# Patient Record
Sex: Female | Born: 1950 | Race: White | Hispanic: No | Marital: Married | Smoking: Never smoker
Health system: Southern US, Community
[De-identification: ages and names within clinical notes are randomized; demographics above are authoritative.]

## PROBLEM LIST (undated history)

## (undated) DIAGNOSIS — M797 Fibromyalgia: Secondary | ICD-10-CM

## (undated) DIAGNOSIS — I951 Orthostatic hypotension: Secondary | ICD-10-CM

## (undated) DIAGNOSIS — I639 Cerebral infarction, unspecified: Secondary | ICD-10-CM

## (undated) DIAGNOSIS — I10 Essential (primary) hypertension: Secondary | ICD-10-CM

## (undated) DIAGNOSIS — Z9889 Other specified postprocedural states: Secondary | ICD-10-CM

## (undated) DIAGNOSIS — G459 Transient cerebral ischemic attack, unspecified: Secondary | ICD-10-CM

## (undated) DIAGNOSIS — M1990 Unspecified osteoarthritis, unspecified site: Secondary | ICD-10-CM

## (undated) DIAGNOSIS — Z9289 Personal history of other medical treatment: Secondary | ICD-10-CM

## (undated) DIAGNOSIS — K649 Unspecified hemorrhoids: Secondary | ICD-10-CM

## (undated) DIAGNOSIS — E669 Obesity, unspecified: Secondary | ICD-10-CM

## (undated) DIAGNOSIS — K635 Polyp of colon: Secondary | ICD-10-CM

## (undated) DIAGNOSIS — H8110 Benign paroxysmal vertigo, unspecified ear: Secondary | ICD-10-CM

## (undated) DIAGNOSIS — E785 Hyperlipidemia, unspecified: Secondary | ICD-10-CM

## (undated) DIAGNOSIS — R112 Nausea with vomiting, unspecified: Secondary | ICD-10-CM

## (undated) DIAGNOSIS — K259 Gastric ulcer, unspecified as acute or chronic, without hemorrhage or perforation: Secondary | ICD-10-CM

## (undated) DIAGNOSIS — F32A Depression, unspecified: Secondary | ICD-10-CM

## (undated) DIAGNOSIS — E119 Type 2 diabetes mellitus without complications: Secondary | ICD-10-CM

## (undated) DIAGNOSIS — Q992 Fragile X chromosome: Secondary | ICD-10-CM

## (undated) DIAGNOSIS — R569 Unspecified convulsions: Secondary | ICD-10-CM

## (undated) DIAGNOSIS — F329 Major depressive disorder, single episode, unspecified: Secondary | ICD-10-CM

## (undated) DIAGNOSIS — G119 Hereditary ataxia, unspecified: Secondary | ICD-10-CM

## (undated) DIAGNOSIS — J189 Pneumonia, unspecified organism: Secondary | ICD-10-CM

## (undated) DIAGNOSIS — F419 Anxiety disorder, unspecified: Secondary | ICD-10-CM

## (undated) DIAGNOSIS — N6019 Diffuse cystic mastopathy of unspecified breast: Secondary | ICD-10-CM

## (undated) HISTORY — DX: Hereditary ataxia, unspecified: G11.9

## (undated) HISTORY — DX: Polyp of colon: K63.5

## (undated) HISTORY — DX: Hyperlipidemia, unspecified: E78.5

## (undated) HISTORY — DX: Fibromyalgia: M79.7

## (undated) HISTORY — DX: Pneumonia, unspecified organism: J18.9

## (undated) HISTORY — DX: Other specified postprocedural states: Z98.890

## (undated) HISTORY — DX: Anxiety disorder, unspecified: F41.9

## (undated) HISTORY — DX: Depression, unspecified: F32.A

## (undated) HISTORY — DX: Major depressive disorder, single episode, unspecified: F32.9

## (undated) HISTORY — DX: Personal history of other medical treatment: Z92.89

## (undated) HISTORY — DX: Obesity, unspecified: E66.9

## (undated) HISTORY — PX: BREAST SURGERY: SHX581

## (undated) HISTORY — DX: Unspecified hemorrhoids: K64.9

## (undated) HISTORY — DX: Benign paroxysmal vertigo, unspecified ear: H81.10

## (undated) HISTORY — DX: Nausea with vomiting, unspecified: R11.2

## (undated) HISTORY — PX: FRACTURE SURGERY: SHX138

## (undated) HISTORY — PX: ABDOMINAL HYSTERECTOMY: SHX81

## (undated) HISTORY — DX: Gastric ulcer, unspecified as acute or chronic, without hemorrhage or perforation: K25.9

## (undated) HISTORY — DX: Unspecified osteoarthritis, unspecified site: M19.90

## (undated) HISTORY — DX: Transient cerebral ischemic attack, unspecified: G45.9

## (undated) HISTORY — PX: MASTECTOMY: SHX3

## (undated) HISTORY — DX: Essential (primary) hypertension: I10

---
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
## 2006-08-24 ENCOUNTER — Emergency Department (HOSPITAL_COMMUNITY): Admission: EM | Admit: 2006-08-24 | Discharge: 2006-08-24 | Payer: Self-pay | Admitting: Emergency Medicine

## 2010-08-24 ENCOUNTER — Emergency Department (INDEPENDENT_AMBULATORY_CARE_PROVIDER_SITE_OTHER): Payer: BC Managed Care – PPO

## 2010-08-24 ENCOUNTER — Emergency Department (HOSPITAL_BASED_OUTPATIENT_CLINIC_OR_DEPARTMENT_OTHER)
Admission: EM | Admit: 2010-08-24 | Discharge: 2010-08-24 | Disposition: A | Discharge status: Home / Self Care | Payer: BC Managed Care – PPO | Attending: Emergency Medicine | Admitting: Emergency Medicine

## 2010-08-24 DIAGNOSIS — R42 Dizziness and giddiness: Secondary | ICD-10-CM | POA: Insufficient documentation

## 2010-08-24 DIAGNOSIS — R197 Diarrhea, unspecified: Secondary | ICD-10-CM

## 2010-08-24 DIAGNOSIS — R5381 Other malaise: Secondary | ICD-10-CM | POA: Insufficient documentation

## 2010-08-24 DIAGNOSIS — R109 Unspecified abdominal pain: Secondary | ICD-10-CM | POA: Insufficient documentation

## 2010-08-24 DIAGNOSIS — R112 Nausea with vomiting, unspecified: Secondary | ICD-10-CM | POA: Insufficient documentation

## 2010-08-24 DIAGNOSIS — R10816 Epigastric abdominal tenderness: Secondary | ICD-10-CM | POA: Insufficient documentation

## 2010-08-24 DIAGNOSIS — M549 Dorsalgia, unspecified: Secondary | ICD-10-CM | POA: Insufficient documentation

## 2010-08-24 DIAGNOSIS — E860 Dehydration: Secondary | ICD-10-CM | POA: Insufficient documentation

## 2010-08-24 DIAGNOSIS — Z79899 Other long term (current) drug therapy: Secondary | ICD-10-CM | POA: Insufficient documentation

## 2010-08-24 DIAGNOSIS — G40909 Epilepsy, unspecified, not intractable, without status epilepticus: Secondary | ICD-10-CM | POA: Insufficient documentation

## 2010-08-24 DIAGNOSIS — K5289 Other specified noninfective gastroenteritis and colitis: Secondary | ICD-10-CM | POA: Insufficient documentation

## 2010-08-24 LAB — DIFFERENTIAL
Basophils Absolute: 0 10*3/uL (ref 0.0–0.1)
Basophils Relative: 0 % (ref 0–1)
Eosinophils Absolute: 0 10*3/uL (ref 0.0–0.7)
Eosinophils Relative: 0 % (ref 0–5)
Lymphocytes Relative: 6 % — ABNORMAL LOW (ref 12–46)

## 2010-08-24 LAB — POTASSIUM: Potassium: 5 mEq/L (ref 3.5–5.1)

## 2010-08-24 LAB — CBC
MCHC: 33.5 g/dL (ref 30.0–36.0)
Platelets: 210 10*3/uL (ref 150–400)
RDW: 13.5 % (ref 11.5–15.5)
WBC: 9 10*3/uL (ref 4.0–10.5)

## 2010-08-24 LAB — BASIC METABOLIC PANEL
Calcium: 8.8 mg/dL (ref 8.4–10.5)
GFR calc Af Amer: >60 mL/min (ref >60)
GFR calc non Af Amer: >60 mL/min (ref >60)
Potassium: 5.6 mEq/L — ABNORMAL HIGH (ref 3.5–5.1)
Sodium: 142 mEq/L (ref 135–145)

## 2011-02-27 DIAGNOSIS — M549 Dorsalgia, unspecified: Secondary | ICD-10-CM | POA: Insufficient documentation

## 2011-06-19 ENCOUNTER — Encounter: Payer: Self-pay | Admitting: *Deleted

## 2011-06-19 ENCOUNTER — Emergency Department (HOSPITAL_BASED_OUTPATIENT_CLINIC_OR_DEPARTMENT_OTHER)
Admission: EM | Admit: 2011-06-19 | Discharge: 2011-06-19 | Disposition: A | Discharge status: Home / Self Care | Payer: Medicare Other | Attending: Emergency Medicine | Admitting: Emergency Medicine

## 2011-06-19 DIAGNOSIS — R21 Rash and other nonspecific skin eruption: Secondary | ICD-10-CM | POA: Insufficient documentation

## 2011-06-19 DIAGNOSIS — Z79899 Other long term (current) drug therapy: Secondary | ICD-10-CM | POA: Insufficient documentation

## 2011-06-19 DIAGNOSIS — D689 Coagulation defect, unspecified: Secondary | ICD-10-CM | POA: Insufficient documentation

## 2011-06-19 DIAGNOSIS — K130 Diseases of lips: Secondary | ICD-10-CM

## 2011-06-19 HISTORY — DX: Unspecified convulsions: R56.9

## 2011-06-19 MED ORDER — SILVER NITRATE-POT NITRATE 75-25 % EX MISC
CUTANEOUS | Status: AC
  Administered 2011-06-19: 1 via TOPICAL
  Filled 2011-06-19: qty 1

## 2011-06-19 MED ORDER — SILVER NITRATE-POT NITRATE 75-25 % EX MISC
1.0000 | Freq: Once | CUTANEOUS | Status: AC
  Administered 2011-06-19: 1 via TOPICAL

## 2011-06-19 NOTE — ED Provider Notes (Signed)
History     CSN: [id number]  Arrival date & time 06/19/11  2017   First MD Initiated Contact with Patient 06/19/11 2102      Chief Complaint  Patient presents with  . Coagulation Disorder    (Consider location/radiation/quality/duration/timing/severity/associated sxs/prior treatment) Patient is a 60 y.o. female presenting with mouth sores. The history is provided by the patient. No language interpreter was used.  Mouth Lesions  The current episode started today. The onset was sudden. The problem occurs occasionally. The problem has been unchanged. The problem is moderate. Associated symptoms include mouth sores, URI and rash.  Pt reports she has a blood vessel on her lip that bleeds on and off.  Pt has seen dermatologist for the same.    Past Medical History  Diagnosis Date  . Seizures     Past Surgical History  Procedure Date  . Abdominal hysterectomy   . Fracture surgery     No family history on file.  History  Substance Use Topics  . Smoking status: Never Smoker   . Smokeless tobacco: Not on file  . Alcohol Use: No    OB History    Grav Para Term Preterm Abortions TAB SAB Ect Mult Living                  Review of Systems  HENT: Positive for mouth sores.   Skin: Positive for rash.  All other systems reviewed and are negative.    Allergies  Demerol; Dilantin; Morphine and related; and Talwin  Home Medications   Current Outpatient Rx  Name Route Sig Dispense Refill  . ATORVASTATIN CALCIUM 80 MG PO TABS Oral Take 80 mg by mouth daily.      [name] CLONAZEPAM 1 MG PO TABS Oral Take 1 mg by mouth 3 (three) times daily as needed.      [name] ESCITALOPRAM OXALATE 10 MG PO TABS Oral Take 10 mg by mouth daily.      [name] METOPROLOL TARTRATE 25 MG PO TABS Oral Take 25 mg by mouth 2 (two) times daily.      [name] PANTOPRAZOLE SODIUM 20 MG PO TBEC Oral Take 20 mg by mouth daily.      [name] PHENOBARBITAL 100 MG PO TABS Oral Take 100 mg by mouth 2 (two) times daily.        BP 133/83   Pulse 80  Temp(Src) 97.9 F (36.6 C) (Oral)  Resp 18  Ht 5' 4.5" (1.638 m)  Wt 188 lb (85.276 kg)  BMI 31.77 kg/m2  SpO2 100%  Physical Exam  Nursing note and vitals reviewed. Constitutional: She appears well-developed and well-nourished.  HENT:  Head: Normocephalic and atraumatic.  Right Ear: External ear normal.  Left Ear: External ear normal.  Eyes: Conjunctivae and EOM are normal. Pupils are equal, round, and reactive to light.  Neck: Normal range of motion. Neck supple.  Neurological: She is alert.  Skin: There is erythema.  Psychiatric: She has a normal mood and affect.    ED Course  Procedures (including critical care time)  Labs Reviewed - No data to display No results found.   No diagnosis found.    MDM  Tiny dark blood blood vessel lower lip,  (tiny varicose vein)  Oozing,  Silver nitrate to area.  Bleeding resolved        Langston Masker, [place] 06/19/11 2121

## 2011-06-19 NOTE — ED Notes (Signed)
Pt presents to ED today with bleeding lip.  Pt has hx of same.  Pt reports has "open blood vessel" on lower lip and was treated at dermatologist for same. Pt was eating supper when sx started tonight

## 2011-06-20 ENCOUNTER — Encounter (HOSPITAL_BASED_OUTPATIENT_CLINIC_OR_DEPARTMENT_OTHER): Payer: Self-pay | Admitting: Emergency Medicine

## 2011-06-20 ENCOUNTER — Emergency Department (HOSPITAL_BASED_OUTPATIENT_CLINIC_OR_DEPARTMENT_OTHER)
Admission: EM | Admit: 2011-06-20 | Discharge: 2011-06-21 | Disposition: A | Discharge status: Home / Self Care | Payer: Medicare Other | Attending: Emergency Medicine | Admitting: Emergency Medicine

## 2011-06-20 DIAGNOSIS — Z79899 Other long term (current) drug therapy: Secondary | ICD-10-CM | POA: Insufficient documentation

## 2011-06-20 DIAGNOSIS — K130 Diseases of lips: Secondary | ICD-10-CM | POA: Insufficient documentation

## 2011-06-20 MED ORDER — HYDROCODONE-ACETAMINOPHEN 5-325 MG PO TABS
1.0000 | ORAL_TABLET | Freq: Once | ORAL | Status: AC
  Administered 2011-06-20: 1 via ORAL
  Filled 2011-06-20: qty 1

## 2011-06-20 NOTE — ED Notes (Signed)
Pt has lesion on lip with continued bleeding. Pt was seen here last night for same

## 2011-06-20 NOTE — ED Provider Notes (Signed)
Medical screening examination/treatment/procedure(s) were performed by non-physician practitioner and as supervising physician I was immediately available for consultation/collaboration.  Juliet Rude. Rubin Payor, MD 06/20/11 [phone]

## 2011-06-21 MED ORDER — SILVER NITRATE-POT NITRATE 75-25 % EX MISC
1.0000 | Freq: Once | CUTANEOUS | Status: AC
  Administered 2011-06-21: 1 via TOPICAL
  Filled 2011-06-21: qty 1

## 2011-06-21 NOTE — ED Provider Notes (Signed)
History     CSN: [id number]  Arrival date & time 06/20/11  2223   First MD Initiated Contact with Patient 06/20/11 2312      Chief Complaint  Patient presents with  . Lip Laceration    (Consider location/radiation/quality/duration/timing/severity/associated sxs/prior treatment) HPI  60yoF presents with bleeding from her lip. Patient states that she seen here for same yesterday. Silver nitrate was applied at that time. She states that today limited to bleeding. She's had a problem with this in the past. She states that she had a small lesion on her lip that was excised and cauterized by dermatology. She states that there was some abnormality in the blood vessel at that time. She's had no problems since the original procedure until yesterday. She denies any trauma to the lip. She complains of mild frontal headache. She states she has not eaten. She denies numbness, tingling, weakness of her injuries. The headache is to 10 at this time. There is a gradual onset and present for 2-3 days. Denies fever, chills, neck stiffness.  Past Medical History  Diagnosis Date  . Seizures     Past Surgical History  Procedure Date  . Abdominal hysterectomy   . Fracture surgery     No family history on file.  History  Substance Use Topics  . Smoking status: Never Smoker   . Smokeless tobacco: Not on file  . Alcohol Use: No    OB History    Grav Para Term Preterm Abortions TAB SAB Ect Mult Living                  Review of Systems  All other systems reviewed and are negative.   except as noted HPI   Allergies  Talwin; Demerol; Dilantin; Dilaudid; and Morphine and related  Home Medications   Current Outpatient Rx  Name Route Sig Dispense Refill  . ACETAMINOPHEN 500 MG PO TABS Oral Take 500 mg by mouth every 6 (six) hours as needed. For pain     . ATORVASTATIN CALCIUM 80 MG PO TABS Oral Take 80 mg by mouth daily.      [name] CLONAZEPAM 1 MG PO TABS Oral Take 1 mg by mouth 3 (three)  times daily as needed. For anxiety    . ESCITALOPRAM OXALATE 10 MG PO TABS Oral Take 10 mg by mouth daily.      [name] METOPROLOL SUCCINATE ER 25 MG PO TB24 Oral Take 25 mg by mouth at bedtime.      [name] PANTOPRAZOLE SODIUM 20 MG PO TBEC Oral Take 20 mg by mouth daily.      [name] PHENOBARBITAL 100 MG PO TABS Oral Take 100 mg by mouth at bedtime.       BP 111/70  Pulse 98  Temp(Src) 97.8 F (36.6 C) (Oral)  Resp 18  SpO2 98%  Physical Exam  Nursing note and vitals reviewed. Constitutional: She is oriented to person, place, and time. She appears well-developed.  HENT:  Head: Atraumatic.  Mouth/Throat: Oropharynx is clear and moist.  Eyes: Conjunctivae and EOM are normal. Pupils are equal, round, and reactive to light.  Neck: Normal range of motion. Neck supple.  Cardiovascular: Normal rate, regular rhythm, normal heart sounds and intact distal pulses.   Pulmonary/Chest: Effort normal and breath sounds normal. No respiratory distress. She has no wheezes. She has no rales.  Abdominal: Soft. She exhibits no distension. There is no tenderness. There is no rebound and no guarding.  Musculoskeletal: Normal range of motion.  Neurological: She is alert and oriented to person, place, and time.  Skin: Skin is warm and dry. No rash noted.       Lt lower lip with .3cm bleeding lesions. Min amt of oozing noted  Psychiatric: She has a normal mood and affect.    ED Course  Cauterization Date/Time: 06/21/2011 12:50 AM Performed by: Forbes Cellar Authorized by: Forbes Cellar Consent: Verbal consent obtained. Consent given by: patient Patient understanding: patient states understanding of the procedure being performed Patient consent: the patient's understanding of the procedure matches consent given Patient identity confirmed: arm band Time out: Immediately prior to procedure a "time out" was called to verify the correct patient, procedure, equipment, support staff and site/side marked as  required. Local anesthesia used: no Patient sedated: no Patient tolerance: Patient tolerated the procedure well with no immediate complications. Comments: Silver nitrate applied to oozing wound with good control of bleeding   (including critical care time)  Labs Reviewed - No data to display No results found.   1. Lip lesion     MDM  H/o vessel abnormality of lip pw repeated min amt of bleeding. Chemical cautery. She will f/u with her dermatologist for further w/u and evaluation. Headache slightly improved with norco. To take tylenol at home for same. Precautions for return        Forbes Cellar, MD 06/21/11 [phone]

## 2012-08-14 ENCOUNTER — Emergency Department (HOSPITAL_BASED_OUTPATIENT_CLINIC_OR_DEPARTMENT_OTHER)
Admission: EM | Admit: 2012-08-14 | Discharge: 2012-08-15 | Disposition: A | Discharge status: Home / Self Care | Payer: Medicare Other | Attending: Emergency Medicine | Admitting: Emergency Medicine

## 2012-08-14 ENCOUNTER — Emergency Department (HOSPITAL_BASED_OUTPATIENT_CLINIC_OR_DEPARTMENT_OTHER): Payer: Medicare Other

## 2012-08-14 ENCOUNTER — Encounter (HOSPITAL_BASED_OUTPATIENT_CLINIC_OR_DEPARTMENT_OTHER): Payer: Self-pay | Admitting: Emergency Medicine

## 2012-08-14 DIAGNOSIS — R0989 Other specified symptoms and signs involving the circulatory and respiratory systems: Secondary | ICD-10-CM | POA: Insufficient documentation

## 2012-08-14 DIAGNOSIS — Z79899 Other long term (current) drug therapy: Secondary | ICD-10-CM | POA: Insufficient documentation

## 2012-08-14 DIAGNOSIS — R110 Nausea: Secondary | ICD-10-CM | POA: Insufficient documentation

## 2012-08-14 DIAGNOSIS — E119 Type 2 diabetes mellitus without complications: Secondary | ICD-10-CM | POA: Insufficient documentation

## 2012-08-14 DIAGNOSIS — R569 Unspecified convulsions: Secondary | ICD-10-CM | POA: Insufficient documentation

## 2012-08-14 DIAGNOSIS — R0609 Other forms of dyspnea: Secondary | ICD-10-CM | POA: Insufficient documentation

## 2012-08-14 DIAGNOSIS — R509 Fever, unspecified: Secondary | ICD-10-CM | POA: Insufficient documentation

## 2012-08-14 DIAGNOSIS — B9789 Other viral agents as the cause of diseases classified elsewhere: Secondary | ICD-10-CM | POA: Insufficient documentation

## 2012-08-14 DIAGNOSIS — R5381 Other malaise: Secondary | ICD-10-CM | POA: Insufficient documentation

## 2012-08-14 DIAGNOSIS — R059 Cough, unspecified: Secondary | ICD-10-CM | POA: Insufficient documentation

## 2012-08-14 DIAGNOSIS — R61 Generalized hyperhidrosis: Secondary | ICD-10-CM | POA: Insufficient documentation

## 2012-08-14 DIAGNOSIS — IMO0001 Reserved for inherently not codable concepts without codable children: Secondary | ICD-10-CM | POA: Insufficient documentation

## 2012-08-14 HISTORY — DX: Type 2 diabetes mellitus without complications: E11.9

## 2012-08-14 LAB — CBC WITH DIFFERENTIAL/PLATELET
Basophils Absolute: 0 10*3/uL (ref 0.0–0.1)
Eosinophils Relative: 1 % (ref 0–5)
HCT: 46.3 % — ABNORMAL HIGH (ref 36.0–46.0)
Hemoglobin: 15.2 g/dL — ABNORMAL HIGH (ref 12.0–15.0)
Lymphocytes Relative: 12 % (ref 12–46)
MCV: 85.7 fL (ref 78.0–100.0)
Monocytes Absolute: 0.7 10*3/uL (ref 0.1–1.0)
Monocytes Relative: 11 % (ref 3–12)
Neutro Abs: 4.9 10*3/uL (ref 1.7–7.7)
RDW: 14.2 % (ref 11.5–15.5)
WBC: 6.5 10*3/uL (ref 4.0–10.5)

## 2012-08-14 LAB — BASIC METABOLIC PANEL
BUN: 15 mg/dL (ref 6–23)
CO2: 25 mEq/L (ref 19–32)
Calcium: 9 mg/dL (ref 8.4–10.5)
Chloride: 104 mEq/L (ref 96–112)
Creatinine, Ser: 0.9 mg/dL (ref 0.50–1.10)
Glucose, Bld: 112 mg/dL — ABNORMAL HIGH (ref 70–99)

## 2012-08-14 LAB — URINALYSIS, ROUTINE W REFLEX MICROSCOPIC
Bilirubin Urine: NEGATIVE
Nitrite: NEGATIVE
Protein, ur: 30 mg/dL — AB
Specific Gravity, Urine: 1.028 (ref 1.005–1.030)
Urobilinogen, UA: 0.2 mg/dL (ref 0.0–1.0)

## 2012-08-14 LAB — URINE MICROSCOPIC-ADD ON

## 2012-08-14 MED ORDER — ALBUTEROL SULFATE (5 MG/ML) 0.5% IN NEBU
INHALATION_SOLUTION | RESPIRATORY_TRACT | Status: AC
  Filled 2012-08-14: qty 1

## 2012-08-14 MED ORDER — LEVALBUTEROL HCL 1.25 MG/0.5ML IN NEBU
INHALATION_SOLUTION | RESPIRATORY_TRACT | Status: AC
  Administered 2012-08-14: 1.25 mg
  Filled 2012-08-14: qty 0.5

## 2012-08-14 MED ORDER — ONDANSETRON HCL 8 MG PO TABS
ORAL_TABLET | ORAL | Status: AC
  Administered 2012-08-14: 8 mg
  Filled 2012-08-14: qty 1

## 2012-08-14 MED ORDER — IPRATROPIUM BROMIDE 0.02 % IN SOLN
RESPIRATORY_TRACT | Status: AC
  Filled 2012-08-14: qty 2.5

## 2012-08-14 MED ORDER — IPRATROPIUM BROMIDE 0.02 % IN SOLN
RESPIRATORY_TRACT | Status: AC
  Administered 2012-08-14: 0.5 mg
  Filled 2012-08-14: qty 2.5

## 2012-08-14 MED ORDER — SODIUM CHLORIDE 0.9 % IV BOLUS (SEPSIS): 1000.0000 mL | Freq: Once | INTRAVENOUS | Status: DC

## 2012-08-14 MED ORDER — ASPIRIN 81 MG PO CHEW
162.0000 mg | CHEWABLE_TABLET | Freq: Once | ORAL | Status: AC
  Administered 2012-08-14: 162 mg via ORAL
  Filled 2012-08-14: qty 1

## 2012-08-14 NOTE — ED Notes (Signed)
I placed patient on wall monitor per nurse request. O2 stats showed 93-95% room air, so I placed a nasal canula on 2 ltrs. O2 and notified respiratory-Sarah.

## 2012-08-14 NOTE — ED Notes (Addendum)
Pt c/o generalized body aches and fever with cough and severe SOB onset 4 days ago and has progressively worsen denies chest pain currently taking Z-pack for same

## 2012-08-14 NOTE — ED Notes (Signed)
MD at bedside. 

## 2012-08-14 NOTE — ED Provider Notes (Addendum)
History    This chart was scribed for Derwood Kaplan, MD by Leone Payor, ED Scribe. This patient was seen in room MH12/[facility]12 and the patient's care was started 8:40 PM.   CSN: [id number]  Arrival date & time 08/14/12  2017   First MD Initiated Contact with Patient 08/14/12 2036      Chief Complaint  Patient presents with  . Shortness of Breath  . Cough  . Fatigue     The history is provided by the patient. No language interpreter was used.    Brandi Ramsey is a 62 y.o. female who presents to the Emergency Department complaining of ongoing, constant, unchanged SOB with associated generalized body aches, fever and cough productive of yellow/green sputum starting 5 days ago. Started taking Z-pack 4 days ago with no relief. Pt states 4 days ago she reports bending down and falling over nearly passing out. She also has associated nausea, chills, hot flashes. She denies vomiting, chest pain. Pt denies any heart problems or h/o lung disease or COPD. Denies h/o cancer, blood clots in legs or lungs. No recent surgeries or travels in the last 6 weeks. Pt reports using 3 pillows to sleep on.     Pt has h/o seizures, DM,  Pt is a current everyday smoker and occasional alcohol user.  Past Medical History  Diagnosis Date  . Seizures   . Diabetes mellitus without complication     Past Surgical History  Procedure Laterality Date  . Abdominal hysterectomy    . Fracture surgery      History reviewed. No pertinent family history.  History  Substance Use Topics  . Smoking status: Never Smoker   . Smokeless tobacco: Not on file  . Alcohol Use: No    No OB history provided.   Review of Systems  Constitutional: Positive for fever, chills, diaphoresis and fatigue.  HENT: Negative.   Eyes: Negative.   Respiratory: Positive for cough.   Cardiovascular: Negative.  Negative for chest pain.  Gastrointestinal: Positive for nausea. Negative for vomiting.  Musculoskeletal: Positive for myalgias.   Neurological: Negative.   Psychiatric/Behavioral: Negative.   All other systems reviewed and are negative.    Allergies  Pentazocine lactate; Demerol; Dilaudid; Morphine and related; and Phenytoin sodium extended  Home Medications   Current Outpatient Rx  Name  Route  Sig  Dispense  Refill  . acetaminophen (TYLENOL) 500 MG tablet   Oral   Take 500 mg by mouth every 6 (six) hours as needed. For pain          . atorvastatin (LIPITOR) 80 MG tablet   Oral   Take 80 mg by mouth daily.           . clonazePAM (KLONOPIN) 1 MG tablet   Oral   Take 1 mg by mouth 3 (three) times daily as needed. For anxiety         . escitalopram (LEXAPRO) 10 MG tablet   Oral   Take 10 mg by mouth daily.           . metoprolol succinate (TOPROL-XL) 25 MG 24 hr tablet   Oral   Take 25 mg by mouth at bedtime.           . pantoprazole (PROTONIX) 20 MG tablet   Oral   Take 20 mg by mouth daily.           [name] PHENObarbital (LUMINAL) 100 MG tablet   Oral   Take 100 mg by mouth  at bedtime.            BP 128/28  Pulse 129  Temp(Src) 99 F (37.2 C) (Oral)  SpO2 96%  Physical Exam  Nursing note and vitals reviewed. Constitutional: She is oriented to person, place, and time. She appears well-developed and well-nourished. No distress.  HENT:  Head: Normocephalic and atraumatic.  Eyes: EOM are normal.  Neck: Neck supple. No JVD present. No tracheal deviation present.  Cardiovascular: Regular rhythm and normal heart sounds.  Tachycardia present.   Pulmonary/Chest: Effort normal and breath sounds normal. No respiratory distress. She has no wheezes. She has no rales.  Abdominal: Soft. She exhibits no distension. There is no tenderness.  Musculoskeletal: Normal range of motion. She exhibits no edema.  Lower extremity has no pitting edema or unilateral swelling.  Neurological: She is alert and oriented to person, place, and time.  Skin: Skin is warm and dry.  Psychiatric: She has a  normal mood and affect. Her behavior is normal.    ED Course  Procedures (including critical care time)  DIAGNOSTIC STUDIES: Oxygen Saturation is 96% on room air, adequate by my interpretation.    COORDINATION OF CARE: 8:39 PM Discussed treatment plan which includes CXR, UA, CBC panel, basic metabolic panel, troponin I with pt at bedside and pt agreed to plan.    Labs Reviewed  URINALYSIS, ROUTINE W REFLEX MICROSCOPIC - Abnormal; Notable for the following:    Hgb urine dipstick TRACE (*)    Ketones, ur 15 (*)    Protein, ur 30 (*)    All other components within normal limits  URINE MICROSCOPIC-ADD ON - Abnormal; Notable for the following:    Squamous Epithelial / LPF FEW (*)    Bacteria, UA FEW (*)    All other components within normal limits   No results found.   No diagnosis found.    MDM  I personally performed the services described in this documentation, which was scribed in my presence. The recorded information has been reviewed and is accurate.   Date: 08/14/2012  Rate: 108  Rhythm: sinus tachycardia  QRS Axis: normal  Intervals: normal  ST/T Wave abnormalities: nonspecific ST/T changes  Conduction Disutrbances:none  Narrative Interpretation:   Old EKG Reviewed: none available S1, q3, t3 seen.  Differential diagnosis includes: ACS syndrome CHF exacerbation Valvular disorder Myocarditis Pericarditis Pericardial effusion Pneumonia Pleural effusion Pulmonary edema PE Anemia PE   Pt comes in with cc of dib. Pt noted to be tachycardic. She is on AZT - presumably for bronchitis, and some wheezing was appreciated at arrival by the RNs and RTs.  Plan is to get basic labs and CXR. If there is no PNA -  And patient continues to be tachycardic - we will get dimer. Her Wells score is 1.5 for tachycardia.   Pt had a near syncope/syncopal episode of Monday - since then fine. EKG is not showing any conduction delays or pathology, no repeat incident - so we  will not be further concerned about the near syncope in this visit.    Derwood Kaplan, MD 08/14/12 1478  Derwood Kaplan, MD 08/14/12 2356

## 2012-08-15 MED ORDER — DOXYCYCLINE HYCLATE 100 MG PO TABS
100.0000 mg | ORAL_TABLET | Freq: Once | ORAL | Status: AC
  Administered 2012-08-15: 100 mg via ORAL
  Filled 2012-08-15: qty 1

## 2012-08-15 MED ORDER — ACETAMINOPHEN 325 MG PO TABS
ORAL_TABLET | ORAL | Status: AC
  Administered 2012-08-15: 650 mg
  Filled 2012-08-15: qty 2

## 2012-08-15 MED ORDER — IOHEXOL 350 MG/ML SOLN
100.0000 mL | Freq: Once | INTRAVENOUS | Status: AC | PRN
  Administered 2012-08-15: 100 mL via INTRAVENOUS

## 2012-08-15 MED ORDER — AZITHROMYCIN 250 MG PO TABS: ORAL_TABLET | ORAL | Status: DC

## 2012-08-15 MED ORDER — ALBUTEROL SULFATE HFA 108 (90 BASE) MCG/ACT IN AERS: 1.0000 | INHALATION_SPRAY | Freq: Four times a day (QID) | RESPIRATORY_TRACT | Status: DC | PRN

## 2012-08-15 MED ORDER — DOXYCYCLINE HYCLATE 100 MG PO CAPS: 100.0000 mg | ORAL_CAPSULE | Freq: Two times a day (BID) | ORAL | Status: DC

## 2012-08-15 NOTE — ED Provider Notes (Signed)
Nursing notes and vitals signs, including pulse oximetry, reviewed.  Summary of this visit's results, reviewed by myself:  Labs:  Results for orders placed during the hospital encounter of 08/14/12 (from the past 24 hour(s))  URINALYSIS, ROUTINE W REFLEX MICROSCOPIC     Status: Abnormal   Collection Time    08/14/12  9:05 PM      Result Value Range   Color, Urine YELLOW  YELLOW   APPearance CLEAR  CLEAR   Specific Gravity, Urine 1.028  1.005 - 1.030   pH 5.5  5.0 - 8.0   Glucose, UA NEGATIVE  NEGATIVE mg/dL   Hgb urine dipstick TRACE (*) NEGATIVE   Bilirubin Urine NEGATIVE  NEGATIVE   Ketones, ur 15 (*) NEGATIVE mg/dL   Protein, ur 30 (*) NEGATIVE mg/dL   Urobilinogen, UA 0.2  0.0 - 1.0 mg/dL   Nitrite NEGATIVE  NEGATIVE   Leukocytes, UA NEGATIVE  NEGATIVE  URINE MICROSCOPIC-ADD ON     Status: Abnormal   Collection Time    08/14/12  9:05 PM      Result Value Range   Squamous Epithelial / LPF FEW (*) RARE   WBC, UA 0-2  <3 WBC/hpf   RBC / HPF 0-2  <3 RBC/hpf   Bacteria, UA FEW (*) RARE   Urine-Other MUCOUS PRESENT    D-DIMER, QUANTITATIVE     Status: Abnormal   Collection Time    08/14/12  9:27 PM      Result Value Range   D-Dimer, Quant 0.62 (*) 0.00 - 0.48 ug/mL-FEU  CBC WITH DIFFERENTIAL     Status: Abnormal   Collection Time    08/14/12 10:05 PM      Result Value Range   WBC 6.5  4.0 - 10.5 K/uL   RBC 5.40 (*) 3.87 - 5.11 MIL/uL   Hemoglobin 15.2 (*) 12.0 - 15.0 g/dL   HCT 96.0 (*) 45.4 - 09.8 %   MCV 85.7  78.0 - 100.0 fL   MCH 28.1  26.0 - 34.0 pg   MCHC 32.8  30.0 - 36.0 g/dL   RDW 11.9  14.7 - 82.9 %   Platelets 191  150 - 400 K/uL   Neutrophils Relative 76  43 - 77 %   Neutro Abs 4.9  1.7 - 7.7 K/uL   Lymphocytes Relative 12  12 - 46 %   Lymphs Abs 0.8  0.7 - 4.0 K/uL   Monocytes Relative 11  3 - 12 %   Monocytes Absolute 0.7  0.1 - 1.0 K/uL   Eosinophils Relative 1  0 - 5 %   Eosinophils Absolute 0.1  0.0 - 0.7 K/uL   Basophils Relative 0  0 - 1 %   Basophils Absolute 0.0  0.0 - 0.1 K/uL  BASIC METABOLIC PANEL     Status: Abnormal   Collection Time    08/14/12 10:05 PM      Result Value Range   Sodium 141  135 - 145 mEq/L   Potassium 3.7  3.5 - 5.1 mEq/L   Chloride 104  96 - 112 mEq/L   CO2 25  19 - 32 mEq/L   Glucose, Bld 112 (*) 70 - 99 mg/dL   BUN 15  6 - 23 mg/dL   Creatinine, Ser 5.62  0.50 - 1.10 mg/dL   Calcium 9.0  8.4 - 13.0 mg/dL   GFR calc non Af Amer 68 (*) >90 mL/min   GFR calc Af Amer 79 (*) >90 mL/min  TROPONIN I     Status: None   Collection Time    08/14/12 10:05 PM      Result Value Range   Troponin I <0.30  <0.30 ng/mL    Imaging Studies: Dg Chest 2 View  08/14/2012  *RADIOLOGY REPORT*  Clinical Data: Cough.  Shortness of breath.  Fever.  Body aches.  CHEST - 2 VIEW  Comparison: None.  Findings: Shallow inspiration with mild elevation of the right hemidiaphragm.  The The heart size and pulmonary vascularity are normal. The lungs appear clear and expanded without focal air space disease or consolidation. No blunting of the costophrenic angles. No pneumothorax.  Mediastinal contours appear intact.  IMPRESSION: No evidence of active pulmonary disease.   Original Report Authenticated By: Burman Nieves, M.D.    Ct Angio Chest Pe W/cm &/or Wo Cm  08/15/2012  *RADIOLOGY REPORT*  Clinical Data: Shortness of breath.  Cough.  Evaluate for pulmonary embolism.  CT ANGIOGRAPHY CHEST  Technique:  Multidetector CT imaging of the chest using the standard protocol during bolus administration of intravenous contrast. Multiplanar reconstructed images including MIPs were obtained and reviewed to evaluate the vascular anatomy.  Contrast: OMNIPAQUE IOHEXOL 350 MG/ML SOLN  Comparison: No priors.  Findings:  Mediastinum: There are no filling defects within the pulmonary arterial tree to suggest underlying pulmonary embolism. Heart size is normal. There is no significant pericardial fluid, thickening or pericardial calcification.   Numerous borderline enlarged mediastinal and hilar lymph nodes are conspicuous but their number rather than their size.  The largest single lymph node measures 9 mm in short axis in the subcarinal station.  Small hiatal hernia.  Lungs/Pleura: Linear scarring or subsegmental atelectasis in the inferior segment of the lingula.  There is a 6 mm nodule along the left major fissure (image 38 of series six).  Multiple additional small nodules are noted in a peribronchovascular distribution in the periphery of the left lower lobe with some surrounding ground- glass attenuation.  There is some bronchial wall thickening in this region, and to a lesser extent throughout the remainder of the lungs bilaterally.  No pleural effusions.  Upper Abdomen: Subcentimeter low attenuation lesion and segment 5 of the liver is too small to definitively characterize, but statistically likely a tiny cyst.  Otherwise, unremarkable.  Musculoskeletal: There are no aggressive appearing lytic or blastic lesions noted in the visualized portions of the skeleton.  IMPRESSION: 1.  No evidence of pulmonary embolism. 2.  Ground-glass attenuation air space disease with extensive peribronchovascular nodularity in the left lower lobe. Mild diffuse bronchial wall thickening, most severe in the left lower lobe. Overall, findings are favored to reflect a bronchitis with left lower lobe bronchopneumonia.  Numerous borderline enlarged mediastinal and bilateral hilar lymph nodes are presumably reactive. 3. 6 mm nodule along the left major fissure (image 38 of series six) appears separate from the process mentioned above. If the patient is at high risk for bronchogenic carcinoma, follow-up chest CT at 6-12 months is recommended.  If the patient is at low risk for bronchogenic carcinoma, follow-up chest CT at 12 months is recommended.  This recommendation follows the consensus statement: Guidelines for Management of Small Pulmonary Nodules Detected on CT Scans: A  Statement from the Fleischner Society as published in Radiology 2005; 237:395-400. 4.  Small hiatal hernia.   Original Report Authenticated By: Trudie Reed, M.D.     Will treat for early pneumonia.  Hanley Seamen, MD 08/15/12 [phone]

## 2012-10-05 ENCOUNTER — Other Ambulatory Visit (HOSPITAL_BASED_OUTPATIENT_CLINIC_OR_DEPARTMENT_OTHER): Payer: Self-pay | Admitting: *Deleted

## 2012-10-05 DIAGNOSIS — R911 Solitary pulmonary nodule: Secondary | ICD-10-CM

## 2012-10-26 ENCOUNTER — Ambulatory Visit (HOSPITAL_BASED_OUTPATIENT_CLINIC_OR_DEPARTMENT_OTHER): Admission: RE | Admit: 2012-10-26 | Payer: Medicare Other | Source: Ambulatory Visit

## 2012-11-27 DIAGNOSIS — F329 Major depressive disorder, single episode, unspecified: Secondary | ICD-10-CM | POA: Insufficient documentation

## 2012-11-27 DIAGNOSIS — H8110 Benign paroxysmal vertigo, unspecified ear: Secondary | ICD-10-CM | POA: Insufficient documentation

## 2012-11-27 DIAGNOSIS — R519 Headache, unspecified: Secondary | ICD-10-CM | POA: Insufficient documentation

## 2012-11-27 DIAGNOSIS — L84 Corns and callosities: Secondary | ICD-10-CM | POA: Insufficient documentation

## 2012-11-27 DIAGNOSIS — M25559 Pain in unspecified hip: Secondary | ICD-10-CM | POA: Insufficient documentation

## 2012-11-27 DIAGNOSIS — H9110 Presbycusis, unspecified ear: Secondary | ICD-10-CM | POA: Insufficient documentation

## 2012-11-27 DIAGNOSIS — I459 Conduction disorder, unspecified: Secondary | ICD-10-CM | POA: Insufficient documentation

## 2012-11-27 DIAGNOSIS — G35 Multiple sclerosis: Secondary | ICD-10-CM | POA: Insufficient documentation

## 2012-11-27 DIAGNOSIS — G119 Hereditary ataxia, unspecified: Secondary | ICD-10-CM | POA: Insufficient documentation

## 2012-11-27 DIAGNOSIS — K449 Diaphragmatic hernia without obstruction or gangrene: Secondary | ICD-10-CM | POA: Insufficient documentation

## 2012-11-27 DIAGNOSIS — L439 Lichen planus, unspecified: Secondary | ICD-10-CM | POA: Insufficient documentation

## 2012-11-27 DIAGNOSIS — R32 Unspecified urinary incontinence: Secondary | ICD-10-CM | POA: Insufficient documentation

## 2012-11-27 DIAGNOSIS — F411 Generalized anxiety disorder: Secondary | ICD-10-CM | POA: Insufficient documentation

## 2012-11-27 DIAGNOSIS — K649 Unspecified hemorrhoids: Secondary | ICD-10-CM | POA: Insufficient documentation

## 2012-11-27 DIAGNOSIS — Q992 Fragile X chromosome: Secondary | ICD-10-CM | POA: Insufficient documentation

## 2012-11-27 DIAGNOSIS — R229 Localized swelling, mass and lump, unspecified: Secondary | ICD-10-CM | POA: Insufficient documentation

## 2012-11-27 DIAGNOSIS — J309 Allergic rhinitis, unspecified: Secondary | ICD-10-CM | POA: Insufficient documentation

## 2012-11-27 DIAGNOSIS — F32A Depression, unspecified: Secondary | ICD-10-CM | POA: Insufficient documentation

## 2012-11-27 DIAGNOSIS — M899 Disorder of bone, unspecified: Secondary | ICD-10-CM | POA: Insufficient documentation

## 2012-12-29 DIAGNOSIS — R5383 Other fatigue: Secondary | ICD-10-CM | POA: Insufficient documentation

## 2012-12-29 DIAGNOSIS — M791 Myalgia, unspecified site: Secondary | ICD-10-CM | POA: Insufficient documentation

## 2012-12-29 DIAGNOSIS — R5381 Other malaise: Secondary | ICD-10-CM | POA: Insufficient documentation

## 2012-12-29 DIAGNOSIS — R252 Cramp and spasm: Secondary | ICD-10-CM | POA: Insufficient documentation

## 2013-04-06 DIAGNOSIS — E559 Vitamin D deficiency, unspecified: Secondary | ICD-10-CM | POA: Insufficient documentation

## 2013-08-26 ENCOUNTER — Observation Stay (HOSPITAL_BASED_OUTPATIENT_CLINIC_OR_DEPARTMENT_OTHER)
Admission: EM | Admit: 2013-08-26 | Discharge: 2013-08-28 | Disposition: A | Discharge status: Home / Self Care | Payer: Medicare Other | Attending: Internal Medicine | Admitting: Internal Medicine

## 2013-08-26 ENCOUNTER — Encounter (HOSPITAL_BASED_OUTPATIENT_CLINIC_OR_DEPARTMENT_OTHER): Payer: Self-pay | Admitting: Emergency Medicine

## 2013-08-26 ENCOUNTER — Emergency Department (HOSPITAL_BASED_OUTPATIENT_CLINIC_OR_DEPARTMENT_OTHER): Payer: Medicare Other

## 2013-08-26 DIAGNOSIS — Q992 Fragile X chromosome: Secondary | ICD-10-CM | POA: Insufficient documentation

## 2013-08-26 DIAGNOSIS — E119 Type 2 diabetes mellitus without complications: Secondary | ICD-10-CM | POA: Insufficient documentation

## 2013-08-26 DIAGNOSIS — G459 Transient cerebral ischemic attack, unspecified: Principal | ICD-10-CM | POA: Insufficient documentation

## 2013-08-26 DIAGNOSIS — R51 Headache: Secondary | ICD-10-CM | POA: Diagnosis present

## 2013-08-26 DIAGNOSIS — R531 Weakness: Secondary | ICD-10-CM | POA: Diagnosis present

## 2013-08-26 DIAGNOSIS — R569 Unspecified convulsions: Secondary | ICD-10-CM | POA: Insufficient documentation

## 2013-08-26 DIAGNOSIS — R29810 Facial weakness: Secondary | ICD-10-CM

## 2013-08-26 DIAGNOSIS — R519 Headache, unspecified: Secondary | ICD-10-CM | POA: Diagnosis present

## 2013-08-26 HISTORY — DX: Fragile x chromosome: Q99.2

## 2013-08-26 LAB — URINALYSIS, ROUTINE W REFLEX MICROSCOPIC
Bilirubin Urine: NEGATIVE
Glucose, UA: NEGATIVE mg/dL
Hgb urine dipstick: NEGATIVE
Ketones, ur: 15 mg/dL — AB
Leukocytes, UA: NEGATIVE
Nitrite: NEGATIVE
Protein, ur: NEGATIVE mg/dL
Specific Gravity, Urine: 1.026 (ref 1.005–1.030)
Urobilinogen, UA: 0.2 mg/dL (ref 0.0–1.0)
pH: 6 (ref 5.0–8.0)

## 2013-08-26 LAB — CBC WITH DIFFERENTIAL/PLATELET
BASOS PCT: 0 % (ref 0–1)
Basophils Absolute: 0 10*3/uL (ref 0.0–0.1)
EOS ABS: 0.2 10*3/uL (ref 0.0–0.7)
EOS PCT: 1 % (ref 0–5)
HCT: 42.5 % (ref 36.0–46.0)
Hemoglobin: 13.9 g/dL (ref 12.0–15.0)
LYMPHS ABS: 3 10*3/uL (ref 0.7–4.0)
Lymphocytes Relative: 22 % (ref 12–46)
MCH: 28.2 pg (ref 26.0–34.0)
MCHC: 32.7 g/dL (ref 30.0–36.0)
MCV: 86.2 fL (ref 78.0–100.0)
Monocytes Absolute: 0.8 10*3/uL (ref 0.1–1.0)
Monocytes Relative: 6 % (ref 3–12)
NEUTROS PCT: 71 % (ref 43–77)
Neutro Abs: 9.8 10*3/uL — ABNORMAL HIGH (ref 1.7–7.7)
PLATELETS: 267 10*3/uL (ref 150–400)
RBC: 4.93 MIL/uL (ref 3.87–5.11)
RDW: 13.5 % (ref 11.5–15.5)
WBC: 13.8 10*3/uL — ABNORMAL HIGH (ref 4.0–10.5)

## 2013-08-26 LAB — BASIC METABOLIC PANEL WITH GFR
BUN: 15 mg/dL (ref 6–23)
CO2: 25 meq/L (ref 19–32)
Calcium: 9.2 mg/dL (ref 8.4–10.5)
Chloride: 101 meq/L (ref 96–112)
Creatinine, Ser: 0.8 mg/dL (ref 0.50–1.10)
GFR calc Af Amer: >90 mL/min
GFR calc non Af Amer: 78 mL/min — ABNORMAL LOW
Glucose, Bld: 112 mg/dL — ABNORMAL HIGH (ref 70–99)
Potassium: 3.8 meq/L (ref 3.7–5.3)
Sodium: 142 meq/L (ref 137–147)

## 2013-08-26 LAB — PHENOBARBITAL LEVEL: Phenobarbital: 15.7 ug/mL (ref 15.0–40.0)

## 2013-08-26 LAB — CBG MONITORING, ED: Glucose-Capillary: 102 mg/dL — ABNORMAL HIGH (ref 70–99)

## 2013-08-26 NOTE — ED Notes (Signed)
Pt from home.  Reports that she started getting (R) sided lip tingling at 10am today.  Reports that her lip started to droop.  Denies confusion, denies pain.  Reports (L) eye vision change at the beginning of the week.  Pt very tearful, anxious.  Pt is noted to have a (R) lip droop.  Does reports (R) tongue numbness.  Weakness is not any worse than her norm per pt and her husband.  Pt does reports lethargy this afternoon.  .  No slurred speech noted.  Pt is able to raise eyebrows, puff cheeks without difficulty.

## 2013-08-26 NOTE — ED Provider Notes (Signed)
CSN: [id number]     Arrival date & time 08/26/13  4315 History  This chart was scribed for Candyce Churn III, * by Luisa Dago, ED Scribe. This patient was seen in room MH07/MH07 and the patient's care was started at 7:32 PM.    Chief Complaint  Patient presents with  . Altered Mental Status    Patient is a 63 y.o. female presenting with altered mental status. The history is provided by the patient. No language interpreter was used.  Altered Mental Status Associated symptoms: no abdominal pain, no fever, no nausea and no vomiting    HPI Comments: Brandi Ramsey is a 63 y.o. female who presents to the Emergency Department complaining of Altered Mental Status that started 9 hours ago. Pt is complaining of right sided lip drooping that she noticed around 10AM today. She states that it feels like she's been injected with numbing medicine on the right side of her mouth. She is also complaining of associated dizziness and dry mouth, especially on the right side of her tongue. She also states that she's been experiencing visual disturbances that started a few weeks ago. She says that things seem to appear like they are far away even when she's close to them. Pt reports a family history of CVA. She states that her father died from a CVA. Pt states that she's been feeling sleepy through out the day. She denies any recent seizures.   Pt has a history of FXTAS, Seizures, and DM.  Past Medical History  Diagnosis Date  . Seizures   . Diabetes mellitus without complication    Past Surgical History  Procedure Laterality Date  . Abdominal hysterectomy    . Fracture surgery     No family history on file. History  Substance Use Topics  . Smoking status: Never Smoker   . Smokeless tobacco: Not on file  . Alcohol Use: No   OB History   Grav Para Term Preterm Abortions TAB SAB Ect Mult Living                 Review of Systems  Constitutional: Negative for fever.  HENT: Negative for congestion.    Respiratory: Negative for cough and shortness of breath.   Cardiovascular: Negative for chest pain.  Gastrointestinal: Negative for nausea, vomiting, abdominal pain and diarrhea.  All other systems reviewed and are negative.      Allergies  Pentazocine lactate; Demerol; Dilaudid; Morphine and related; and Phenytoin sodium extended  Home Medications   Current Outpatient Rx  Name  Route  Sig  Dispense  Refill  . acetaminophen (TYLENOL) 500 MG tablet   Oral   Take 500 mg by mouth every 6 (six) hours as needed. For pain          . albuterol (PROVENTIL HFA;VENTOLIN HFA) 108 (90 BASE) MCG/ACT inhaler   Inhalation   Inhale 1-2 puffs into the lungs every 6 (six) hours as needed for wheezing.   1 Inhaler   0   . atorvastatin (LIPITOR) 80 MG tablet   Oral   Take 80 mg by mouth daily.           . clonazePAM (KLONOPIN) 1 MG tablet   Oral   Take 1 mg by mouth 3 (three) times daily as needed. For anxiety         . doxycycline (VIBRAMYCIN) 100 MG capsule   Oral   Take 1 capsule (100 mg total) by mouth 2 (two) times daily. One po  bid x 7 days   14 capsule   0   . escitalopram (LEXAPRO) 10 MG tablet   Oral   Take 10 mg by mouth daily.           . metoprolol succinate (TOPROL-XL) 25 MG 24 hr tablet   Oral   Take 25 mg by mouth at bedtime.           . pantoprazole (PROTONIX) 20 MG tablet   Oral   Take 20 mg by mouth daily.           [name] PHENObarbital (LUMINAL) 100 MG tablet   Oral   Take 100 mg by mouth at bedtime.           BP 157/103  Pulse 105  Temp(Src) 98.2 F (36.8 C) (Oral)  Resp 20  Ht 5' 4.5" (1.638 m)  Wt 210 lb (95.255 kg)  BMI 35.50 kg/m2  SpO2 100% Physical Exam  Nursing note and vitals reviewed. Constitutional: She is oriented to person, place, and time. She appears well-developed and well-nourished. No distress.  HENT:  Head: Normocephalic and atraumatic.  Mouth/Throat: Oropharynx is clear and moist.  Eyes: Conjunctivae are normal.  Pupils are equal, round, and reactive to light. No scleral icterus.  Neck: Neck supple.  Cardiovascular: Normal rate, regular rhythm, normal heart sounds and intact distal pulses.   No murmur heard. Pulmonary/Chest: Effort normal and breath sounds normal. No stridor. No respiratory distress. She has no rales.  Abdominal: Soft. Bowel sounds are normal. She exhibits no distension. There is no tenderness.  Musculoskeletal: Normal range of motion.  Neurological: She is alert and oriented to person, place, and time. A cranial nerve deficit is present. No sensory deficit. Coordination (mild tremulousness of the right hand during finger to nose) normal.  Mild drooping of right lip. Normal facial strength. Otherwise, cranial nerves intact.  Mild decrease in strength in right upper and right lower extremity. Appears to be effort dependent. (Initial effort of strength is normal, then strength weakens slightly)  Skin: Skin is warm and dry. No rash noted.  Psychiatric: She has a normal mood and affect. Her behavior is normal.    ED Course  Procedures (including critical care time)  DIAGNOSTIC STUDIES: Oxygen Saturation is 100% on RA, normal by my interpretation.    COORDINATION OF CARE: 7:50 PM- Pt advised of plan for treatment and pt agrees.  Labs Review Labs Reviewed  CBC WITH DIFFERENTIAL - Abnormal; Notable for the following:    WBC 13.8 (*)    Neutro Abs 9.8 (*)    All other components within normal limits  BASIC METABOLIC PANEL - Abnormal; Notable for the following:    Glucose, Bld 112 (*)    GFR calc non Af Amer 78 (*)    All other components within normal limits  URINALYSIS, ROUTINE W REFLEX MICROSCOPIC - Abnormal; Notable for the following:    Ketones, ur 15 (*)    All other components within normal limits  CBG MONITORING, ED - Abnormal; Notable for the following:    Glucose-Capillary 102 (*)    All other components within normal limits  PHENOBARBITAL LEVEL   Imaging Review Ct  Head Wo Contrast  08/26/2013   CLINICAL DATA:  Right-sided facial droop with vision changes  EXAM: CT HEAD WITHOUT CONTRAST  TECHNIQUE: Contiguous axial images were obtained from the base of the skull through the vertex without intravenous contrast.  COMPARISON:  None.  FINDINGS: The bony calvarium is intact. No gross  soft tissue abnormality is noted. Mild atrophic changes are seen. No findings to suggest acute hemorrhage, acute infarction or space-occupying mass lesion are noted.  IMPRESSION: No acute abnormality noted.   Electronically Signed   By: Inez Catalina M.D.   On: 08/26/2013 20:12  All radiology studies independently viewed by me.      EKG Interpretation   Date/Time:  Friday August 26 2013 20:16:59 EST Ventricular Rate:  100 PR Interval:  158 QRS Duration: 68 QT Interval:  346 QTC Calculation: 446 R Axis:   62 Text Interpretation:  Sinus rhythm with occasional Premature ventricular  complexes Nonspecific T wave abnormality Abnormal ECG compared to prior,  nonspecific t wave changes are consistent, PVC is new.   Confirmed by  [facility]  MD, TREY ([id number]) on 08/26/2013 8:43:00 PM      MDM   Final diagnoses:  Facial droop    63 year old female presented with right facial drooping since approximately 10 AM this morning. Exam shows minimal right facial droop with sparing of her forehead. She is outside the TPA window, therefore no code stroke was called and the TPA was given.  CT head negative.  Labwork unremarkable.  Phenobarb level pending.  On recheck, right sided facial droop had improved.  Possibly TIA vs seizure.  Plan to admit to [facility] (Dr. Arnoldo Morale).    I personally performed the services described in this documentation, which was scribed in my presence. The recorded information has been reviewed and is accurate.     Houston Siren III, MD 08/26/13 [phone]

## 2013-08-27 ENCOUNTER — Observation Stay (HOSPITAL_COMMUNITY): Payer: Medicare Other

## 2013-08-27 ENCOUNTER — Encounter (HOSPITAL_COMMUNITY): Payer: Self-pay | Admitting: Internal Medicine

## 2013-08-27 DIAGNOSIS — R29810 Facial weakness: Secondary | ICD-10-CM

## 2013-08-27 DIAGNOSIS — I517 Cardiomegaly: Secondary | ICD-10-CM

## 2013-08-27 DIAGNOSIS — R51 Headache: Secondary | ICD-10-CM | POA: Diagnosis present

## 2013-08-27 DIAGNOSIS — R519 Headache, unspecified: Secondary | ICD-10-CM | POA: Diagnosis present

## 2013-08-27 DIAGNOSIS — R531 Weakness: Secondary | ICD-10-CM | POA: Diagnosis present

## 2013-08-27 DIAGNOSIS — G459 Transient cerebral ischemic attack, unspecified: Secondary | ICD-10-CM

## 2013-08-27 LAB — LIPID PANEL
CHOL/HDL RATIO: 3.9 ratio
Cholesterol: 152 mg/dL (ref 0–200)
HDL: 39 mg/dL — ABNORMAL LOW (ref >39)
LDL CALC: 89 mg/dL (ref 0–99)
Triglycerides: 118 mg/dL (ref <150)
VLDL: 24 mg/dL (ref 0–40)

## 2013-08-27 LAB — HEMOGLOBIN A1C
Hgb A1c MFr Bld: 6.5 % — ABNORMAL HIGH (ref <5.7)
Mean Plasma Glucose: 140 mg/dL — ABNORMAL HIGH (ref <117)

## 2013-08-27 MED ORDER — ALBUTEROL SULFATE (2.5 MG/3ML) 0.083% IN NEBU: 2.5000 mg | INHALATION_SOLUTION | Freq: Four times a day (QID) | RESPIRATORY_TRACT | Status: DC | PRN

## 2013-08-27 MED ORDER — ATORVASTATIN CALCIUM 80 MG PO TABS
80.0000 mg | ORAL_TABLET | Freq: Every day | ORAL | Status: DC
  Administered 2013-08-27 – 2013-08-28 (×2): 80 mg via ORAL
  Filled 2013-08-27 (×2): qty 1

## 2013-08-27 MED ORDER — CLONAZEPAM 1 MG PO TABS
1.0000 mg | ORAL_TABLET | Freq: Three times a day (TID) | ORAL | Status: DC | PRN
  Administered 2013-08-27 (×2): 1 mg via ORAL
  Filled 2013-08-27 (×2): qty 1

## 2013-08-27 MED ORDER — IBUPROFEN 600 MG PO TABS
600.0000 mg | ORAL_TABLET | Freq: Four times a day (QID) | ORAL | Status: DC | PRN
  Administered 2013-08-27: 600 mg via ORAL
  Filled 2013-08-27 (×2): qty 1

## 2013-08-27 MED ORDER — METOPROLOL SUCCINATE ER 25 MG PO TB24
25.0000 mg | ORAL_TABLET | Freq: Every day | ORAL | Status: DC
  Administered 2013-08-27 (×2): 25 mg via ORAL
  Filled 2013-08-27 (×4): qty 1

## 2013-08-27 MED ORDER — ONDANSETRON HCL 4 MG/2ML IJ SOLN
4.0000 mg | Freq: Once | INTRAMUSCULAR | Status: AC
  Administered 2013-08-27: 4 mg via INTRAVENOUS
  Filled 2013-08-27: qty 2

## 2013-08-27 MED ORDER — ESCITALOPRAM OXALATE 10 MG PO TABS
10.0000 mg | ORAL_TABLET | Freq: Every day | ORAL | Status: DC
  Administered 2013-08-27 – 2013-08-28 (×2): 10 mg via ORAL
  Filled 2013-08-27 (×2): qty 1

## 2013-08-27 MED ORDER — PANTOPRAZOLE SODIUM 20 MG PO TBEC
20.0000 mg | DELAYED_RELEASE_TABLET | Freq: Every day | ORAL | Status: DC
  Administered 2013-08-27 – 2013-08-28 (×2): 20 mg via ORAL
  Filled 2013-08-27 (×2): qty 1

## 2013-08-27 MED ORDER — PHENOBARBITAL 32.4 MG PO TABS
100.0000 mg | ORAL_TABLET | Freq: Every day | ORAL | Status: DC
  Administered 2013-08-27 (×2): 97.2 mg via ORAL
  Filled 2013-08-27 (×2): qty 3

## 2013-08-27 MED ORDER — DULOXETINE HCL 30 MG PO CPEP
30.0000 mg | ORAL_CAPSULE | Freq: Every day | ORAL | Status: DC
  Administered 2013-08-27 – 2013-08-28 (×2): 30 mg via ORAL
  Filled 2013-08-27 (×2): qty 1

## 2013-08-27 MED ORDER — KETOROLAC TROMETHAMINE 30 MG/ML IJ SOLN: 30.0000 mg | Freq: Once | INTRAMUSCULAR | Status: DC

## 2013-08-27 MED ORDER — PROCHLORPERAZINE EDISYLATE 5 MG/ML IJ SOLN
10.0000 mg | Freq: Once | INTRAMUSCULAR | Status: AC
  Administered 2013-08-27: 10 mg via INTRAVENOUS
  Filled 2013-08-27: qty 2

## 2013-08-27 MED ORDER — ONDANSETRON HCL 4 MG/2ML IJ SOLN: 4.0000 mg | Freq: Four times a day (QID) | INTRAMUSCULAR | Status: DC | PRN

## 2013-08-27 MED ORDER — ACETAMINOPHEN 500 MG PO TABS: 500.0000 mg | ORAL_TABLET | Freq: Four times a day (QID) | ORAL | Status: DC | PRN

## 2013-08-27 MED ORDER — ENOXAPARIN SODIUM 40 MG/0.4ML ~~LOC~~ SOLN
40.0000 mg | Freq: Every day | SUBCUTANEOUS | Status: DC
  Filled 2013-08-27 (×2): qty 0.4

## 2013-08-27 NOTE — Progress Notes (Signed)
Patient admitted after midnight by Dr. Alcario Drought- await MRI, echo, carotids Patient very anxious- seems to have a large psych component to symptoms  Eulogio Bear DO

## 2013-08-27 NOTE — Progress Notes (Signed)
Pt stated that she took own Klonopin from home. States only took 1mg  as ordered. Pt's husband told to take meds home. Documented in [facility].

## 2013-08-27 NOTE — Progress Notes (Signed)
Echocardiogram 2D Echocardiogram has been performed.  Doyle Askew 08/27/2013, 12:48 PM

## 2013-08-27 NOTE — H&P (Signed)
[facility] History and Physical  Bernestine Holsapple KXF:[id number] DOB: 04-08-1951 DOA: 08/26/2013  Referring physician: EDP PCP: Reeves Dam, MD   Chief Complaint: R facial droop   HPI: Brandi Ramsey is a 63 y.o. female who presents to the ED at [facility] with 9 hour history R sided facial droop that started at [place] sided facial numbness, she does not really complain of subjective RUE or RLE weakness that's different from baseline but it is hard to tell because she has broken her R hand and R leg in the past previously.  Symptoms associated with severe headache.  Patient feeling sleepy throughout the day.  CT head negative for acute abnormality or bleed.  Patient admitted to [place] for CVA workup.  Review of Systems: Systems reviewed.  As above, otherwise negative  Past Medical History  Diagnosis Date  . Seizures   . Diabetes mellitus without complication   . Fragile X syndrome     ataxia syndrome.     Past Surgical History  Procedure Laterality Date  . Abdominal hysterectomy    . Fracture surgery      tib/fib (R) leg   Social History:  reports that she has never smoked. She does not have any smokeless tobacco history on file. She reports that she does not drink alcohol or use illicit drugs.  Allergies  Allergen Reactions  . Pentazocine Lactate Anaphylaxis  . Demerol Hives and Itching  . Dilaudid [Hydromorphone Hcl]     Hallucinations and felt crazy   . Morphine And Related Hives and Itching  . Phenytoin Sodium Extended Hives    Family History  Problem Relation Age of Onset  . Stroke    . Fragile X syndrome Son     Patient is carrier     Prior to Admission medications   Medication Sig Start Date End Date Taking? Authorizing Provider  DULoxetine (CYMBALTA) 30 MG capsule Take 30 mg by mouth daily.   Yes Historical Provider, MD  acetaminophen (TYLENOL) 500 MG tablet Take 500 mg by mouth every 6 (six) hours as needed. For pain     Historical Provider, MD  albuterol  (PROVENTIL HFA;VENTOLIN HFA) 108 (90 BASE) MCG/ACT inhaler Inhale 1-2 puffs into the lungs every 6 (six) hours as needed for wheezing. 08/15/12   Varney Biles, MD  atorvastatin (LIPITOR) 80 MG tablet Take 80 mg by mouth daily.      Historical Provider, MD  clonazePAM (KLONOPIN) 1 MG tablet Take 1 mg by mouth 3 (three) times daily as needed. For anxiety    Historical Provider, MD  escitalopram (LEXAPRO) 10 MG tablet Take 10 mg by mouth daily.      Historical Provider, MD  metoprolol succinate (TOPROL-XL) 25 MG 24 hr tablet Take 25 mg by mouth at bedtime.      Historical Provider, MD  pantoprazole (PROTONIX) 20 MG tablet Take 20 mg by mouth daily.      Historical Provider, MD  PHENObarbital (LUMINAL) 100 MG tablet Take 100 mg by mouth at bedtime.     Historical Provider, MD   Physical Exam: Filed Vitals:   08/26/13 2317  BP: 138/95  Pulse: 102  Temp: 98.1 F (36.7 C)  Resp: 18    BP 138/95  Pulse 102  Temp(Src) 98.1 F (36.7 C) (Oral)  Resp 18  Ht 5' 4.5" (1.638 m)  Wt 98.748 kg (217 lb 11.2 oz)  BMI 36.80 kg/m2  SpO2 97%  General Appearance:    Alert, oriented, no distress,  appears stated age  Head:    Normocephalic, atraumatic  Eyes:    PERRL, EOMI, sclera non-icteric        Nose:   Nares without drainage or epistaxis. Mucosa, turbinates normal  Throat:   Moist mucous membranes. Oropharynx without erythema or exudate.  Neck:   Supple. No carotid bruits.  No thyromegaly.  No lymphadenopathy.   Back:     No CVA tenderness, no spinal tenderness  Lungs:     Clear to auscultation bilaterally, without wheezes, rhonchi or rales  Chest wall:    No tenderness to palpitation  Heart:    Regular rate and rhythm without murmurs, gallops, rubs  Abdomen:     Soft, non-tender, nondistended, normal bowel sounds, no organomegaly  Genitalia:    deferred  Rectal:    deferred  Extremities:   No clubbing, cyanosis or edema.  Pulses:   2+ and symmetric all extremities  Skin:   Skin color,  texture, turgor normal, no rashes or lesions  Lymph nodes:   Cervical, supraclavicular, and axillary nodes normal  Neurologic:   Tongue with ? Deviation to the R side, very mild R facial droop, does have RUE and RLE weakness but unsure how much of this is new vs old given patients story.    Labs on Admission:  Basic Metabolic Panel:  Recent Labs Lab 08/26/13 1953  NA 142  K 3.8  CL 101  CO2 25  GLUCOSE 112*  BUN 15  CREATININE 0.80  CALCIUM 9.2   Liver Function Tests: No results found for this basename: AST, ALT, ALKPHOS, BILITOT, PROT, ALBUMIN,  in the last 168 hours No results found for this basename: LIPASE, AMYLASE,  in the last 168 hours No results found for this basename: AMMONIA,  in the last 168 hours CBC:  Recent Labs Lab 08/26/13 1953  WBC 13.8*  NEUTROABS 9.8*  HGB 13.9  HCT 42.5  MCV 86.2  PLT 267   Cardiac Enzymes: No results found for this basename: CKTOTAL, CKMB, CKMBINDEX, TROPONINI,  in the last 168 hours  BNP (last 3 results) No results found for this basename: PROBNP,  in the last 8760 hours CBG:  Recent Labs Lab 08/26/13 1941  GLUCAP 102*    Radiological Exams on Admission: Ct Head Wo Contrast  08/26/2013   CLINICAL DATA:  Right-sided facial droop with vision changes  EXAM: CT HEAD WITHOUT CONTRAST  TECHNIQUE: Contiguous axial images were obtained from the base of the skull through the vertex without intravenous contrast.  COMPARISON:  None.  FINDINGS: The bony calvarium is intact. No gross soft tissue abnormality is noted. Mild atrophic changes are seen. No findings to suggest acute hemorrhage, acute infarction or space-occupying mass lesion are noted.  IMPRESSION: No acute abnormality noted.   Electronically Signed   By: Inez Catalina M.D.   On: 08/26/2013 20:12    EKG: Independently reviewed.  Assessment/Plan Principal Problem:   TIA (transient ischemic attack) Active Problems:   Headache(784.0)   1. TIA vs Stroke vs other -  neurology consulted and seeing patient, stroke work up ordered, suspect MRI will be most useful in differentiating.  Patient also has history of FXTAS for which she is followed at [facility]. 2. Headache - patient requests motrin for this, will order.  Dr. Leonel Ramsay consulted and going to see patient  Code Status: Full  Family Communication: No family in room Disposition Plan: Admit to inpatient   Time spent: 50 min  GARDNER, JARED M. [facility] Pager  [phone]  If 7AM-7PM, please contact the day team taking care of the patient [email or web address] Password [email or web address] 08/27/2013, 12:21 AM

## 2013-08-27 NOTE — Consult Note (Signed)
Neurology Consultation Reason for Consult: right facial numbness Referring Physician: Myrtice Lauth  CC: Right facial numbness  History is obtained from: Patient, husband  HPI: Brandi Ramsey is a 63 y.o. female with a history of fragile X. ataxia syndrome as well as seizure disorder that has been controlled well for the past 40 years. She also has been told by a neurologist in the past that she might have "silent migraines" but is not sure what symptoms made him think that.   She has had headaches on and off for the past week. Today, it has been particularly severe located in the left frontal region, throbbing in character, associated with photophobia and nausea.  She states that the numbness and "pulling" has been intermittent throughout the day in her right face. She still has some difference in sensation.   LKW: 10 AM tpa given?: no, outside of window    ROS: A 14 point ROS was performed and is negative except as noted in the HPI.  Past Medical History  Diagnosis Date  . Seizures   . Diabetes mellitus without complication   . Fragile X syndrome     ataxia syndrome.      Family History: Fragile X. in her son  Social History: Tob: Denies  Exam: Current vital signs: BP 129/83  Pulse 87  Temp(Src) 98 F (36.7 C) (Oral)  Resp 18  Ht 5' 4.5" (1.638 m)  Wt 98.748 kg (217 lb 11.2 oz)  BMI 36.80 kg/m2  SpO2 95% Vital signs in last 24 hours: Temp:  [98 F (36.7 C)-98.2 F (36.8 C)] 98 F (36.7 C) (03/07 0105) Pulse Rate:  [87-105] 87 (03/07 0105) Resp:  [12-20] 18 (03/07 0105) BP: (118-157)/(80-116) 129/83 mmHg (03/07 0105) SpO2:  [95 %-100 %] 95 % (03/07 0105) Weight:  [95.255 kg (210 lb)-98.748 kg (217 lb 11.2 oz)] 98.748 kg (217 lb 11.2 oz) (03/06 2317)  General: In bed, NAD CV: Regular rate and rhythm Mental Status: Patient is awake, alert, oriented to person, place, month, year, and situation. Immediate and remote memory are intact. Patient is able to give a  clear and coherent history. No signs of neglect She does appear to have mild latency of response at times. Cranial Nerves: II: Visual Fields are full. Pupils are equal, round, and reactive to light.  Discs are difficult to visualize. III,IV, VI: EOMI without ptosis or diploplia.  V: Facial sensation is diminished to temperature on the right VII: Facial movement is symmetric.  VIII: hearing is intact to voice X: Uvula elevates symmetrically XI: Shoulder shrug is symmetric. XII: tongue is midline without atrophy or fasciculations.  Motor: Tone is normal. Bulk is normal. 5/5 strength was present in the left, she has possible very mild weakness on the right. Sensory: Sensation is symmetric to light touch and temperature in the arms, she has an area of proximal numbness on her right leg which is not new. Otherwise intact on right leg Deep Tendon Reflexes: 2+ and symmetric in the biceps and patellae.  Cerebellar: FNF intact on left, on the right she does have some horizontal tremor on finger-nose-finger. She has also has a mild tremor with postural component. Gait: Not tested due to patient safety concerns   I have reviewed labs in epic and the results pertinent to this consultation are: Phenobarbital level 15.7, within therapeutic range  I have reviewed the images obtained: CT head-negative  Impression: 63 year old female with throbbing photophobic headache and intermittent right facial numbness. Possibilities include small stroke  or complicated migraine. The possibility of small focal seizures is raised, but I think is relatively unlikely given her seizure control for the past 40 years. She does have further episodes in the future, then this may need to be considered.  Recommendations: 1) MRI brain 2) continue phenobarbital at home dose 3) Will give Compazine 10 mg plus Toradol 30 mg for migraine treatment 4) Will follow with you.   Roland Rack, MD [facility] [phone]  If 7pm- 7am, please page neurology on call at [phone].

## 2013-08-28 DIAGNOSIS — R51 Headache: Secondary | ICD-10-CM

## 2013-08-28 NOTE — Progress Notes (Signed)
VASCULAR LAB PRELIMINARY  PRELIMINARY  PRELIMINARY  PRELIMINARY  Carotid Dopplers completed.    Preliminary report:  1-39% ICA stenosis.  Vertebral artery flow is antegrade.  Edmund Rick, RVT 08/28/2013, 10:12 AM

## 2013-08-28 NOTE — Evaluation (Signed)
Physical Therapy Evaluation Patient Details Name: Brandi Ramsey MRN: [id number] DOB: 06-14-1951 Today's Date: 08/28/2013 Time: 1015-1027 PT Time Calculation (min): 12 min  PT Assessment / Plan / Recommendation History of Present Illness  Brandi Ramsey is a 63 y.o. female who presents to the ED at [facility] with 9 hour history R sided facial droop that started at 10AM.  R sided facial numbness, she does not really complain of subjective RUE or RLE weakness that's different from baseline but it is hard to tell because she has broken her R hand and R leg in the past previously.  Symptoms associated with severe headache.Brandi Ramsey   Patient with a history of fragile X. ataxia syndrome as well as seizure disorder that has been controlled well for the past 40 years. She also has been told by a neurologist in the past that she might have "silent migraines".    Clinical Impression  Patient is independent in all mobility and gait.  Good balance with score of 21/21 on modified DGI balance assessment.  No acute PT needs identified - PT will sign off.  Encouraged ambulation in hallway with family/nursing.    PT Assessment  Patent does not need any further PT services    Follow Up Recommendations  No PT follow up;Supervision - Intermittent    Does the patient have the potential to tolerate intense rehabilitation      Barriers to Discharge        Equipment Recommendations  None recommended by PT    Recommendations for Other Services     Frequency      Precautions / Restrictions Precautions Precautions: None Restrictions Weight Bearing Restrictions: No   Pertinent Vitals/Pain       Mobility  Bed Mobility Overal bed mobility: Independent Transfers Overall transfer level: Independent Equipment used: None General transfer comment: Good balance in standing Ambulation/Gait Ambulation/Gait assistance: Independent Ambulation Distance (Feet): 200 Feet Assistive device: None Gait Pattern/deviations:  WFL(Within Functional Limits) Gait velocity: WFL Gait velocity interpretation: at or above normal speed for age/gender General Gait Details: Good gait pattern, balance, and speed Modified Rankin (Stroke Patients Only) Pre-Morbid Rankin Score: No symptoms Modified Rankin: No symptoms        PT Goals(Current goals can be found in the care plan section)    Visit Information  Last PT Received On: 08/28/13 Assistance Needed: +1 History of Present Illness: Brandi Ramsey is a 63 y.o. female who presents to the ED at [facility] with 9 hour history R sided facial droop that started at 10AM.  R sided facial numbness, she does not really complain of subjective RUE or RLE weakness that's different from baseline but it is hard to tell because she has broken her R hand and R leg in the past previously.  Symptoms associated with severe headache.Brandi Ramsey   Patient with a history of fragile X. ataxia syndrome as well as seizure disorder that has been controlled well for the past 40 years. She also has been told by a neurologist in the past that she might have "silent migraines".         Prior [place] expects to be discharged to:: Private residence Living Arrangements: Spouse/significant other Available Help at Discharge: Family;Available PRN/intermittently (Sister, brother, and daughter live nearby) Type of Home: House Home Access: Stairs to enter Technical brewer of Steps: 1 Entrance Stairs-Rails: None Home Layout: Able to live on main level with bedroom/bathroom Home Equipment: Walker - 2 wheels;Cane - single point;Wheelchair - manual Prior Function Level  of Independence: Independent Communication Communication: No difficulties    Cognition  Cognition Arousal/Alertness: Awake/alert Behavior During Therapy: WFL for tasks assessed/performed Overall Cognitive Status: Within Functional Limits for tasks assessed    Extremity/Trunk Assessment Upper Extremity  Assessment Upper Extremity Assessment: Overall WFL for tasks assessed Lower Extremity Assessment Lower Extremity Assessment: Overall WFL for tasks assessed (Strength symmetrical) Cervical / Trunk Assessment Cervical / Trunk Assessment: Normal   Balance Balance Overall balance assessment: Independent Standardized Balance Assessment Standardized Balance Assessment : Dynamic Gait Index Dynamic Gait Index Level Surface: Normal Change in Gait Speed: Normal Gait with Horizontal Head Turns: Normal Gait with Vertical Head Turns: Normal Gait and Pivot Turn: Normal Step Over Obstacle: Normal Step Around Obstacles: Normal Steps:  (NT)  End of Session PT - End of Session Activity Tolerance: Patient tolerated treatment well Patient left: in bed;with call bell/phone within reach;with family/visitor present (sitting EOB) Nurse Communication: Mobility status (Encouraged ambulation in hallway with family/nursin)  GP Functional Assessment Tool Used: Clinical judgement Functional Limitation: Mobility: Walking and moving around Mobility: Walking and Moving Around Current Status (K0938): 0 percent impaired, limited or restricted Mobility: Walking and Moving Around Goal Status (H8299): 0 percent impaired, limited or restricted   Brandi Ramsey 08/28/2013, 10:49 AM Brandi Ramsey. Brandi Ramsey, [place] Pager [phone]

## 2013-08-28 NOTE — Progress Notes (Signed)
D/C orders received. PT and husband educated on d/c instructions and stroke prevention. Verbalized understanding. Pt taken downstairs by staff.

## 2013-08-28 NOTE — Discharge Summary (Signed)
Physician Discharge Summary  Brandi Ramsey [id number] DOB: 10-27-1950 DOA: 08/26/2013  PCP: Reeves Dam, MD  Admit date: 08/26/2013 Discharge date: 08/28/2013  Time spent: 35 minutes  Recommendations for Outpatient Follow-up:  1.   Discharge Diagnoses:  Principal Problem:   TIA (transient ischemic attack) Active Problems:   Headache(784.0)   Weakness   Discharge Condition: improved  Diet recommendation: diabetic  Filed Weights   08/26/13 1934 08/26/13 2317  Weight: 95.255 kg (210 lb) 98.748 kg (217 lb 11.2 oz)    Hospital course:   63 year old female with throbbing photophobic headache and intermittent right facial numbness. Possibilities include small stroke or complicated migraine. The possibility of small focal seizures is raised, but I think is relatively unlikely given her seizure control for the past 40 years. She does have further episodes in the future, then this may need to be considered.  Recommendations:  MRI brain negative continue phenobarbital at home dose  Will give Compazine 10 mg plus Toradol 30 mg for migraine treatment - relived migraine- suspect triggered by stress       Procedures: Carotids: 1-39% ICA stenosis. Vertebral artery flow is antegrade Echo: Study Conclusions  - Left ventricle: The cavity size was normal. Wall thickness was increased in a pattern of moderate LVH. There was focal basal hypertrophy. Systolic function was normal. The estimated ejection fraction was in the range of 60% to 65%. Wall motion was normal; there were no regional wall motion abnormalities. There was an increased relative contribution of atrial contraction to ventricular filling. - Aortic valve: Valve area: 2.3cm^2 (Vmax).    Consultations:  neuro  Discharge Exam: Filed Vitals:   08/28/13 0828  BP: 117/80  Pulse: 90  Temp: 98.1 F (36.7 C)  Resp: 18    General: up walking units- anxious to go home Cardiovascular: rrr Respiratory: clear  anterior  Discharge Instructions      Discharge Orders   Future Orders Complete By Expires   Diet - low sodium heart healthy  As directed    Increase activity slowly  As directed        Medication List         albuterol 108 (90 BASE) MCG/ACT inhaler  Commonly known as:  PROVENTIL HFA;VENTOLIN HFA  Inhale 1-2 puffs into the lungs every 6 (six) hours as needed for wheezing or shortness of breath.     atorvastatin 80 MG tablet  Commonly known as:  LIPITOR  Take 80 mg by mouth at bedtime.     Calcium-Vitamin D 600-200 MG-UNIT per tablet  Take 1 tablet by mouth 2 (two) times daily.     clonazePAM 1 MG tablet  Commonly known as:  KLONOPIN  Take 1 mg by mouth 3 (three) times daily as needed. For anxiety     DULoxetine 30 MG capsule  Commonly known as:  CYMBALTA  Take 30 mg by mouth daily.     escitalopram 20 MG tablet  Commonly known as:  LEXAPRO  Take 20 mg by mouth at bedtime.     Fluticasone-Salmeterol 250-50 MCG/DOSE Aepb  Commonly known as:  ADVAIR  Inhale 1 puff into the lungs 2 (two) times daily.     loratadine 10 MG tablet  Commonly known as:  CLARITIN  Take 10 mg by mouth every morning.     meclizine 25 MG tablet  Commonly known as:  ANTIVERT  Take 25 mg by mouth every 6 (six) hours as needed for dizziness.     metoprolol succinate 25 MG  24 hr tablet  Commonly known as:  TOPROL-XL  Take 25 mg by mouth at bedtime.     omeprazole 40 MG capsule  Commonly known as:  PRILOSEC  Take 40 mg by mouth every morning.     PHENobarbital 97.2 MG tablet  Commonly known as:  LUMINAL  Take 97.2 mg by mouth at bedtime.     ranitidine 150 MG tablet  Commonly known as:  ZANTAC  Take 150-300 mg by mouth 2 (two) times daily as needed (for indigestion).     VISINE 0.05 % ophthalmic solution  Generic drug:  tetrahydrozoline  Place 2 drops into both eyes 5 (five) times daily as needed (for dry eyes).     Vitamin D (Ergocalciferol) 50000 UNITS Caps capsule  Commonly  known as:  DRISDOL  Take 50,000 Units by mouth every Monday.       Allergies  Allergen Reactions  . Pentazocine Lactate Anaphylaxis  . Demerol Hives and Itching  . Dilaudid [Hydromorphone Hcl]     Hallucinations and felt crazy   . Morphine And Related Hives and Itching  . Phenytoin Sodium Extended Hives   Follow-up Information   Follow up with HICKS, KRISTIN D, MD In 1 week.   Specialty:  Internal Medicine       The results of significant diagnostics from this hospitalization (including imaging, microbiology, ancillary and laboratory) are listed below for reference.    Significant Diagnostic Studies: Dg Chest 2 View  08/27/2013   CLINICAL DATA:  Possible stroke.  Headache.  EXAM: CHEST  2 VIEW  COMPARISON:  08/14/2012  FINDINGS: Cardiac silhouette is normal in size. The aorta is tortuous. No mediastinal or hilar masses. Clear lungs. No pleural effusion or pneumothorax. The bony thorax is intact.  IMPRESSION: No active cardiopulmonary disease.   Electronically Signed   By: Lajean Manes M.D.   On: 08/27/2013 17:16   Ct Head Wo Contrast  08/26/2013   CLINICAL DATA:  Right-sided facial droop with vision changes  EXAM: CT HEAD WITHOUT CONTRAST  TECHNIQUE: Contiguous axial images were obtained from the base of the skull through the vertex without intravenous contrast.  COMPARISON:  None.  FINDINGS: The bony calvarium is intact. No gross soft tissue abnormality is noted. Mild atrophic changes are seen. No findings to suggest acute hemorrhage, acute infarction or space-occupying mass lesion are noted.  IMPRESSION: No acute abnormality noted.   Electronically Signed   By: Inez Catalina M.D.   On: 08/26/2013 20:12   Mr Brain Wo Contrast  08/27/2013   CLINICAL DATA:  Right facial droop.  Evaluate for stroke.  EXAM: MRI HEAD WITHOUT CONTRAST  MRA HEAD WITHOUT CONTRAST  TECHNIQUE: Multiplanar, multiecho pulse sequences of the brain and surrounding structures were obtained without intravenous contrast.  Angiographic images of the head were obtained using MRA technique without contrast.  COMPARISON:  Head CT 08/26/2013 and MRI 10/02/2010  FINDINGS: MRI HEAD FINDINGS  There is no acute infarct. There is no evidence of intracranial hemorrhage, mass, midline shift, or extra-axial fluid collection. Patchy T2 hyperintensities in the subcortical and deep cerebral white matter do not appear significantly changed and are compatible with mild to moderate chronic small vessel ischemic disease. Mild cerebral atrophy is unchanged.  Orbits are unremarkable. Paranasal sinuses and mastoid air cells are clear. Major intracranial vascular flow voids are preserved.  MRA HEAD FINDINGS  The visualized distal vertebral arteries are patent with the left being dominant. PICA origins are patent bilaterally. Right AICA origin is patent.  The SCA origins are patent bilaterally. Basilar artery is patent without stenosis. PCAs are unremarkable. There is a patent right posterior communicating artery.  Internal carotid arteries are patent from skullbase to carotid termini. There is question of mild narrowing of the anterior left cavernous ICA. ACAs and MCAs are unremarkable. No intracranial aneurysm. A small right posterior communicating artery infundibulum is incidentally noted.  IMPRESSION: 1. No evidence of acute intracranial abnormality. 2. Unchanged, mild to moderate chronic small vessel ischemic disease. 3. No evidence of major intracranial arterial occlusion or high-grade stenosis. Question mild narrowing of left cavernous ICA.   Electronically Signed   By: Logan Bores   On: 08/27/2013 18:56   Mr Jodene Nam Head/brain Wo Cm  08/27/2013   CLINICAL DATA:  Right facial droop.  Evaluate for stroke.  EXAM: MRI HEAD WITHOUT CONTRAST  MRA HEAD WITHOUT CONTRAST  TECHNIQUE: Multiplanar, multiecho pulse sequences of the brain and surrounding structures were obtained without intravenous contrast. Angiographic images of the head were obtained using MRA  technique without contrast.  COMPARISON:  Head CT 08/26/2013 and MRI 10/02/2010  FINDINGS: MRI HEAD FINDINGS  There is no acute infarct. There is no evidence of intracranial hemorrhage, mass, midline shift, or extra-axial fluid collection. Patchy T2 hyperintensities in the subcortical and deep cerebral white matter do not appear significantly changed and are compatible with mild to moderate chronic small vessel ischemic disease. Mild cerebral atrophy is unchanged.  Orbits are unremarkable. Paranasal sinuses and mastoid air cells are clear. Major intracranial vascular flow voids are preserved.  MRA HEAD FINDINGS  The visualized distal vertebral arteries are patent with the left being dominant. PICA origins are patent bilaterally. Right AICA origin is patent. The SCA origins are patent bilaterally. Basilar artery is patent without stenosis. PCAs are unremarkable. There is a patent right posterior communicating artery.  Internal carotid arteries are patent from skullbase to carotid termini. There is question of mild narrowing of the anterior left cavernous ICA. ACAs and MCAs are unremarkable. No intracranial aneurysm. A small right posterior communicating artery infundibulum is incidentally noted.  IMPRESSION: 1. No evidence of acute intracranial abnormality. 2. Unchanged, mild to moderate chronic small vessel ischemic disease. 3. No evidence of major intracranial arterial occlusion or high-grade stenosis. Question mild narrowing of left cavernous ICA.   Electronically Signed   By: Logan Bores   On: 08/27/2013 18:56    Microbiology: No results found for this or any previous visit (from the past 240 hour(s)).   Labs: Basic Metabolic Panel:  Recent Labs Lab 08/26/13 1953  NA 142  K 3.8  CL 101  CO2 25  GLUCOSE 112*  BUN 15  CREATININE 0.80  CALCIUM 9.2   Liver Function Tests: No results found for this basename: AST, ALT, ALKPHOS, BILITOT, PROT, ALBUMIN,  in the last 168 hours No results found for  this basename: LIPASE, AMYLASE,  in the last 168 hours No results found for this basename: AMMONIA,  in the last 168 hours CBC:  Recent Labs Lab 08/26/13 1953  WBC 13.8*  NEUTROABS 9.8*  HGB 13.9  HCT 42.5  MCV 86.2  PLT 267   Cardiac Enzymes: No results found for this basename: CKTOTAL, CKMB, CKMBINDEX, TROPONINI,  in the last 168 hours BNP: BNP (last 3 results) No results found for this basename: PROBNP,  in the last 8760 hours CBG:  Recent Labs Lab 08/26/13 1941  GLUCAP 102*       Signed:  Zeda Gangwer  [facility] 08/28/2013, 1:27  PM

## 2013-09-02 DIAGNOSIS — I6782 Cerebral ischemia: Secondary | ICD-10-CM | POA: Insufficient documentation

## 2013-09-02 DIAGNOSIS — G939 Disorder of brain, unspecified: Secondary | ICD-10-CM | POA: Insufficient documentation

## 2013-09-13 DIAGNOSIS — R809 Proteinuria, unspecified: Secondary | ICD-10-CM | POA: Insufficient documentation

## 2013-09-13 DIAGNOSIS — E785 Hyperlipidemia, unspecified: Secondary | ICD-10-CM | POA: Insufficient documentation

## 2013-09-13 DIAGNOSIS — R7303 Prediabetes: Secondary | ICD-10-CM | POA: Insufficient documentation

## 2013-09-13 DIAGNOSIS — K219 Gastro-esophageal reflux disease without esophagitis: Secondary | ICD-10-CM | POA: Insufficient documentation

## 2013-09-13 DIAGNOSIS — I10 Essential (primary) hypertension: Secondary | ICD-10-CM | POA: Insufficient documentation

## 2013-10-15 DIAGNOSIS — H526 Other disorders of refraction: Secondary | ICD-10-CM | POA: Insufficient documentation

## 2013-10-15 DIAGNOSIS — H269 Unspecified cataract: Secondary | ICD-10-CM | POA: Insufficient documentation

## 2014-02-01 DIAGNOSIS — R809 Proteinuria, unspecified: Secondary | ICD-10-CM | POA: Insufficient documentation

## 2014-02-01 DIAGNOSIS — R61 Generalized hyperhidrosis: Secondary | ICD-10-CM | POA: Insufficient documentation

## 2014-02-01 DIAGNOSIS — W19XXXA Unspecified fall, initial encounter: Secondary | ICD-10-CM | POA: Insufficient documentation

## 2014-02-01 DIAGNOSIS — H9209 Otalgia, unspecified ear: Secondary | ICD-10-CM | POA: Insufficient documentation

## 2014-02-01 DIAGNOSIS — L659 Nonscarring hair loss, unspecified: Secondary | ICD-10-CM | POA: Insufficient documentation

## 2014-02-01 DIAGNOSIS — J45909 Unspecified asthma, uncomplicated: Secondary | ICD-10-CM | POA: Insufficient documentation

## 2014-02-01 DIAGNOSIS — IMO0001 Reserved for inherently not codable concepts without codable children: Secondary | ICD-10-CM | POA: Insufficient documentation

## 2014-02-01 DIAGNOSIS — R5383 Other fatigue: Secondary | ICD-10-CM | POA: Insufficient documentation

## 2014-04-12 DIAGNOSIS — K120 Recurrent oral aphthae: Secondary | ICD-10-CM | POA: Insufficient documentation

## 2014-04-14 DIAGNOSIS — S52123A Displaced fracture of head of unspecified radius, initial encounter for closed fracture: Secondary | ICD-10-CM | POA: Insufficient documentation

## 2014-04-14 DIAGNOSIS — S53106A Unspecified dislocation of unspecified ulnohumeral joint, initial encounter: Secondary | ICD-10-CM | POA: Insufficient documentation

## 2014-06-26 ENCOUNTER — Telehealth: Payer: Self-pay | Admitting: Family

## 2014-06-26 NOTE — Telephone Encounter (Signed)
I received this message today. This message was left in my voicemail on 06/21/14 when I was out of the office - Dr Gaynell Face suggested a doctor for her and that office said that she needed a referral. She fell and dislocated her elbow 4 months ago. She is still in a cast and supposed to come out of cast next month. She needs a referral to the doctor that you recommended. 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 She spoke to someone twice that thought that she herself has Fragile X. So she is asking for your help. She will pay for your service to do the referral. Lenox can be reached at [phone]. TG

## 2014-06-30 NOTE — Telephone Encounter (Signed)
I have called Dr. Tawanna Cooler [phone] and will recommend referral to Dr. Metta Clines [address] [place]. [place], [place] [address] [phone].

## 2014-08-10 ENCOUNTER — Encounter: Payer: Self-pay | Admitting: *Deleted

## 2014-08-17 ENCOUNTER — Ambulatory Visit: Payer: Medicare Other | Admitting: Neurology

## 2014-10-03 ENCOUNTER — Ambulatory Visit: Payer: Medicare Other | Admitting: Neurology

## 2014-10-10 ENCOUNTER — Encounter: Payer: Self-pay | Admitting: Neurology

## 2014-10-10 ENCOUNTER — Ambulatory Visit (INDEPENDENT_AMBULATORY_CARE_PROVIDER_SITE_OTHER): Payer: PPO | Admitting: Neurology

## 2014-10-10 VITALS — BP 140/100 | HR 90 | Resp 20 | Ht 64.5 in | Wt 232.3 lb

## 2014-10-10 DIAGNOSIS — R1310 Dysphagia, unspecified: Secondary | ICD-10-CM | POA: Diagnosis not present

## 2014-10-10 DIAGNOSIS — R251 Tremor, unspecified: Secondary | ICD-10-CM

## 2014-10-10 DIAGNOSIS — G40909 Epilepsy, unspecified, not intractable, without status epilepticus: Secondary | ICD-10-CM

## 2014-10-10 DIAGNOSIS — Z5181 Encounter for therapeutic drug level monitoring: Secondary | ICD-10-CM

## 2014-10-10 DIAGNOSIS — G119 Hereditary ataxia, unspecified: Secondary | ICD-10-CM

## 2014-10-10 DIAGNOSIS — Q992 Fragile X chromosome: Secondary | ICD-10-CM | POA: Insufficient documentation

## 2014-10-10 DIAGNOSIS — G40309 Generalized idiopathic epilepsy and epileptic syndromes, not intractable, without status epilepticus: Secondary | ICD-10-CM | POA: Insufficient documentation

## 2014-10-10 DIAGNOSIS — G43009 Migraine without aura, not intractable, without status migrainosus: Secondary | ICD-10-CM

## 2014-10-10 DIAGNOSIS — G250 Essential tremor: Secondary | ICD-10-CM

## 2014-10-10 DIAGNOSIS — Z79899 Other long term (current) drug therapy: Secondary | ICD-10-CM

## 2014-10-10 NOTE — Patient Instructions (Addendum)
1.  We will check a DEXA scan  2.  Recommend taking calcium 500mg -vitamin D 600 unit pill twice daily 3.  Swallow evaluation 4.  Stop Lexapro.  Start citalopram 20mg  daily. 5.  Follow up after DEXA scan to discuss possibility of changing phenobarbital 6.  Will look into if anybody performs Botox for voice tremor

## 2014-10-10 NOTE — Progress Notes (Signed)
NEUROLOGY CONSULTATION NOTE  KATHRYNNE Ramsey MRN: [id number] DOB: October 04, 1950  Referring provider: Dr. Ishmael Holter Primary care provider: Dr. Ishmael Holter  Reason for consult:  FTAX  HISTORY OF PRESENT ILLNESS: Brandi Ramsey is a 64 year old woman with hypercholesterolemia, type II diabetes mellitus, remote seizure disorder, migraine, depression, and history of TIA who presents for fragile X tremor-associated syndrome.  She is accompanied by Brandi husband who provides some history.  Records, labs and MRI and MRA of head from last year reviewed.  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  About 20 years ago, she began experiencing dizzy spells.  She apparently had imaging of the head which was suspicious for possible multiple sclerosis.  She had lumbar puncture, which was negative.  About 10 years ago, she developed mild tremor and unsteadiness.  She was worked up by Dr. 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  Work up included genetic testing for FMR1 gene premutation.   She has had a progressive course over the years.  She will have dizzy spells, described as lightheadedness, which will cause falls.  She has fractured Brandi wrists and leg.  She also has tremor of the hands, causing difficulty with writing.  She also developed dysphonia and was found to have tremor involving Brandi "voice box".  She reports prior episodes of both bowel and bladder incontinence, which are rare.  She also reports poor depth perception.  For example, she may sometimes drive too close to the curb or she may accidentally bump into somebody when walking through the grocery store aisle.  She reports increased fatigue and weight gain.  She has depression and mood swings.  She also reports short-term memory problems.  She also has problems with swallowing and has choked on some occasions, requiring the Heimlich maneuver.  She has a remote history of epilepsy.   She had convulsions as a baby and two generalized tonic-clonic seizures in Brandi early twenties.  She has been on phenobarbital ever since.  No recurrent seizures.  She also has history of migraine, described as severe pressure and throbbing, over Brandi left eye.  In March 2015, she had such a headache associated with right sided facial numbness and lower facial weakness.  She was admitted to [facility] for TIA.  CT of the head was unremarkable.  MRI of the brain showed periventricular and subcortical hyperintensities.  MRA of the head showed no major intracranial arterial stenosis, however there is question of mild narrowing of the left cavernous ICA.  Carotid duplex showed 1-39% bilateral ICA stenosis.  2D echo showed LVEF of 60-65% with moderate LVH.  Hgb A1c was 6.5 and LDL was 85.  PAST MEDICAL HISTORY: Past Medical History  Diagnosis Date  . Seizures   . Diabetes mellitus without complication   . Fragile X syndrome     ataxia syndrome.    [name] Hypertension   . Hemorrhoids   . Transient cerebral ischemia   . Vertigo, benign paroxysmal   . MS (multiple sclerosis)   . Cerebellar ataxia     PAST SURGICAL HISTORY: Past Surgical History  Procedure Laterality Date  . Abdominal hysterectomy    . Fracture surgery      tib/fib (R) leg    MEDICATIONS: Current Outpatient Prescriptions on File Prior to Visit  Medication Sig Dispense Refill  . atorvastatin (LIPITOR) 80 MG tablet Take 80 mg by mouth at bedtime.     [name]  atorvastatin (LIPITOR) 80 MG tablet TAKE 1 TABLET AT BEDTIME.    [name] BREO ELLIPTA 100-25 MCG/INH AEPB   11  . Calcium-Vitamin D 600-200 MG-UNIT per tablet Take 1 tablet by mouth 2 (two) times daily.    . cetirizine (ZYRTEC) 10 MG tablet Take 10 mg by mouth daily.  5  . clonazePAM (KLONOPIN) 1 MG tablet Take 1 mg by mouth 3 (three) times daily as needed. For anxiety    . [employer] CALCIUM 600+D 600-800 MG-UNIT TABS Take 1 tablet by mouth 2 (two) times daily with a meal.  3  . DULoxetine  (CYMBALTA) 30 MG capsule Take 30 mg by mouth daily.    [name] loratadine (CLARITIN) 10 MG tablet Take 10 mg by mouth every morning.    . metoprolol succinate (TOPROL-XL) 25 MG 24 hr tablet Take 25 mg by mouth at bedtime.    . pantoprazole (PROTONIX) 40 MG tablet   3  . PHENobarbital (LUMINAL) 97.2 MG tablet Take 97.2 mg by mouth at bedtime.    . ranitidine (ZANTAC) 150 MG tablet Take 150-300 mg by mouth 2 (two) times daily as needed (for indigestion).    [name] tetrahydrozoline (VISINE) 0.05 % ophthalmic solution Place 2 drops into both eyes 5 (five) times daily as needed (for dry eyes).    [name] albuterol (PROVENTIL HFA;VENTOLIN HFA) 108 (90 BASE) MCG/ACT inhaler Inhale 1-2 puffs into the lungs every 6 (six) hours as needed for wheezing or shortness of breath.    . DULoxetine (CYMBALTA) 60 MG capsule   1  . Fluticasone-Salmeterol (ADVAIR) 250-50 MCG/DOSE AEPB Inhale 1 puff into the lungs 2 (two) times daily.    . meclizine (ANTIVERT) 25 MG tablet Take 25 mg by mouth every 6 (six) hours as needed for dizziness.    [name] omeprazole (PRILOSEC) 40 MG capsule Take 40 mg by mouth every morning.    . Vitamin D, Ergocalciferol, (DRISDOL) 50000 UNITS CAPS capsule Take 50,000 Units by mouth every Monday.     No current facility-administered medications on file prior to visit.    ALLERGIES: Allergies  Allergen Reactions  . Buprenorphine Hcl Hives and Itching  . Meperidine Hives and Itching    Other reaction(s): HIVES,ITCHING  . Morphine And Related Hives and Itching  . Pentazocine Anaphylaxis  . Pentazocine Lactate Anaphylaxis  . Phenytoin Hives    Other reaction(s): RASH  . Demerol Hives and Itching  . Dilaudid [Hydromorphone Hcl]     Hallucinations and felt crazy   . Doxycycline Hyclate     Other reaction(s): GI UPSET,NAUSEA,VOMITING  . Hydromorphone     Altered mental status  . Ibuprofen   . Phenytoin Sodium Extended Hives  . Codeine Rash    When mixed with Demerol    FAMILY HISTORY: Family History    Problem Relation Age of Onset  . Stroke    . Fragile X syndrome Ramsey     Patient is carrier  . Raynaud syndrome Mother   . Alzheimer's disease Father   . Diabetes Father   . Diabetes Sister   . Cancer Brother     brother  . Cancer Maternal Grandmother     cervical & breast   . Heart failure Maternal Grandfather   . Diabetes Paternal Grandmother     SOCIAL HISTORY: History   Social History  . Marital Status: Married    Spouse Name: N/A  . Number of Children: N/A  . Years of Education: N/A   Occupational History  . Not on  file.   Social History Main Topics  . Smoking status: Never Smoker   . Smokeless tobacco: Never Used  . Alcohol Use: No  . Drug Use: No  . Sexual Activity:    Partners: Male   Other Topics Concern  . Not on file   Social History Narrative    REVIEW OF SYSTEMS: Constitutional: fatigue Eyes: No visual changes, double vision, eye pain Ear, nose and throat: No hearing loss, ear pain, nasal congestion, sore throat Cardiovascular: No chest pain, palpitations Respiratory:  No shortness of breath at rest or with exertion, wheezes GastrointestinaI: No nausea, vomiting, diarrhea, abdominal pain, fecal incontinence Genitourinary:  No dysuria, urinary retention or frequency Musculoskeletal:  No neck pain, back pain Integumentary: No rash, pruritus, skin lesions Neurological: as above Psychiatric: Depression Endocrine: weight gain, mood swings, fatigue Hematologic/Lymphatic:  No anemia, purpura, petechiae. Allergic/Immunologic: no itchy/runny eyes, nasal congestion, recent allergic reactions, rashes  PHYSICAL EXAM: Filed Vitals:   10/10/14 1049  BP: 140/100  Pulse: 90  Resp: 20   General: No acute distress Head:  Normocephalic/atraumatic Eyes:  fundi unremarkable, without vessel changes, exudates, hemorrhages or papilledema. Neck: supple, no paraspinal tenderness, full range of motion Back: No paraspinal tenderness Heart: regular rate and  rhythm Lungs: Clear to auscultation bilaterally. Vascular: No carotid bruits. Neurological Exam: Mental status: alert and oriented to person, place, and time, recent and remote memory intact, fund of knowledge intact, attention and concentration intact, speech fluent and not dysarthric, language intact. MMSE - Mini Mental State Exam 10/10/2014  Orientation to time 5  Orientation to Place 5  Registration 3  Attention/ Calculation 5  Recall 3  Language- name 2 objects 2  Language- repeat 1  Language- follow 3 step command 3  Language- read & follow direction 1  Write a sentence 1  Copy design 1  Total score 30   Cranial nerves: CN I: not tested CN II: pupils equal, round and reactive to light, visual fields intact, fundi unremarkable, without vessel changes, exudates, hemorrhages or papilledema. CN III, IV, VI:  full range of motion, no nystagmus, no ptosis CN V: facial sensation intact CN VII: upper and lower face symmetric CN VIII: hearing intact CN IX, X: gag intact, uvula midline CN XI: sternocleidomastoid and trapezius muscles intact CN XII: tongue midline Bulk & Tone: normal, no fasciculations. Motor:  5/5 throughout Sensation:  Pinprick and vibration intact. Deep Tendon Reflexes:  Absent.  Toes downgoing. Finger to nose testing:  Fine postural and kinetic tremor.  No dysmetria Heel to shin:  No dysmetria Gait:  Wide-based.  Appears to veer a little to the left.  Able to turn.  Cannot walk in tandem. Romberg positive.  IMPRESSION: Fragile X-associated tremor ataxia syndrome History of generalized seizure disorder Dysphagia Dysphonia Chronic phenobarbital use  PLAN: 1.  Will refer for speech and swallow evaluation 2.  Given Brandi fall risk, I would rather she be tapered off of phenobarbital and instead start another antiepileptic agent.  She is hesitant to do this for fear of recurrent seizures.  We decided to get a DEXA scan first.  I also advised to take calcium 500mg   and vitamin D 600 units twice daily.  She will continue phenobarbital 97.5mg  at bedtime. 3.  Will look into possibility for somebody to perform botox to treat the voice tremor 4.   Lexapro has been ineffective for Brandi depression.  We will stop this and instead try citalopram 20mg  daily 5.  Consider referral to endocrinology regarding fatigue  and to assess for any other hormonal changes associated with FXTAS. 6.  Follow up after tests  60 minutes spent with patient, over 50% spent discussing diagnoses and management.  Thank you for allowing me to take part in the care of this patient.  Metta Clines, DO  CC:  Fortunato Curling, MD

## 2014-10-11 ENCOUNTER — Telehealth: Payer: Self-pay | Admitting: *Deleted

## 2014-10-11 ENCOUNTER — Other Ambulatory Visit (HOSPITAL_COMMUNITY): Payer: Self-pay | Admitting: Neurology

## 2014-10-11 DIAGNOSIS — R1314 Dysphagia, pharyngoesophageal phase: Secondary | ICD-10-CM

## 2014-10-11 NOTE — Telephone Encounter (Signed)
Patient is aware of MBS at [facility] on 10/17/14 at  11am . We also discussed issues with the DEXA scan she will [facility] and find out the cost and let us know if she wishes to follow through with it

## 2014-10-17 ENCOUNTER — Ambulatory Visit (HOSPITAL_COMMUNITY): Admission: RE | Admit: 2014-10-17 | Payer: PPO | Source: Ambulatory Visit

## 2014-10-17 ENCOUNTER — Other Ambulatory Visit (HOSPITAL_COMMUNITY): Payer: PPO

## 2014-10-23 ENCOUNTER — Telehealth: Payer: Self-pay | Admitting: *Deleted

## 2014-10-23 ENCOUNTER — Other Ambulatory Visit: Payer: Self-pay | Admitting: *Deleted

## 2014-10-23 DIAGNOSIS — E049 Nontoxic goiter, unspecified: Secondary | ICD-10-CM

## 2014-10-23 NOTE — Telephone Encounter (Signed)
Appt was made with Dr Dwyane Dee. For 11/15/14/ 11:00 am

## 2014-10-23 NOTE — Telephone Encounter (Signed)
Patient follow up on her referral to endo  Call back number [phone]

## 2014-10-24 ENCOUNTER — Telehealth: Payer: Self-pay | Admitting: *Deleted

## 2014-10-24 ENCOUNTER — Telehealth: Payer: Self-pay | Admitting: Endocrinology

## 2014-10-24 ENCOUNTER — Ambulatory Visit (INDEPENDENT_AMBULATORY_CARE_PROVIDER_SITE_OTHER): Payer: PPO | Admitting: Endocrinology

## 2014-10-24 ENCOUNTER — Other Ambulatory Visit: Payer: Self-pay | Admitting: *Deleted

## 2014-10-24 ENCOUNTER — Emergency Department (HOSPITAL_COMMUNITY): Payer: PPO

## 2014-10-24 ENCOUNTER — Encounter (HOSPITAL_COMMUNITY): Payer: Self-pay | Admitting: *Deleted

## 2014-10-24 ENCOUNTER — Observation Stay (HOSPITAL_COMMUNITY)
Admission: EM | Admit: 2014-10-24 | Discharge: 2014-10-25 | Disposition: A | Discharge status: Home / Self Care | Payer: PPO | Attending: Internal Medicine | Admitting: Internal Medicine

## 2014-10-24 ENCOUNTER — Encounter: Payer: Self-pay | Admitting: Endocrinology

## 2014-10-24 VITALS — BP 150/110 | HR 107 | Temp 97.9°F | Resp 16 | Ht 64.5 in | Wt 226.0 lb

## 2014-10-24 DIAGNOSIS — G35 Multiple sclerosis: Secondary | ICD-10-CM | POA: Insufficient documentation

## 2014-10-24 DIAGNOSIS — Q992 Fragile X chromosome: Secondary | ICD-10-CM | POA: Insufficient documentation

## 2014-10-24 DIAGNOSIS — Z79899 Other long term (current) drug therapy: Secondary | ICD-10-CM | POA: Diagnosis not present

## 2014-10-24 DIAGNOSIS — R197 Diarrhea, unspecified: Secondary | ICD-10-CM

## 2014-10-24 DIAGNOSIS — G250 Essential tremor: Secondary | ICD-10-CM

## 2014-10-24 DIAGNOSIS — Z8673 Personal history of transient ischemic attack (TIA), and cerebral infarction without residual deficits: Secondary | ICD-10-CM | POA: Diagnosis not present

## 2014-10-24 DIAGNOSIS — M549 Dorsalgia, unspecified: Secondary | ICD-10-CM | POA: Diagnosis not present

## 2014-10-24 DIAGNOSIS — R000 Tachycardia, unspecified: Secondary | ICD-10-CM | POA: Diagnosis not present

## 2014-10-24 DIAGNOSIS — G119 Hereditary ataxia, unspecified: Secondary | ICD-10-CM

## 2014-10-24 DIAGNOSIS — R5382 Chronic fatigue, unspecified: Secondary | ICD-10-CM | POA: Diagnosis not present

## 2014-10-24 DIAGNOSIS — E042 Nontoxic multinodular goiter: Secondary | ICD-10-CM

## 2014-10-24 DIAGNOSIS — E119 Type 2 diabetes mellitus without complications: Secondary | ICD-10-CM | POA: Diagnosis not present

## 2014-10-24 DIAGNOSIS — F32A Depression, unspecified: Secondary | ICD-10-CM

## 2014-10-24 DIAGNOSIS — G40909 Epilepsy, unspecified, not intractable, without status epilepticus: Secondary | ICD-10-CM | POA: Diagnosis not present

## 2014-10-24 DIAGNOSIS — R079 Chest pain, unspecified: Secondary | ICD-10-CM | POA: Diagnosis present

## 2014-10-24 DIAGNOSIS — F329 Major depressive disorder, single episode, unspecified: Secondary | ICD-10-CM

## 2014-10-24 DIAGNOSIS — I10 Essential (primary) hypertension: Secondary | ICD-10-CM | POA: Insufficient documentation

## 2014-10-24 HISTORY — DX: Cerebral infarction, unspecified: I63.9

## 2014-10-24 LAB — CBC
HCT: 46.1 % — ABNORMAL HIGH (ref 36.0–46.0)
HCT: 42.5 % (ref 36.0–46.0)
Hemoglobin: 15.1 g/dL — ABNORMAL HIGH (ref 12.0–15.0)
Hemoglobin: 14 g/dL (ref 12.0–15.0)
MCH: 27.6 pg (ref 26.0–34.0)
MCH: 27.3 pg (ref 26.0–34.0)
MCHC: 32.8 g/dL (ref 30.0–36.0)
MCHC: 32.9 g/dL (ref 30.0–36.0)
MCV: 84.1 fL (ref 78.0–100.0)
MCV: 83 fL (ref 78.0–100.0)
PLATELETS: 262 10*3/uL (ref 150–400)
Platelets: 316 10*3/uL (ref 150–400)
RBC: 5.48 MIL/uL — ABNORMAL HIGH (ref 3.87–5.11)
RBC: 5.12 MIL/uL — ABNORMAL HIGH (ref 3.87–5.11)
RDW: 14.7 % (ref 11.5–15.5)
RDW: 14.8 % (ref 11.5–15.5)
WBC: 13.2 10*3/uL — AB (ref 4.0–10.5)
WBC: 14.4 10*3/uL — ABNORMAL HIGH (ref 4.0–10.5)

## 2014-10-24 LAB — I-STAT TROPONIN, ED
TROPONIN I, POC: 0 ng/mL (ref 0.00–0.08)
Troponin i, poc: 0 ng/mL (ref 0.00–0.08)

## 2014-10-24 LAB — HEPATIC FUNCTION PANEL
ALT: 29 U/L (ref 14–54)
AST: 28 U/L (ref 15–41)
Albumin: 3.8 g/dL (ref 3.5–5.0)
Alkaline Phosphatase: 116 U/L (ref 38–126)
BILIRUBIN TOTAL: 0.6 mg/dL (ref 0.3–1.2)
Total Protein: 8.1 g/dL (ref 6.5–8.1)

## 2014-10-24 LAB — CREATININE, SERUM
CREATININE: 0.94 mg/dL (ref 0.44–1.00)
GFR calc Af Amer: >60 mL/min (ref >60)
GFR calc non Af Amer: >60 mL/min (ref >60)

## 2014-10-24 LAB — T4, FREE
FREE T4: 0.81 ng/dL (ref 0.60–1.60)
FREE T4: 0.85 ng/dL (ref 0.61–1.12)

## 2014-10-24 LAB — TSH
TSH: 1.8 u[IU]/mL (ref 0.35–4.50)
TSH: 1.219 u[IU]/mL (ref 0.350–4.500)

## 2014-10-24 LAB — D-DIMER, QUANTITATIVE (NOT AT ARMC): D DIMER QUANT: 0.4 ug{FEU}/mL (ref 0.00–0.48)

## 2014-10-24 LAB — BASIC METABOLIC PANEL
ANION GAP: 13 (ref 5–15)
BUN: 15 mg/dL (ref 6–20)
CHLORIDE: 101 mmol/L (ref 101–111)
CO2: 24 mmol/L (ref 22–32)
CREATININE: 1.02 mg/dL — AB (ref 0.44–1.00)
Calcium: 9.4 mg/dL (ref 8.9–10.3)
GFR calc non Af Amer: 58 mL/min — ABNORMAL LOW (ref >60)
Glucose, Bld: 122 mg/dL — ABNORMAL HIGH (ref 70–99)
Potassium: 4.7 mmol/L (ref 3.5–5.1)
Sodium: 138 mmol/L (ref 135–145)

## 2014-10-24 LAB — LIPASE, BLOOD: Lipase: 33 U/L (ref 22–51)

## 2014-10-24 LAB — TROPONIN I: Troponin I: <0.03 ng/mL (ref <0.031)

## 2014-10-24 MED ORDER — DULOXETINE HCL 30 MG PO CPEP
30.0000 mg | ORAL_CAPSULE | Freq: Every day | ORAL | Status: DC
  Administered 2014-10-25: 30 mg via ORAL
  Filled 2014-10-24: qty 1

## 2014-10-24 MED ORDER — METOPROLOL SUCCINATE ER 25 MG PO TB24
25.0000 mg | ORAL_TABLET | Freq: Every day | ORAL | Status: DC
  Administered 2014-10-24: 25 mg via ORAL
  Filled 2014-10-24: qty 1

## 2014-10-24 MED ORDER — ATORVASTATIN CALCIUM 80 MG PO TABS
80.0000 mg | ORAL_TABLET | Freq: Every day | ORAL | Status: DC
  Administered 2014-10-24: 80 mg via ORAL
  Filled 2014-10-24: qty 1

## 2014-10-24 MED ORDER — CALCIUM-VITAMIN D 600-200 MG-UNIT PO TABS: 1.0000 | ORAL_TABLET | Freq: Two times a day (BID) | ORAL | Status: DC

## 2014-10-24 MED ORDER — IOHEXOL 350 MG/ML SOLN
100.0000 mL | Freq: Once | INTRAVENOUS | Status: AC | PRN
  Administered 2014-10-24: 100 mL via INTRAVENOUS

## 2014-10-24 MED ORDER — FAMOTIDINE 20 MG PO TABS
20.0000 mg | ORAL_TABLET | Freq: Two times a day (BID) | ORAL | Status: DC
  Administered 2014-10-24 – 2014-10-25 (×2): 20 mg via ORAL
  Filled 2014-10-24 (×2): qty 1

## 2014-10-24 MED ORDER — MECLIZINE HCL 25 MG PO TABS: 25.0000 mg | ORAL_TABLET | Freq: Four times a day (QID) | ORAL | Status: DC | PRN

## 2014-10-24 MED ORDER — ONDANSETRON HCL 4 MG/2ML IJ SOLN: 4.0000 mg | Freq: Four times a day (QID) | INTRAMUSCULAR | Status: DC | PRN

## 2014-10-24 MED ORDER — SODIUM CHLORIDE 0.9 % IV SOLN
INTRAVENOUS | Status: DC
  Administered 2014-10-24: 23:00:00 via INTRAVENOUS

## 2014-10-24 MED ORDER — CLONAZEPAM 1 MG PO TABS
1.0000 mg | ORAL_TABLET | Freq: Three times a day (TID) | ORAL | Status: DC | PRN
  Administered 2014-10-24: 1 mg via ORAL
  Filled 2014-10-24: qty 1

## 2014-10-24 MED ORDER — CALCIUM CARBONATE-VITAMIN D 500-200 MG-UNIT PO TABS
1.0000 | ORAL_TABLET | Freq: Two times a day (BID) | ORAL | Status: DC
  Administered 2014-10-24 – 2014-10-25 (×2): 1 via ORAL
  Filled 2014-10-24 (×2): qty 1

## 2014-10-24 MED ORDER — FLUTICASONE FUROATE-VILANTEROL 100-25 MCG/INH IN AEPB: 1.0000 | INHALATION_SPRAY | Freq: Every day | RESPIRATORY_TRACT | Status: DC

## 2014-10-24 MED ORDER — TRAMADOL HCL 50 MG PO TABS
50.0000 mg | ORAL_TABLET | Freq: Once | ORAL | Status: AC
  Administered 2014-10-24: 50 mg via ORAL
  Filled 2014-10-24: qty 1

## 2014-10-24 MED ORDER — PHENOBARBITAL 97.2 MG PO TABS
97.2000 mg | ORAL_TABLET | Freq: Every day | ORAL | Status: DC
  Administered 2014-10-24: 97.2 mg via ORAL
  Filled 2014-10-24: qty 1

## 2014-10-24 MED ORDER — BUDESONIDE-FORMOTEROL FUMARATE 160-4.5 MCG/ACT IN AERO
2.0000 | INHALATION_SPRAY | Freq: Two times a day (BID) | RESPIRATORY_TRACT | Status: DC
  Filled 2014-10-24: qty 6

## 2014-10-24 MED ORDER — ONDANSETRON HCL 4 MG PO TABS: 4.0000 mg | ORAL_TABLET | Freq: Four times a day (QID) | ORAL | Status: DC | PRN

## 2014-10-24 MED ORDER — LORATADINE 10 MG PO TABS: 10.0000 mg | ORAL_TABLET | Freq: Every morning | ORAL | Status: DC

## 2014-10-24 MED ORDER — ENOXAPARIN SODIUM 40 MG/0.4ML ~~LOC~~ SOLN: 40.0000 mg | Freq: Every day | SUBCUTANEOUS | Status: DC

## 2014-10-24 MED ORDER — ASPIRIN EC 81 MG PO TBEC
81.0000 mg | DELAYED_RELEASE_TABLET | Freq: Every day | ORAL | Status: DC
  Administered 2014-10-25: 81 mg via ORAL
  Filled 2014-10-24: qty 1

## 2014-10-24 MED ORDER — LORATADINE 10 MG PO TABS
10.0000 mg | ORAL_TABLET | Freq: Every day | ORAL | Status: DC
  Administered 2014-10-25: 10 mg via ORAL
  Filled 2014-10-24: qty 1

## 2014-10-24 MED ORDER — ACETAMINOPHEN 325 MG PO TABS: 650.0000 mg | ORAL_TABLET | Freq: Four times a day (QID) | ORAL | Status: DC | PRN

## 2014-10-24 MED ORDER — PANTOPRAZOLE SODIUM 40 MG PO TBEC
40.0000 mg | DELAYED_RELEASE_TABLET | Freq: Two times a day (BID) | ORAL | Status: DC
  Administered 2014-10-24 – 2014-10-25 (×2): 40 mg via ORAL
  Filled 2014-10-24 (×2): qty 1

## 2014-10-24 MED ORDER — METOPROLOL SUCCINATE ER 25 MG PO TB24
25.0000 mg | ORAL_TABLET | Freq: Every day | ORAL | Status: DC
Start: 2014-10-24 — End: 2014-10-24

## 2014-10-24 MED ORDER — ACETAMINOPHEN 650 MG RE SUPP: 650.0000 mg | Freq: Four times a day (QID) | RECTAL | Status: DC | PRN

## 2014-10-24 MED ORDER — CITALOPRAM HYDROBROMIDE 20 MG PO TABS: 20.0000 mg | ORAL_TABLET | Freq: Every day | ORAL | Status: DC

## 2014-10-24 MED ORDER — MORPHINE SULFATE 4 MG/ML IJ SOLN: 4.0000 mg | Freq: Once | INTRAMUSCULAR | Status: DC

## 2014-10-24 NOTE — ED Notes (Signed)
Appointment time @ 21:10

## 2014-10-24 NOTE — Progress Notes (Signed)
Quick Note:  Please let patient know that the lab result is normal and Needs to follow-up with PCP And ENT surgeon for management of her current problems which are not related to thyroid ______

## 2014-10-24 NOTE — Telephone Encounter (Addendum)
Janette from [place] [facility], need lab orders updated to todays date. For patient

## 2014-10-24 NOTE — ED Notes (Signed)
Patient returned from CT without test. Patient made aware of the plan of care.

## 2014-10-24 NOTE — ED Provider Notes (Signed)
CSN: [id number]     Arrival date & time 10/24/14  1342 History   First MD Initiated Contact with Patient 10/24/14 1418     Chief Complaint  Patient presents with  . Chest Pain     (Consider location/radiation/quality/duration/timing/severity/associated sxs/prior Treatment) HPI  Brandi Ramsey is a 63 y.o. female with PMH of MS, HTN, dyslipidemia, seizures, DM presenting with pressure-like chest pain that started around 1 PM that radiated into her back that started at rest. Pain has been constant. Patient states chest pain has resolved but her back pain has not improved. She reported initial shortness of breath and diaphoresis. No nausea or vomiting. Patient denied exertional symptoms. She denies any alleviating factors. Patient denies cardiac history. She states she had unremarkable stress test 2 years ago. Pt denies history of DVT, PE, recent surgery or trauma, malignancy, hemoptysis, exogenous estrogen use, unilateral leg swelling or tenderness, immobilization.   Past Medical History  Diagnosis Date  . Seizures   . Diabetes mellitus without complication   . Fragile X syndrome     ataxia syndrome.    [name] Hypertension   . Hemorrhoids   . Transient cerebral ischemia   . Vertigo, benign paroxysmal   . MS (multiple sclerosis)   . Cerebellar ataxia    Past Surgical History  Procedure Laterality Date  . Abdominal hysterectomy    . Fracture surgery      tib/fib (R) leg   Family History  Problem Relation Age of Onset  . Stroke    . Fragile X syndrome Son     Patient is carrier  . Raynaud syndrome Mother   . Alzheimer's disease Father   . Diabetes Father   . Diabetes Sister   . Cancer Brother     brother  . Cancer Maternal Grandmother     cervical & breast   . Heart failure Maternal Grandfather   . Diabetes Paternal Grandmother    History  Substance Use Topics  . Smoking status: Never Smoker   . Smokeless tobacco: Never Used  . Alcohol Use: No   OB History    No data  available     Review of Systems 10 Systems reviewed and are negative for acute change except as noted in the HPI.    Allergies  Buprenorphine hcl; Meperidine; Morphine and related; Pentazocine; Pentazocine lactate; Phenytoin; Demerol; Dilaudid; Doxycycline hyclate; Hydromorphone; Ibuprofen; Phenytoin sodium extended; and Codeine  Home Medications   Prior to Admission medications   Medication Sig Start Date End Date Taking? Authorizing Provider  atorvastatin (LIPITOR) 80 MG tablet Take 80 mg by mouth at bedtime.    Yes Historical Provider, MD  BREO ELLIPTA 100-25 MCG/INH AEPB Inhale 1 puff into the lungs daily.  05/12/14  Yes Historical Provider, MD  Calcium-Vitamin D 600-200 MG-UNIT per tablet Take 1 tablet by mouth 2 (two) times daily.   Yes Historical Provider, MD  cetirizine (ZYRTEC) 10 MG tablet Take 10 mg by mouth daily. 05/26/14  Yes Historical Provider, MD  clonazePAM (KLONOPIN) 1 MG tablet Take 1 mg by mouth 3 (three) times daily as needed. For anxiety   Yes Historical Provider, MD  [employer] CALCIUM 600+D 600-800 MG-UNIT TABS Take 1 tablet by mouth 2 (two) times daily with a meal. 07/13/14  Yes Historical Provider, MD  DULoxetine (CYMBALTA) 30 MG capsule Take 30 mg by mouth daily.   Yes Historical Provider, MD  loratadine (CLARITIN) 10 MG tablet Take 10 mg by mouth every morning.   Yes Historical  Provider, MD  meclizine (ANTIVERT) 25 MG tablet Take 25 mg by mouth every 6 (six) hours as needed for dizziness.   Yes Historical Provider, MD  metoprolol succinate (TOPROL-XL) 25 MG 24 hr tablet Take 25 mg by mouth at bedtime.   Yes Historical Provider, MD  pantoprazole (PROTONIX) 40 MG tablet Take 40 mg by mouth 2 (two) times daily.  07/14/14  Yes Historical Provider, MD  PHENobarbital (LUMINAL) 97.2 MG tablet Take 97.2 mg by mouth at bedtime.   Yes Historical Provider, MD  ranitidine (ZANTAC) 150 MG tablet Take 150 mg by mouth 2 (two) times daily as needed (for indigestion).    Yes Historical  Provider, MD  tetrahydrozoline (VISINE) 0.05 % ophthalmic solution Place 2 drops into both eyes 5 (five) times daily as needed (for dry eyes).   Yes Historical Provider, MD  Vitamin D, Ergocalciferol, (DRISDOL) 50000 UNITS CAPS capsule Take 50,000 Units by mouth every Monday.   Yes Historical Provider, MD  citalopram (CELEXA) 20 MG tablet Take 1 tablet (20 mg total) by mouth daily. Patient not taking: Reported on 10/24/2014 10/24/14   Pieter Partridge, DO   BP 123/79 mmHg  Pulse 102  Temp(Src) 98.6 F (37 C) (Oral)  Resp 16  SpO2 100% Physical Exam  Constitutional: She appears well-developed and well-nourished. No distress.  HENT:  Head: Normocephalic and atraumatic.  Eyes: Conjunctivae and EOM are normal. Right eye exhibits no discharge. Left eye exhibits no discharge.  Neck: No JVD present.  Cardiovascular: Normal rate and regular rhythm.   Tachycardia. No leg swelling or tenderness. Negative Homan's sign.  Pulmonary/Chest: Effort normal and breath sounds normal. No respiratory distress. She has no wheezes.  Abdominal: Soft. Bowel sounds are normal. She exhibits no distension. There is no tenderness.  Neurological: She is alert. She exhibits normal muscle tone. Coordination normal.  Skin: Skin is warm and dry. She is not diaphoretic.  Nursing note and vitals reviewed.   ED Course  Procedures (including critical care time) Labs Review Labs Reviewed  CBC - Abnormal; Notable for the following:    WBC 13.2 (*)    RBC 5.48 (*)    Hemoglobin 15.1 (*)    HCT 46.1 (*)    All other components within normal limits  BASIC METABOLIC PANEL - Abnormal; Notable for the following:    Glucose, Bld 122 (*)    Creatinine, Ser 1.02 (*)    GFR calc non Af Amer 58 (*)    All other components within normal limits  HEPATIC FUNCTION PANEL - Abnormal; Notable for the following:    Bilirubin, Direct <0.1 (*)    All other components within normal limits  LIPASE, BLOOD  I-STAT TROPOININ, ED    Imaging  Review Dg Chest 2 View  10/24/2014   CLINICAL DATA:  Chest pain and shortness of Breath following a fall at work this morning  EXAM: CHEST  2 VIEW  COMPARISON:  08/27/2013, 12/01/2013  FINDINGS: Cardiac shadow is within normal limits. The lungs are well aerated bilaterally. No focal infiltrate or sizable effusion is seen. No acute bony abnormality is noted.  IMPRESSION: No acute abnormality seen.   Electronically Signed   By: Inez Catalina M.D.   On: 10/24/2014 14:33     EKG Interpretation   Date/Time:  Tuesday Oct 24 2014 13:54:19 EDT Ventricular Rate:  117 PR Interval:  142 QRS Duration: 66 QT Interval:  322 QTC Calculation: 449 R Axis:   75 Text Interpretation:  Sinus tachycardia Otherwise  normal ECG Confirmed by  Johnney Killian, MD, Jeannie Done [phone]) on 10/24/2014 2:47:59 PM      MDM   Final diagnoses:  Back pain   Patient presenting with one-day history of chest pain radiated to the back with history of hypertension, dyslipidemia and hyperlipidemia. History concerning for aortic dissection. Patient's initial blood pressures elevated but has stabilized without medication. Pulse ranging one teens to 90s. Will hold aspirin and labetalol. CTA chest abdomen ordered. Initial troponin negative EKG without ischemic changes. Chest x-ray without acute abnormalities.  Patient taken to CT and patient's IV infiltrated. IV team has been called for new Angiocath. Plan to proceed with CT angiogram. If negative plan consult cardiology for further recommendations and disposition.  Patient signed out to Dahlia Bailiff PA-C at shift change.  Discussed all results and patient verbalizes understanding and agrees with plan.  This is a shared patient. This patient was discussed with the physician who saw and evaluated the patient and agrees with the plan.  Filed Vitals:   10/24/14 1500 10/24/14 1530 10/24/14 1545 10/24/14 1707  BP: 116/91 118/77 121/81 123/79  Pulse: 105 100 97 102  Temp:      TempSrc:      Resp:  21 20 22 16   SpO2: 99% 96% 96% 100%     Al Corpus, PA-C 10/24/14 [place], PA-C 10/24/14 [place], MD 10/26/14 [phone]

## 2014-10-24 NOTE — ED Provider Notes (Signed)
Physical Exam  BP 134/85 mmHg  Pulse 101  Temp(Src) 99.5 F (37.5 C) (Oral)  Resp 20  Ht 5\' 4"  (1.626 m)  Wt 225 lb 15.5 oz (102.5 kg)  BMI 38.77 kg/m2  SpO2 96%  Physical Exam  Constitutional: She is oriented to person, place, and time. She appears well-developed and well-nourished. No distress.  HENT:  Head: Normocephalic and atraumatic.  Mouth/Throat: Oropharynx is clear and moist. No oropharyngeal exudate.  Eyes: Right eye exhibits no discharge. Left eye exhibits no discharge. No scleral icterus.  Neck: Normal range of motion.  Cardiovascular: Normal rate, regular rhythm and normal heart sounds.   No murmur heard. Pulmonary/Chest: Effort normal and breath sounds normal. No respiratory distress.  Abdominal: Soft. There is no tenderness.  Musculoskeletal: Normal range of motion. She exhibits no edema or tenderness.  Neurological: She is alert and oriented to person, place, and time. No cranial nerve deficit. Coordination normal.  Skin: Skin is warm and dry. No rash noted. She is not diaphoretic.  Psychiatric: She has a normal mood and affect.  Nursing note and vitals reviewed.   ED Course  Procedures  Results for orders placed or performed during the hospital encounter of 10/24/14  CBC  Result Value Ref Range   WBC 13.2 (H) 4.0 - 10.5 K/uL   RBC 5.48 (H) 3.87 - 5.11 MIL/uL   Hemoglobin 15.1 (H) 12.0 - 15.0 g/dL   HCT 46.1 (H) 36.0 - 46.0 %   MCV 84.1 78.0 - 100.0 fL   MCH 27.6 26.0 - 34.0 pg   MCHC 32.8 30.0 - 36.0 g/dL   RDW 14.7 11.5 - 15.5 %   Platelets 316 150 - 400 K/uL  Basic metabolic panel  Result Value Ref Range   Sodium 138 135 - 145 mmol/L   Potassium 4.7 3.5 - 5.1 mmol/L   Chloride 101 101 - 111 mmol/L   CO2 24 22 - 32 mmol/L   Glucose, Bld 122 (H) 70 - 99 mg/dL   BUN 15 6 - 20 mg/dL   Creatinine, Ser 1.02 (H) 0.44 - 1.00 mg/dL   Calcium 9.4 8.9 - 10.3 mg/dL   GFR calc non Af Amer 58 (L) >60 mL/min   GFR calc Af Amer >60 >60 mL/min   Anion gap  13 5 - 15  Lipase, blood  Result Value Ref Range   Lipase 33 22 - 51 U/L  Hepatic function panel  Result Value Ref Range   Total Protein 8.1 6.5 - 8.1 g/dL   Albumin 3.8 3.5 - 5.0 g/dL   AST 28 15 - 41 U/L   ALT 29 14 - 54 U/L   Alkaline Phosphatase 116 38 - 126 U/L   Total Bilirubin 0.6 0.3 - 1.2 mg/dL   Bilirubin, Direct <0.1 (L) 0.1 - 0.5 mg/dL   Indirect Bilirubin NOT CALCULATED 0.3 - 0.9 mg/dL  D-dimer, quantitative  Result Value Ref Range   D-Dimer, Quant 0.40 0.00 - 0.48 ug/mL-FEU  Troponin I (q 6hr x 3)  Result Value Ref Range   Troponin I <0.03 <0.031 ng/mL  CBC  Result Value Ref Range   WBC 14.4 (H) 4.0 - 10.5 K/uL   RBC 5.12 (H) 3.87 - 5.11 MIL/uL   Hemoglobin 14.0 12.0 - 15.0 g/dL   HCT 42.5 36.0 - 46.0 %   MCV 83.0 78.0 - 100.0 fL   MCH 27.3 26.0 - 34.0 pg   MCHC 32.9 30.0 - 36.0 g/dL   RDW 14.8 11.5 -  15.5 %   Platelets 262 150 - 400 K/uL  Creatinine, serum  Result Value Ref Range   Creatinine, Ser 0.94 0.44 - 1.00 mg/dL   GFR calc non Af Amer >60 >60 mL/min   GFR calc Af Amer >60 >60 mL/min  TSH  Result Value Ref Range   TSH 1.219 0.350 - 4.500 uIU/mL  T4, free  Result Value Ref Range   Free T4 0.85 0.61 - 1.12 ng/dL  I-stat troponin, ED  (not at [facility])  Result Value Ref Range   Troponin i, poc 0.00 0.00 - 0.08 ng/mL   Comment 3          I-stat troponin, ED  Result Value Ref Range   Troponin i, poc 0.00 0.00 - 0.08 ng/mL   Comment 3           Ct Abdomen Pelvis Wo Contrast  10/24/2014   CLINICAL DATA:  Back pain, history multiple sclerosis, diabetes, pharyngeal axis syndrome, hypertension  EXAM: CT CHEST, ABDOMEN AND PELVIS WITHOUT CONTRAST  TECHNIQUE: Multidetector CT imaging of the chest, abdomen and pelvis was performed following the standard protocol without IV contrast. Sagittal and coronal MPR images reconstructed from axial data set. Examination was ordered with IV contrast material. The contrast dose extravasated at the RIGHT antecubital  fossa. Patient was examined by Dr. Leonia Reeves. Routine post extravasation orders given. No oral contrast was administered.  COMPARISON:  CT chest 12/01/2013, 09/21/2012  FINDINGS: CT CHEST FINDINGS  Aorta normal caliber.  No significant atherosclerotic calcification or thoracic adenopathy.  4 mm LEFT lung nodule image 23 at major fissure unchanged since 2014.  Remaining lungs clear.  No infiltrate, pleural effusion or pneumothorax.  Osseous structures unremarkable.  CT ABDOMEN AND PELVIS FINDINGS  Liver, gallbladder, spleen, pancreas, and adrenal glands normal.  Small amount of excreted contrast material within renal collecting systems.  Probable cyst upper pole RIGHT kidney 14 x 12 x 11 mm.  Normal appendix.  Intra-abdominal lipomatosis.  Stomach and bowel loops unremarkable for technique.  Normal appearing bladder and ureters.  Uterus surgically absent with normal sized ovaries.  No mass, adenopathy, free fluid or free air.  Tiny umbilical hernia containing fat.  Degenerative disc disease changes L1-L2.  IMPRESSION: No acute intra thoracic, intra-abdominal or intrapelvic abnormalities.  Probable cyst upper pole RIGHT kidney.  Nonspecific 4 mm LEFT lung nodule unchanged since 2014; no further workup required.   Electronically Signed   By: Lavonia Dana M.D.   On: 10/24/2014 19:30   Dg Chest 2 View  10/24/2014   CLINICAL DATA:  Chest pain and shortness of Breath following a fall at work this morning  EXAM: CHEST  2 VIEW  COMPARISON:  08/27/2013, 12/01/2013  FINDINGS: Cardiac shadow is within normal limits. The lungs are well aerated bilaterally. No focal infiltrate or sizable effusion is seen. No acute bony abnormality is noted.  IMPRESSION: No acute abnormality seen.   Electronically Signed   By: Inez Catalina M.D.   On: 10/24/2014 14:33   Ct Chest Wo Contrast  10/24/2014   CLINICAL DATA:  Back pain, history multiple sclerosis, diabetes, pharyngeal axis syndrome, hypertension  EXAM: CT CHEST, ABDOMEN AND PELVIS  WITHOUT CONTRAST  TECHNIQUE: Multidetector CT imaging of the chest, abdomen and pelvis was performed following the standard protocol without IV contrast. Sagittal and coronal MPR images reconstructed from axial data set. Examination was ordered with IV contrast material. The contrast dose extravasated at the RIGHT antecubital fossa. Patient was examined by Dr.  Edmunds. Routine post extravasation orders given. No oral contrast was administered.  COMPARISON:  CT chest 12/01/2013, 09/21/2012  FINDINGS: CT CHEST FINDINGS  Aorta normal caliber.  No significant atherosclerotic calcification or thoracic adenopathy.  4 mm LEFT lung nodule image 23 at major fissure unchanged since 2014.  Remaining lungs clear.  No infiltrate, pleural effusion or pneumothorax.  Osseous structures unremarkable.  CT ABDOMEN AND PELVIS FINDINGS  Liver, gallbladder, spleen, pancreas, and adrenal glands normal.  Small amount of excreted contrast material within renal collecting systems.  Probable cyst upper pole RIGHT kidney 14 x 12 x 11 mm.  Normal appendix.  Intra-abdominal lipomatosis.  Stomach and bowel loops unremarkable for technique.  Normal appearing bladder and ureters.  Uterus surgically absent with normal sized ovaries.  No mass, adenopathy, free fluid or free air.  Tiny umbilical hernia containing fat.  Degenerative disc disease changes L1-L2.  IMPRESSION: No acute intra thoracic, intra-abdominal or intrapelvic abnormalities.  Probable cyst upper pole RIGHT kidney.  Nonspecific 4 mm LEFT lung nodule unchanged since 2014; no further workup required.   Electronically Signed   By: Lavonia Dana M.D.   On: 10/24/2014 19:30    MDM Patient signed out to me by Al Corpus, PA-C with plan to follow-up on imaging of CT angiogram, and consult cardiology after delta troponin. Patient is here and has been worked up for evaluation of a chest pain which began acutely at 1 PM this afternoon radiated to her chest and neck. There is some concern  for aortic dissection versus cardiac etiology of patient's pain. Multiple IV attempts have failed throughout ER stay, during the angiogram, patient's IV has extravasated 2. Decision was made to continue CT of chest and abdomen without contrast. This study was negative for any acute pathology. Patient has been pain-free during my entire contact. D-dimer was noted to be normal here which is less likely to be aortic dissection, and has a high negative predictive value of PE. I spoke with Dr. Aundra Dubin with cardiology in consultation patient's case who agrees that it would be reasonable at this point to keep patient for ACS rule out. Patient will be admitted to medicine for ACS rule out based on heart score of 5. The patient appears reasonably stabilized for admission considering the current resources, flow, and capabilities available in the ED at this time, and I doubt any other [facility] requiring further screening and/or treatment in the ED prior to admission.   Signed,  Dahlia Bailiff, PA-C 2:17 AM        Dahlia Bailiff, PA-C 10/25/14 1601  Alfonzo Beers, MD 10/25/14 [phone]

## 2014-10-24 NOTE — Telephone Encounter (Signed)
Patient came by the office today in tears after office visit with Dr Dwyane Dee . She states he was so rude and belittled her and her husband she felt he treated her as if she was stupid . She has a Goiter that has been getting bigger and causing some issues with swallowing so an appt was made for a visit per Dr [name]. She ask his when he ordered labs if he would order the antibodies for the thyroid and he told her she states "you have been reading too much on the computer " he did not order these . She also had a fall while in the building on the way to the lab and is having some rib and side pain I advised her to go to the ED if she was in pain to make sure no bones were broken ibuprofen sent rx for citalopram 20 mg  to her pharmacy . She is asking if we would order the antibodies for the thyroid .

## 2014-10-24 NOTE — ED Notes (Signed)
MD Pfeiffer at the bedside 

## 2014-10-24 NOTE — ED Notes (Signed)
Pt states that she began having chest and upper back pain and pressure this morning after falling at work. Pt states that pain radiates to her neck. Pt reports pain as spasms.

## 2014-10-24 NOTE — H&P (Addendum)
[facility] History and Physical  Brandi Ramsey BHA:[id number] DOB: 11/28/1950 DOA: 10/24/2014  Referring physician: Mr. Tamala Julian. PA. PCP: Reeves Dam, MD  Specialists: Dr. Dwyane Dee. Endocrinologist.  Chief Complaint: Chest pain.  HPI: Brandi Ramsey is a 64 y.o. female with history of fragile X syndrome with ataxia, chronic tachycardia, seizures, hyperlipidemia presents to the ER because of chest pain. Patient has been having pain in the upper back with a chest in the morning. Patient chest pain lasted until patient came to the ER. Resolved without any intervention. Patient also was diaphoretic. Since the pain was in the mid back radiating to the neck CT chest and abdomen was done but was done without contrast as patient's IV infiltrated. CT chest and abdomen was unremarkable. In addition patient states she has had multiple episodes of diarrhea today. Patient has been on antibiotics 2 weeks ago for UTI. Patient has been admitted for further observation for chest pain and diarrhea.   Review of Systems: As presented in the history of presenting illness, rest negative.  Past Medical History  Diagnosis Date  . Seizures   . Diabetes mellitus without complication   . Fragile X syndrome     ataxia syndrome.    [name] Hypertension   . Hemorrhoids   . Transient cerebral ischemia   . Vertigo, benign paroxysmal   . MS (multiple sclerosis)   . Cerebellar ataxia    Past Surgical History  Procedure Laterality Date  . Abdominal hysterectomy    . Fracture surgery      tib/fib (R) leg   Social History:  reports that she has never smoked. She has never used smokeless tobacco. She reports that she does not drink alcohol or use illicit drugs. Where does patient live home. Can patient participate in ADLs? Yes.  Allergies  Allergen Reactions  . Buprenorphine Hcl Hives and Itching  . Meperidine Hives and Itching    Other reaction(s): HIVES,ITCHING  . Morphine And Related Hives and Itching  .  Pentazocine Anaphylaxis  . Pentazocine Lactate Anaphylaxis  . Phenytoin Hives    Other reaction(s): RASH  . Demerol Hives and Itching  . Dilaudid [Hydromorphone Hcl]     Hallucinations and felt crazy   . Doxycycline Hyclate     Other reaction(s): GI UPSET,NAUSEA,VOMITING  . Hydromorphone     Altered mental status  . Ibuprofen     Contraindicated for risk of GI bleeding.  [name] Phenytoin Sodium Extended Hives  . Codeine Rash    When mixed with Demerol Patient reports no reaction with Codeine/APAP (Tylenol #3)    Family History:  Family History  Problem Relation Age of Onset  . Stroke    . Fragile X syndrome Son     Patient is carrier  . Raynaud syndrome Mother   . Alzheimer's disease Father   . Diabetes Father   . Diabetes Sister   . Cancer Brother     brother  . Cancer Maternal Grandmother     cervical & breast   . Heart failure Maternal Grandfather   . Diabetes Paternal Grandmother       Prior to Admission medications   Medication Sig Start Date End Date Taking? Authorizing Provider  atorvastatin (LIPITOR) 80 MG tablet Take 80 mg by mouth at bedtime.    Yes Historical Provider, MD  BREO ELLIPTA 100-25 MCG/INH AEPB Inhale 1 puff into the lungs daily.  05/12/14  Yes Historical Provider, MD  Calcium-Vitamin D 600-200 MG-UNIT per tablet Take 1 tablet  by mouth 2 (two) times daily.   Yes Historical Provider, MD  cetirizine (ZYRTEC) 10 MG tablet Take 10 mg by mouth daily. 05/26/14  Yes Historical Provider, MD  clonazePAM (KLONOPIN) 1 MG tablet Take 1 mg by mouth 3 (three) times daily as needed. For anxiety   Yes Historical Provider, MD  [employer] CALCIUM 600+D 600-800 MG-UNIT TABS Take 1 tablet by mouth 2 (two) times daily with a meal. 07/13/14  Yes Historical Provider, MD  DULoxetine (CYMBALTA) 30 MG capsule Take 30 mg by mouth daily.   Yes Historical Provider, MD  loratadine (CLARITIN) 10 MG tablet Take 10 mg by mouth every morning.   Yes Historical Provider, MD  meclizine (ANTIVERT)  25 MG tablet Take 25 mg by mouth every 6 (six) hours as needed for dizziness.   Yes Historical Provider, MD  metoprolol succinate (TOPROL-XL) 25 MG 24 hr tablet Take 25 mg by mouth at bedtime.   Yes Historical Provider, MD  pantoprazole (PROTONIX) 40 MG tablet Take 40 mg by mouth 2 (two) times daily.  07/14/14  Yes Historical Provider, MD  PHENobarbital (LUMINAL) 97.2 MG tablet Take 97.2 mg by mouth at bedtime.   Yes Historical Provider, MD  ranitidine (ZANTAC) 150 MG tablet Take 150 mg by mouth 2 (two) times daily as needed (for indigestion).    Yes Historical Provider, MD  tetrahydrozoline (VISINE) 0.05 % ophthalmic solution Place 2 drops into both eyes 5 (five) times daily as needed (for dry eyes).   Yes Historical Provider, MD  Vitamin D, Ergocalciferol, (DRISDOL) 50000 UNITS CAPS capsule Take 50,000 Units by mouth every Monday.   Yes Historical Provider, MD  citalopram (CELEXA) 20 MG tablet Take 1 tablet (20 mg total) by mouth daily. Patient not taking: Reported on 10/24/2014 10/24/14   Pieter Partridge, DO    Physical Exam: Filed Vitals:   10/24/14 1921 10/24/14 1945 10/24/14 2000 10/24/14 2102  BP: 110/82 135/82 133/90 129/87  Pulse: 105 110 104 106  Temp:    98.3 F (36.8 C)  TempSrc:    Oral  Resp: 16 23 18 23   SpO2: 98% 99% 98% 95%     General:  Moderately built and nourished.  Eyes: Anicteric no pallor.  ENT: No discharge from the ears eyes nose and mouth.  Neck: Goiter.  Cardiovascular: S1 and S2 heard.  Respiratory: No rhonchi or crepitations.  Abdomen: Soft nontender bowel sounds present.  Skin: No rash.  Musculoskeletal: No edema.  Psychiatric: Appears normal.  Neurologic: Alert awake oriented to time place and person. Moves all extremities.  Labs on Admission:  Basic Metabolic Panel:  Recent Labs Lab 10/24/14 1359  NA 138  K 4.7  CL 101  CO2 24  GLUCOSE 122*  BUN 15  CREATININE 1.02*  CALCIUM 9.4   Liver Function Tests:  Recent Labs Lab  10/24/14 1457  AST 28  ALT 29  ALKPHOS 116  BILITOT 0.6  PROT 8.1  ALBUMIN 3.8    Recent Labs Lab 10/24/14 1457  LIPASE 33   No results for input(s): AMMONIA in the last 168 hours. CBC:  Recent Labs Lab 10/24/14 1359  WBC 13.2*  HGB 15.1*  HCT 46.1*  MCV 84.1  PLT 316   Cardiac Enzymes: No results for input(s): CKTOTAL, CKMB, CKMBINDEX, TROPONINI in the last 168 hours.  BNP (last 3 results) No results for input(s): BNP in the last 8760 hours.  ProBNP (last 3 results) No results for input(s): PROBNP in the last 8760 hours.  CBG: No results for input(s): GLUCAP in the last 168 hours.  Radiological Exams on Admission: Ct Abdomen Pelvis Wo Contrast  10/24/2014   CLINICAL DATA:  Back pain, history multiple sclerosis, diabetes, pharyngeal axis syndrome, hypertension  EXAM: CT CHEST, ABDOMEN AND PELVIS WITHOUT CONTRAST  TECHNIQUE: Multidetector CT imaging of the chest, abdomen and pelvis was performed following the standard protocol without IV contrast. Sagittal and coronal MPR images reconstructed from axial data set. Examination was ordered with IV contrast material. The contrast dose extravasated at the RIGHT antecubital fossa. Patient was examined by Dr. Leonia Reeves. Routine post extravasation orders given. No oral contrast was administered.  COMPARISON:  CT chest 12/01/2013, 09/21/2012  FINDINGS: CT CHEST FINDINGS  Aorta normal caliber.  No significant atherosclerotic calcification or thoracic adenopathy.  4 mm LEFT lung nodule image 23 at major fissure unchanged since 2014.  Remaining lungs clear.  No infiltrate, pleural effusion or pneumothorax.  Osseous structures unremarkable.  CT ABDOMEN AND PELVIS FINDINGS  Liver, gallbladder, spleen, pancreas, and adrenal glands normal.  Small amount of excreted contrast material within renal collecting systems.  Probable cyst upper pole RIGHT kidney 14 x 12 x 11 mm.  Normal appendix.  Intra-abdominal lipomatosis.  Stomach and bowel loops  unremarkable for technique.  Normal appearing bladder and ureters.  Uterus surgically absent with normal sized ovaries.  No mass, adenopathy, free fluid or free air.  Tiny umbilical hernia containing fat.  Degenerative disc disease changes L1-L2.  IMPRESSION: No acute intra thoracic, intra-abdominal or intrapelvic abnormalities.  Probable cyst upper pole RIGHT kidney.  Nonspecific 4 mm LEFT lung nodule unchanged since 2014; no further workup required.   Electronically Signed   By: Lavonia Dana M.D.   On: 10/24/2014 19:30   Dg Chest 2 View  10/24/2014   CLINICAL DATA:  Chest pain and shortness of Breath following a fall at work this morning  EXAM: CHEST  2 VIEW  COMPARISON:  08/27/2013, 12/01/2013  FINDINGS: Cardiac shadow is within normal limits. The lungs are well aerated bilaterally. No focal infiltrate or sizable effusion is seen. No acute bony abnormality is noted.  IMPRESSION: No acute abnormality seen.   Electronically Signed   By: Inez Catalina M.D.   On: 10/24/2014 14:33   Ct Chest Wo Contrast  10/24/2014   CLINICAL DATA:  Back pain, history multiple sclerosis, diabetes, pharyngeal axis syndrome, hypertension  EXAM: CT CHEST, ABDOMEN AND PELVIS WITHOUT CONTRAST  TECHNIQUE: Multidetector CT imaging of the chest, abdomen and pelvis was performed following the standard protocol without IV contrast. Sagittal and coronal MPR images reconstructed from axial data set. Examination was ordered with IV contrast material. The contrast dose extravasated at the RIGHT antecubital fossa. Patient was examined by Dr. Leonia Reeves. Routine post extravasation orders given. No oral contrast was administered.  COMPARISON:  CT chest 12/01/2013, 09/21/2012  FINDINGS: CT CHEST FINDINGS  Aorta normal caliber.  No significant atherosclerotic calcification or thoracic adenopathy.  4 mm LEFT lung nodule image 23 at major fissure unchanged since 2014.  Remaining lungs clear.  No infiltrate, pleural effusion or pneumothorax.  Osseous  structures unremarkable.  CT ABDOMEN AND PELVIS FINDINGS  Liver, gallbladder, spleen, pancreas, and adrenal glands normal.  Small amount of excreted contrast material within renal collecting systems.  Probable cyst upper pole RIGHT kidney 14 x 12 x 11 mm.  Normal appendix.  Intra-abdominal lipomatosis.  Stomach and bowel loops unremarkable for technique.  Normal appearing bladder and ureters.  Uterus surgically absent with normal sized ovaries.  No mass, adenopathy, free fluid or free air.  Tiny umbilical hernia containing fat.  Degenerative disc disease changes L1-L2.  IMPRESSION: No acute intra thoracic, intra-abdominal or intrapelvic abnormalities.  Probable cyst upper pole RIGHT kidney.  Nonspecific 4 mm LEFT lung nodule unchanged since 2014; no further workup required.   Electronically Signed   By: Lavonia Dana M.D.   On: 10/24/2014 19:30    EKG: Independently reviewed. Sinus tachycardia.  Assessment/Plan Principal Problem:   Chest pain Active Problems:   Convulsions, epileptic   Fragile X associated tremor ataxia syndrome   Diarrhea   Tachycardia   1. Chest pain - pain is mostly in the upper back of the chest radiating to the neck and present a chest pain-free. Patient also was diaphoretic at that time. We will cycle cardiac markers. Check 2-D echo. Continue metoprolol and statins. Aspirin. 2. Diarrhea - since patient was recently on antibiotics we will check stool for C. Difficile. 3. History of seizures on phenobarbital. 4. Chronic tachycardia - continue metoprolol. Check thyroid function test. 5. Leukocytosis - patient is mildly febrile. CT chest and abdomen is unremarkable. Check urinalysis. Follow stool for C. difficile. 6. Goiter - patient is following up with Dr. Dwyane Dee. Check thyroid function tests. 7. History of fragile X syndrome. 8. History of hyperlipidemia on statins.   DVT Prophylaxis Lovenox.  Code Status: Full code.  Family Communication: Patient's husband at the  bedside.  Disposition Plan: Admit for observation.    KAKRAKANDY,ARSHAD N. [facility] Pager [phone].  If 7PM-7AM, please contact night-coverage [email or web address] Password [email or web address] 10/24/2014, 9:52 PM

## 2014-10-24 NOTE — ED Notes (Signed)
Patient returned from [place]. IV access lost.

## 2014-10-24 NOTE — ED Notes (Signed)
PA Tori at the bedside.

## 2014-10-24 NOTE — Progress Notes (Signed)
Patient ID: Brandi Ramsey, female   DOB: 02/03/1951, 64 y.o.   MRN: [id number]           Reason for Appointment: "Goiter", new consultation    History of Present Illness:   She was apparently being evaluated for a palpable left thyroid enlargement by her PCP with an ultrasound in 01/2014. However her ultrasound at that time showed a normal left thyroid lobe measuring 3.2 cm She also had a 4.4 cm measurement of the right thyroid lobe not described as enlarged by the radiologist She was found to have small bilateral cysts and nodules, the nodules being 5 mm or less and she had a 9 mm complex cyst in the left lobe  The patient's main complaint has been tendency to choking, hoarseness and some difficulty swallowing which has been going on for several months She was seen by ENT physicians last year and had not been given any specific diagnosis related to her voice been hoarse and choking problems  The patient thinks that she feels her Enlarged thyroid and it is getting bigger and affecting her voice and causing choking. She also has complaints of increasing fatigue since at least 12/15 She does not have any cold intolerance and tends to feel hot and occasionally may have hot flashes She tends to have cold feet. She complains of dry skin especially on her legs and has had significant depression  TSH level was 1.1 in 3/15 and 1.3 in 8/15 Free T4 was 0.72 in 8/15 from PCP  No results found for: FREET4, TSH      Medication List       This list is accurate as of: 10/24/14 11:33 AM.  Always use your most recent med list.               albuterol 108 (90 BASE) MCG/ACT inhaler  Commonly known as:  PROVENTIL HFA;VENTOLIN HFA  Inhale 1-2 puffs into the lungs every 6 (six) hours as needed for wheezing or shortness of breath.     atorvastatin 80 MG tablet  Commonly known as:  LIPITOR  Take 80 mg by mouth at bedtime.     BREO ELLIPTA 100-25 MCG/INH Aepb  Generic drug:  Fluticasone  Furoate-Vilanterol     Calcium-Vitamin D 600-200 MG-UNIT per tablet  Take 1 tablet by mouth 2 (two) times daily.     cetirizine 10 MG tablet  Commonly known as:  ZYRTEC  Take 10 mg by mouth daily.     clonazePAM 1 MG tablet  Commonly known as:  KLONOPIN  Take 1 mg by mouth 3 (three) times daily as needed. For anxiety     [employer] CALCIUM 600+D 600-800 MG-UNIT Tabs  Generic drug:  Calcium Carb-Cholecalciferol  Take 1 tablet by mouth 2 (two) times daily with a meal.     DULoxetine 60 MG capsule  Commonly known as:  CYMBALTA     DULoxetine 30 MG capsule  Commonly known as:  CYMBALTA  Take 30 mg by mouth daily.     Fluticasone-Salmeterol 250-50 MCG/DOSE Aepb  Commonly known as:  ADVAIR  Inhale 1 puff into the lungs 2 (two) times daily.     loratadine 10 MG tablet  Commonly known as:  CLARITIN  Take 10 mg by mouth every morning.     meclizine 25 MG tablet  Commonly known as:  ANTIVERT  Take 25 mg by mouth every 6 (six) hours as needed for dizziness.     metoprolol succinate 25 MG 24  hr tablet  Commonly known as:  TOPROL-XL  Take 25 mg by mouth at bedtime.     omeprazole 40 MG capsule  Commonly known as:  PRILOSEC  Take 40 mg by mouth every morning.     pantoprazole 40 MG tablet  Commonly known as:  PROTONIX     PHENobarbital 97.2 MG tablet  Commonly known as:  LUMINAL  Take 97.2 mg by mouth at bedtime.     ranitidine 150 MG tablet  Commonly known as:  ZANTAC  Take 150-300 mg by mouth 2 (two) times daily as needed (for indigestion).     VISINE 0.05 % ophthalmic solution  Generic drug:  tetrahydrozoline  Place 2 drops into both eyes 5 (five) times daily as needed (for dry eyes).     Vitamin D (Ergocalciferol) 50000 UNITS Caps capsule  Commonly known as:  DRISDOL  Take 50,000 Units by mouth every Monday.        Allergies:  Allergies  Allergen Reactions  . Buprenorphine Hcl Hives and Itching  . Meperidine Hives and Itching    Other reaction(s): HIVES,ITCHING    . Morphine And Related Hives and Itching  . Pentazocine Anaphylaxis  . Pentazocine Lactate Anaphylaxis  . Phenytoin Hives    Other reaction(s): RASH  . Demerol Hives and Itching  . Dilaudid [Hydromorphone Hcl]     Hallucinations and felt crazy   . Doxycycline Hyclate     Other reaction(s): GI UPSET,NAUSEA,VOMITING  . Hydromorphone     Altered mental status  . Ibuprofen   . Phenytoin Sodium Extended Hives  . Codeine Rash    When mixed with Demerol    Past Medical History  Diagnosis Date  . Seizures   . Diabetes mellitus without complication   . Fragile X syndrome     ataxia syndrome.    [name] Hypertension   . Hemorrhoids   . Transient cerebral ischemia   . Vertigo, benign paroxysmal   . MS (multiple sclerosis)   . Cerebellar ataxia     Past Surgical History  Procedure Laterality Date  . Abdominal hysterectomy    . Fracture surgery      tib/fib (R) leg    Family History  Problem Relation Age of Onset  . Stroke    . Fragile X syndrome Ramsey     Patient is carrier  . Raynaud syndrome Mother   . Alzheimer's disease Father   . Diabetes Father   . Diabetes Sister   . Cancer Brother     brother  . Cancer Maternal Grandmother     cervical & breast   . Heart failure Maternal Grandfather   . Diabetes Paternal Grandmother     Social History:  reports that she has never smoked. She has never used smokeless tobacco. She reports that she does not drink alcohol or use illicit drugs.   Review of Systems:  ROS  She does not have any cold intolerance and tends to feel hot and occasionally may have hot flashes  She tends to have cold feet. He complains of dry skin and has had significant depression She is being treated with Cymbalta and Celexa for depression  She has been diagnosed to have fragile X syndrome She has had some problems with tremor. Has been followed by neurologist  She is being managed by her PCP for type 2 diabetes and last A1c was 6.7 in 7/15,  currently taking no medications  She thinks she has had difficulty with progressive weight gain  She is being treated with Advair for asthma  She is on treatment for hypercholesterolemia         Examination:   BP 150/110 mmHg  Pulse 107  Temp(Src) 97.9 F (36.6 C)  Resp 16  Ht 5' 4.5" (1.638 m)  Wt 226 lb (102.513 kg)  BMI 38.21 kg/m2  SpO2 97%   General Appearance:  she is very anxious and at times tearful         Eyes: No abnormal prominence or eyelid swelling.      Oral mucosa appears normal      Neck: The thyroid is NOT palpable or enlarged . There is no lymphadenopathy in the neck .    Cardiovascular: Normal  heart sounds, no murmur Respiratory:  Lungs clear Neurological: REFLEXES: at biceps are normal.  Unable to elicit reflexes at ankles Skin: no rash or obvious dryness or scaling         Assessment/Plan:  History of subcentimeter nodules in the thyroid in 2015. Discussed that these are generally incidental and of no clinical importance She has no palpable or enlarged thyroid both clinically and based on the ultrasound she had last year Given the incidental nature of the very small thyroid nodules or cysts there is no need to repeat an ultrasound at this time  Her thyroid cannot explain her significant symptoms of dysphagia, choking and hoarseness She does need to have these reevaluated from her ENT surgeon  She has marked FATIGUE which is also not explained by thyroid functions which had been consistently normal in the past She appears to have significant persistent depression despite taking couple of medications Also her weight gain is likely to be related to thyroid problems and reassured her that the only relevant tests for thyroid are free T4 and TSH and she does not need esoteric tests likely worse T3 which have no bearing on thyroid function  She will have follow-up free T4 and TSH done today and reports will be sent to her PCP and patient will be  informed  Follow-up will be as needed   [facility] 10/24/2014

## 2014-10-24 NOTE — ED Notes (Addendum)
Pt c/o headache at present; denies back and chest pain. Pt c/o nausea and queasiness; given coca cola per request; awaiting results. Ice pack removed from R upper forearm from extravasation of contrast from CT

## 2014-10-24 NOTE — ED Notes (Addendum)
IV Team at the bedside. 

## 2014-10-24 NOTE — ED Notes (Addendum)
Phlebotomy at the bedside. CT called about patient's availability.

## 2014-10-24 NOTE — ED Notes (Signed)
IV team at bedside 

## 2014-10-24 NOTE — Progress Notes (Signed)
Brandi Ramsey experienced an extravasation during CTA C/A/P. Dr. Leonia Reeves was immediately notified and checked status of extravasation. Pt was given contrast extravasation form and given instructions to ice and elevate arm (instructions were also scanned into EPIC). Mr. Dahlia Bailiff in the ED changed the order to CT Abdomen Pelvis W/O contrast since Chest W/O had already been completed in original scan.

## 2014-10-24 NOTE — ED Notes (Signed)
Dr. Kakrakandy at bedside. 

## 2014-10-25 ENCOUNTER — Telehealth: Payer: Self-pay | Admitting: Endocrinology

## 2014-10-25 ENCOUNTER — Other Ambulatory Visit: Payer: Self-pay | Admitting: Cardiology

## 2014-10-25 ENCOUNTER — Telehealth: Payer: Self-pay | Admitting: Cardiology

## 2014-10-25 DIAGNOSIS — R079 Chest pain, unspecified: Secondary | ICD-10-CM | POA: Diagnosis not present

## 2014-10-25 LAB — CBC
HEMATOCRIT: 42.9 % (ref 36.0–46.0)
HEMOGLOBIN: 13.9 g/dL (ref 12.0–15.0)
MCH: 27.3 pg (ref 26.0–34.0)
MCHC: 32.4 g/dL (ref 30.0–36.0)
MCV: 84.3 fL (ref 78.0–100.0)
Platelets: 280 10*3/uL (ref 150–400)
RBC: 5.09 MIL/uL (ref 3.87–5.11)
RDW: 15 % (ref 11.5–15.5)
WBC: 12.7 10*3/uL — ABNORMAL HIGH (ref 4.0–10.5)

## 2014-10-25 LAB — BASIC METABOLIC PANEL
Anion gap: 10 (ref 5–15)
BUN: 13 mg/dL (ref 6–20)
CHLORIDE: 102 mmol/L (ref 101–111)
CO2: 24 mmol/L (ref 22–32)
Calcium: 9 mg/dL (ref 8.9–10.3)
Creatinine, Ser: 0.87 mg/dL (ref 0.44–1.00)
GFR calc Af Amer: >60 mL/min (ref >60)
GFR calc non Af Amer: >60 mL/min (ref >60)
GLUCOSE: 137 mg/dL — AB (ref 70–99)
Potassium: 4.5 mmol/L (ref 3.5–5.1)
Sodium: 136 mmol/L (ref 135–145)

## 2014-10-25 LAB — CLOSTRIDIUM DIFFICILE BY PCR: Toxigenic C. Difficile by PCR: NEGATIVE

## 2014-10-25 LAB — TROPONIN I: Troponin I: <0.03 ng/mL (ref <0.031)

## 2014-10-25 MED ORDER — ASPIRIN 81 MG PO TBEC: 81.0000 mg | DELAYED_RELEASE_TABLET | Freq: Every day | ORAL | Status: DC

## 2014-10-25 NOTE — Telephone Encounter (Signed)
Spoke with pt and relayed the info that Dr. Cruzita Lederer would not be able to see her because there was nothing more she could offer. Also, that Dr. Dwyane Dee would not be willing to order the further testing because his decision was based on her previous results and during examination he saw no evidence of her thyroid not being normal. Pt was upset, informed me that she went to the ER yesterday due to stress and tachycardia because of his treatment at the appt. She did come to a [place] facility. The pt again stated her thoughts that he was rude. She will have no further appts with this office at this time.

## 2014-10-25 NOTE — Consult Note (Addendum)
Admit date: 10/24/2014 Referring Physician  Dr. Sloan Leiter Primary Physician  Dr. Fortunato Curling Primary Cardiologist  None Reason for Consultation  Chest pain  HPI: Brandi Ramsey is a 64 y.o. female with history of fragile X syndrome with ataxia, chronic tachycardia, seizures, hyperlipidemia presents to the ER because of severe sharp back pain. She says that she had been at her endocrinologist's office and got made at him and was very upset when he left the office.  She got in the car and reached over to pull her seat belt and secured the belt and had sudden onset of severe sharp pain in her upper back that radiated into her neck on both sides and straight through to her chest.  It was worse with movement and deep breathing.  The pain persisted until she got to the ER and subsided with no treatment.  She had another very brief episode last night in her back when she turned over.  She denies any chest discomfort with exertion at home.  She does have chronic DOE which she thinks has gotten worse but she has also had weight gain recently.  Since the pain was in the mid back radiating to the neck CT chest and abdomen was done but was done without contrast as patient's IV infiltrated. CT chest and abdomen was unremarkable. In addition patient states she has had multiple episodes of diarrhea but has irritable bowel. Patient has been on antibiotics 2 weeks ago for UTI. Patient was admitted for further observation for chest pain and diarrhea. Cardiac enzymes have been negative x 3 and Cardiology is now asked to consult for further evaluation.  She has not had any further pain and gives no history of angina at home.     PMH:   Past Medical History  Diagnosis Date  . Seizures   . Diabetes mellitus without complication   . Fragile X syndrome     ataxia syndrome.    [name] Hypertension   . Hemorrhoids   . Transient cerebral ischemia   . Vertigo, benign paroxysmal   . MS (multiple sclerosis)   . Cerebellar  ataxia   . Stroke      PSH:   Past Surgical History  Procedure Laterality Date  . Abdominal hysterectomy    . Fracture surgery      tib/fib (R) leg    Allergies:  Buprenorphine hcl; Meperidine; Morphine and related; Pentazocine; Pentazocine lactate; Phenytoin; Demerol; Dilaudid; Doxycycline hyclate; Hydromorphone; Ibuprofen; Phenytoin sodium extended; and Codeine Prior to Admit Meds:   Prescriptions prior to admission  Medication Sig Dispense Refill Last Dose  . atorvastatin (LIPITOR) 80 MG tablet Take 80 mg by mouth at bedtime.    10/23/2014 at Unknown time  . BREO ELLIPTA 100-25 MCG/INH AEPB Inhale 1 puff into the lungs daily.   11 10/24/2014 at Unknown time  . Calcium-Vitamin D 600-200 MG-UNIT per tablet Take 1 tablet by mouth 2 (two) times daily.   10/24/2014 at Unknown time  . cetirizine (ZYRTEC) 10 MG tablet Take 10 mg by mouth daily.  5 10/24/2014 at Unknown time  . clonazePAM (KLONOPIN) 1 MG tablet Take 1 mg by mouth 3 (three) times daily as needed. For anxiety   10/24/2014 at Unknown time  . [employer] CALCIUM 600+D 600-800 MG-UNIT TABS Take 1 tablet by mouth 2 (two) times daily with a meal.  3 10/24/2014 at Unknown time  . DULoxetine (CYMBALTA) 30 MG capsule Take 30 mg by mouth daily.   10/24/2014 at  Unknown time  . loratadine (CLARITIN) 10 MG tablet Take 10 mg by mouth every morning.   10/24/2014 at Unknown time  . meclizine (ANTIVERT) 25 MG tablet Take 25 mg by mouth every 6 (six) hours as needed for dizziness.   Past Month at Unknown time  . metoprolol succinate (TOPROL-XL) 25 MG 24 hr tablet Take 25 mg by mouth at bedtime.   10/24/2014 at 0700  . pantoprazole (PROTONIX) 40 MG tablet Take 40 mg by mouth 2 (two) times daily.   3 10/24/2014 at Unknown time  . PHENobarbital (LUMINAL) 97.2 MG tablet Take 97.2 mg by mouth at bedtime.   10/23/2014 at Unknown time  . ranitidine (ZANTAC) 150 MG tablet Take 150 mg by mouth 2 (two) times daily as needed (for indigestion).    10/23/2014 at Unknown time  .  tetrahydrozoline (VISINE) 0.05 % ophthalmic solution Place 2 drops into both eyes 5 (five) times daily as needed (for dry eyes).   Past Week at Unknown time  . Vitamin D, Ergocalciferol, (DRISDOL) 50000 UNITS CAPS capsule Take 50,000 Units by mouth every Monday.   Past Month at Unknown time  . citalopram (CELEXA) 20 MG tablet Take 1 tablet (20 mg total) by mouth daily. (Patient not taking: Reported on 10/24/2014) 30 tablet 3 Not Taking at Unknown time   Fam HX:    Family History  Problem Relation Age of Onset  . Stroke    . Fragile X syndrome Son     Patient is carrier  . Raynaud syndrome Mother   . Alzheimer's disease Father   . Diabetes Father   . Diabetes Sister   . Cancer Brother     brother  . Cancer Maternal Grandmother     cervical & breast   . Heart failure Maternal Grandfather   . Diabetes Paternal Grandmother    Social HX:    History   Social History  . Marital Status: Married    Spouse Name: N/A  . Number of Children: N/A  . Years of Education: N/A   Occupational History  . Not on file.   Social History Main Topics  . Smoking status: Never Smoker   . Smokeless tobacco: Never Used  . Alcohol Use: No  . Drug Use: No  . Sexual Activity:    Partners: Male   Other Topics Concern  . Not on file   Social History Narrative     ROS:  All 11 ROS were addressed and are negative except what is stated in the HPI  Physical Exam: Blood pressure 113/70, pulse 90, temperature 98.4 F (36.9 C), temperature source Oral, resp. rate 22, height 5\' 4"  (1.626 m), weight 225 lb (102.059 kg), SpO2 97 %.    General: Well developed, well nourished, in no acute distress Head: Eyes PERRLA, No xanthomas.   Normal cephalic and atramatic  Lungs:   Clear bilaterally to auscultation and percussion. Heart:   HRRR S1 S2 Pulses are 2+ & equal.            No carotid bruit. No JVD.  No abdominal bruits. No femoral bruits. Abdomen: Bowel sounds are positive, abdomen soft and non-tender  without masses Extremities:   No clubbing, cyanosis or edema.  DP +1 Neuro: Alert and oriented X 3. Psych:  Good affect, responds appropriately    Labs:   Lab Results  Component Value Date   WBC 12.7* 10/25/2014   HGB 13.9 10/25/2014   HCT 42.9 10/25/2014   MCV 84.3 10/25/2014  PLT 280 10/25/2014    Recent Labs Lab 10/24/14 1457  10/25/14 0328  NA  --   --  136  K  --   --  4.5  CL  --   --  102  CO2  --   --  24  BUN  --   --  13  CREATININE  --   < > 0.87  CALCIUM  --   --  9.0  PROT 8.1  --   --   BILITOT 0.6  --   --   ALKPHOS 116  --   --   ALT 29  --   --   AST 28  --   --   GLUCOSE  --   --  137*  < > = values in this interval not displayed. No results found for: PTT No results found for: INR, PROTIME Lab Results  Component Value Date   TROPONINI <0.03 10/25/2014     Lab Results  Component Value Date   CHOL 152 08/27/2013   Lab Results  Component Value Date   HDL 39* 08/27/2013   Lab Results  Component Value Date   LDLCALC 89 08/27/2013   Lab Results  Component Value Date   TRIG 118 08/27/2013   Lab Results  Component Value Date   CHOLHDL 3.9 08/27/2013   No results found for: LDLDIRECT    Radiology:  Ct Abdomen Pelvis Wo Contrast  10/24/2014   CLINICAL DATA:  Back pain, history multiple sclerosis, diabetes, pharyngeal axis syndrome, hypertension  EXAM: CT CHEST, ABDOMEN AND PELVIS WITHOUT CONTRAST  TECHNIQUE: Multidetector CT imaging of the chest, abdomen and pelvis was performed following the standard protocol without IV contrast. Sagittal and coronal MPR images reconstructed from axial data set. Examination was ordered with IV contrast material. The contrast dose extravasated at the RIGHT antecubital fossa. Patient was examined by Dr. Leonia Reeves. Routine post extravasation orders given. No oral contrast was administered.  COMPARISON:  CT chest 12/01/2013, 09/21/2012  FINDINGS: CT CHEST FINDINGS  Aorta normal caliber.  No significant  atherosclerotic calcification or thoracic adenopathy.  4 mm LEFT lung nodule image 23 at major fissure unchanged since 2014.  Remaining lungs clear.  No infiltrate, pleural effusion or pneumothorax.  Osseous structures unremarkable.  CT ABDOMEN AND PELVIS FINDINGS  Liver, gallbladder, spleen, pancreas, and adrenal glands normal.  Small amount of excreted contrast material within renal collecting systems.  Probable cyst upper pole RIGHT kidney 14 x 12 x 11 mm.  Normal appendix.  Intra-abdominal lipomatosis.  Stomach and bowel loops unremarkable for technique.  Normal appearing bladder and ureters.  Uterus surgically absent with normal sized ovaries.  No mass, adenopathy, free fluid or free air.  Tiny umbilical hernia containing fat.  Degenerative disc disease changes L1-L2.  IMPRESSION: No acute intra thoracic, intra-abdominal or intrapelvic abnormalities.  Probable cyst upper pole RIGHT kidney.  Nonspecific 4 mm LEFT lung nodule unchanged since 2014; no further workup required.   Electronically Signed   By: Lavonia Dana M.D.   On: 10/24/2014 19:30   Dg Chest 2 View  10/24/2014   CLINICAL DATA:  Chest pain and shortness of Breath following a fall at work this morning  EXAM: CHEST  2 VIEW  COMPARISON:  08/27/2013, 12/01/2013  FINDINGS: Cardiac shadow is within normal limits. The lungs are well aerated bilaterally. No focal infiltrate or sizable effusion is seen. No acute bony abnormality is noted.  IMPRESSION: No acute abnormality seen.   Electronically Signed  By: Inez Catalina M.D.   On: 10/24/2014 14:33   Ct Chest Wo Contrast  10/24/2014   CLINICAL DATA:  Back pain, history multiple sclerosis, diabetes, pharyngeal axis syndrome, hypertension  EXAM: CT CHEST, ABDOMEN AND PELVIS WITHOUT CONTRAST  TECHNIQUE: Multidetector CT imaging of the chest, abdomen and pelvis was performed following the standard protocol without IV contrast. Sagittal and coronal MPR images reconstructed from axial data set. Examination was  ordered with IV contrast material. The contrast dose extravasated at the RIGHT antecubital fossa. Patient was examined by Dr. Leonia Reeves. Routine post extravasation orders given. No oral contrast was administered.  COMPARISON:  CT chest 12/01/2013, 09/21/2012  FINDINGS: CT CHEST FINDINGS  Aorta normal caliber.  No significant atherosclerotic calcification or thoracic adenopathy.  4 mm LEFT lung nodule image 23 at major fissure unchanged since 2014.  Remaining lungs clear.  No infiltrate, pleural effusion or pneumothorax.  Osseous structures unremarkable.  CT ABDOMEN AND PELVIS FINDINGS  Liver, gallbladder, spleen, pancreas, and adrenal glands normal.  Small amount of excreted contrast material within renal collecting systems.  Probable cyst upper pole RIGHT kidney 14 x 12 x 11 mm.  Normal appendix.  Intra-abdominal lipomatosis.  Stomach and bowel loops unremarkable for technique.  Normal appearing bladder and ureters.  Uterus surgically absent with normal sized ovaries.  No mass, adenopathy, free fluid or free air.  Tiny umbilical hernia containing fat.  Degenerative disc disease changes L1-L2.  IMPRESSION: No acute intra thoracic, intra-abdominal or intrapelvic abnormalities.  Probable cyst upper pole RIGHT kidney.  Nonspecific 4 mm LEFT lung nodule unchanged since 2014; no further workup required.   Electronically Signed   By: Lavonia Dana M.D.   On: 10/24/2014 19:30    EKG:  Sinus tachycardia with no ST changes  ASSESSMENT/PLAN: 1.  Atypical chest pain that originated in the back and radiated into the neck and chest immediately after fastening her seat belt.  This sounds musculoskeletal in etiology.  Her EKG shows no ischemic ST changes.  Troponin is negative x 3.  Her CRF include obesity, family history of CAD in her mom in her 103's, DM and HTN. D-Dimer was negative. I think she is stable for discharge if D-Dimer is normal and will set up a nuclear stress test as an outpatient given her CRFs.  She has MS but  would like to try to walk in the treadmill. 2.  HTN - controlled 3. Type II DM  Sueanne Margarita, MD  10/25/2014  11:52 AM

## 2014-10-25 NOTE — Discharge Summary (Signed)
PATIENT DETAILS Name: Brandi Ramsey Age: 64 y.o. Sex: female Date of Birth: 05/08/1951 MRN: [id number]. Admitting Physician: Rise Patience, MD TIW:[id number], Phylliss Bob, MD  Admit Date: 10/24/2014 Discharge date: 10/25/2014  Recommendations for Outpatient Follow-up:  1. Please ensure follow-up with cardiology for outpatient stress test. 2. Please note that her CT chest did show a Nonspecific 4 mm LEFT lung nodule that appears to be unchanged since 2014; per official report of CT chest no further workup required.  PRIMARY DISCHARGE DIAGNOSIS:  Principal Problem:   Chest pain Active Problems:   Convulsions, epileptic   Fragile X associated tremor ataxia syndrome   Diarrhea   Tachycardia      PAST MEDICAL HISTORY: Past Medical History  Diagnosis Date  . Seizures   . Diabetes mellitus without complication   . Fragile X syndrome     ataxia syndrome.    [name] Hypertension   . Hemorrhoids   . Transient cerebral ischemia   . Vertigo, benign paroxysmal   . MS (multiple sclerosis)   . Cerebellar ataxia   . Stroke     DISCHARGE MEDICATIONS: Current Discharge Medication List    START taking these medications   Details  aspirin EC 81 MG EC tablet Take 1 tablet (81 mg total) by mouth daily.      CONTINUE these medications which have NOT CHANGED   Details  atorvastatin (LIPITOR) 80 MG tablet Take 80 mg by mouth at bedtime.     BREO ELLIPTA 100-25 MCG/INH AEPB Inhale 1 puff into the lungs daily.  Refills: 11    Calcium-Vitamin D 600-200 MG-UNIT per tablet Take 1 tablet by mouth 2 (two) times daily.    cetirizine (ZYRTEC) 10 MG tablet Take 10 mg by mouth daily. Refills: 5    clonazePAM (KLONOPIN) 1 MG tablet Take 1 mg by mouth 3 (three) times daily as needed. For anxiety    [facility] CALCIUM 600+D 600-800 MG-UNIT TABS Take 1 tablet by mouth 2 (two) times daily with a meal. Refills: 3    DULoxetine (CYMBALTA) 30 MG capsule Take 30 mg by mouth daily.    loratadine (CLARITIN) 10  MG tablet Take 10 mg by mouth every morning.    meclizine (ANTIVERT) 25 MG tablet Take 25 mg by mouth every 6 (six) hours as needed for dizziness.    metoprolol succinate (TOPROL-XL) 25 MG 24 hr tablet Take 25 mg by mouth at bedtime.    pantoprazole (PROTONIX) 40 MG tablet Take 40 mg by mouth 2 (two) times daily.  Refills: 3    PHENobarbital (LUMINAL) 97.2 MG tablet Take 97.2 mg by mouth at bedtime.    ranitidine (ZANTAC) 150 MG tablet Take 150 mg by mouth 2 (two) times daily as needed (for indigestion).     tetrahydrozoline (VISINE) 0.05 % ophthalmic solution Place 2 drops into both eyes 5 (five) times daily as needed (for dry eyes).    Vitamin D, Ergocalciferol, (DRISDOL) 50000 UNITS CAPS capsule Take 50,000 Units by mouth every Monday.    citalopram (CELEXA) 20 MG tablet Take 1 tablet (20 mg total) by mouth daily. Qty: 30 tablet, Refills: 3   Associated Diagnoses: Depression        ALLERGIES:   Allergies  Allergen Reactions  . Buprenorphine Hcl Hives and Itching  . Meperidine Hives and Itching    Other reaction(s): HIVES,ITCHING  . Morphine And Related Hives and Itching  . Pentazocine Anaphylaxis  . Pentazocine Lactate Anaphylaxis  . Phenytoin Hives  Other reaction(s): RASH  . Demerol Hives and Itching  . Dilaudid [Hydromorphone Hcl]     Hallucinations and felt crazy   . Doxycycline Hyclate     Other reaction(s): GI UPSET,NAUSEA,VOMITING  . Hydromorphone     Altered mental status  . Ibuprofen     Contraindicated for risk of GI bleeding.  [name] Phenytoin Sodium Extended Hives  . Codeine Rash    When mixed with Demerol Patient reports no reaction with Codeine/APAP (Tylenol #3)    BRIEF HPI:  See H&P, Labs, Consult and Test reports for all details in brief, patient is a 64 year old female with history of seizures, dyslipidemia, facial X syndrome with ataxia who presented to the ED for evaluation of chest pain.   CONSULTATIONS:   cardiology  PERTINENT RADIOLOGIC  STUDIES: Ct Abdomen Pelvis Wo Contrast  10/24/2014   CLINICAL DATA:  Back pain, history multiple sclerosis, diabetes, pharyngeal axis syndrome, hypertension  EXAM: CT CHEST, ABDOMEN AND PELVIS WITHOUT CONTRAST  TECHNIQUE: Multidetector CT imaging of the chest, abdomen and pelvis was performed following the standard protocol without IV contrast. Sagittal and coronal MPR images reconstructed from axial data set. Examination was ordered with IV contrast material. The contrast dose extravasated at the RIGHT antecubital fossa. Patient was examined by Dr. Leonia Reeves. Routine post extravasation orders given. No oral contrast was administered.  COMPARISON:  CT chest 12/01/2013, 09/21/2012  FINDINGS: CT CHEST FINDINGS  Aorta normal caliber.  No significant atherosclerotic calcification or thoracic adenopathy.  4 mm LEFT lung nodule image 23 at major fissure unchanged since 2014.  Remaining lungs clear.  No infiltrate, pleural effusion or pneumothorax.  Osseous structures unremarkable.  CT ABDOMEN AND PELVIS FINDINGS  Liver, gallbladder, spleen, pancreas, and adrenal glands normal.  Small amount of excreted contrast material within renal collecting systems.  Probable cyst upper pole RIGHT kidney 14 x 12 x 11 mm.  Normal appendix.  Intra-abdominal lipomatosis.  Stomach and bowel loops unremarkable for technique.  Normal appearing bladder and ureters.  Uterus surgically absent with normal sized ovaries.  No mass, adenopathy, free fluid or free air.  Tiny umbilical hernia containing fat.  Degenerative disc disease changes L1-L2.  IMPRESSION: No acute intra thoracic, intra-abdominal or intrapelvic abnormalities.  Probable cyst upper pole RIGHT kidney.  Nonspecific 4 mm LEFT lung nodule unchanged since 2014; no further workup required.   Electronically Signed   By: Lavonia Dana M.D.   On: 10/24/2014 19:30   Dg Chest 2 View  10/24/2014   CLINICAL DATA:  Chest pain and shortness of Breath following a fall at work this morning  EXAM:  CHEST  2 VIEW  COMPARISON:  08/27/2013, 12/01/2013  FINDINGS: Cardiac shadow is within normal limits. The lungs are well aerated bilaterally. No focal infiltrate or sizable effusion is seen. No acute bony abnormality is noted.  IMPRESSION: No acute abnormality seen.   Electronically Signed   By: Inez Catalina M.D.   On: 10/24/2014 14:33   Ct Chest Wo Contrast  10/24/2014   CLINICAL DATA:  Back pain, history multiple sclerosis, diabetes, pharyngeal axis syndrome, hypertension  EXAM: CT CHEST, ABDOMEN AND PELVIS WITHOUT CONTRAST  TECHNIQUE: Multidetector CT imaging of the chest, abdomen and pelvis was performed following the standard protocol without IV contrast. Sagittal and coronal MPR images reconstructed from axial data set. Examination was ordered with IV contrast material. The contrast dose extravasated at the RIGHT antecubital fossa. Patient was examined by Dr. Leonia Reeves. Routine post extravasation orders given. No oral contrast was administered.  COMPARISON:  CT chest 12/01/2013, 09/21/2012  FINDINGS: CT CHEST FINDINGS  Aorta normal caliber.  No significant atherosclerotic calcification or thoracic adenopathy.  4 mm LEFT lung nodule image 23 at major fissure unchanged since 2014.  Remaining lungs clear.  No infiltrate, pleural effusion or pneumothorax.  Osseous structures unremarkable.  CT ABDOMEN AND PELVIS FINDINGS  Liver, gallbladder, spleen, pancreas, and adrenal glands normal.  Small amount of excreted contrast material within renal collecting systems.  Probable cyst upper pole RIGHT kidney 14 x 12 x 11 mm.  Normal appendix.  Intra-abdominal lipomatosis.  Stomach and bowel loops unremarkable for technique.  Normal appearing bladder and ureters.  Uterus surgically absent with normal sized ovaries.  No mass, adenopathy, free fluid or free air.  Tiny umbilical hernia containing fat.  Degenerative disc disease changes L1-L2.  IMPRESSION: No acute intra thoracic, intra-abdominal or intrapelvic abnormalities.   Probable cyst upper pole RIGHT kidney.  Nonspecific 4 mm LEFT lung nodule unchanged since 2014; no further workup required.   Electronically Signed   By: Lavonia Dana M.D.   On: 10/24/2014 19:30     PERTINENT LAB RESULTS: CBC:  Recent Labs  10/24/14 2224 10/25/14 0328  WBC 14.4* 12.7*  HGB 14.0 13.9  HCT 42.5 42.9  PLT 262 280   CMET CMP     Component Value Date/Time   NA 136 10/25/2014 0328   K 4.5 10/25/2014 0328   CL 102 10/25/2014 0328   CO2 24 10/25/2014 0328   GLUCOSE 137* 10/25/2014 0328   BUN 13 10/25/2014 0328   CREATININE 0.87 10/25/2014 0328   CALCIUM 9.0 10/25/2014 0328   PROT 8.1 10/24/2014 1457   ALBUMIN 3.8 10/24/2014 1457   AST 28 10/24/2014 1457   ALT 29 10/24/2014 1457   ALKPHOS 116 10/24/2014 1457   BILITOT 0.6 10/24/2014 1457   GFRNONAA >60 10/25/2014 0328   GFRAA >60 10/25/2014 0328    GFR Estimated Creatinine Clearance: 78 mL/min (by C-G formula based on Cr of 0.87).  Recent Labs  10/24/14 1457  LIPASE 33    Recent Labs  10/24/14 2224 10/25/14 0328 10/25/14 1005  TROPONINI <0.03 <0.03 <0.03   Invalid input(s): POCBNP  Recent Labs  10/24/14 1859  DDIMER 0.40   No results for input(s): HGBA1C in the last 72 hours. No results for input(s): CHOL, HDL, LDLCALC, TRIG, CHOLHDL, LDLDIRECT in the last 72 hours.  Recent Labs  10/24/14 2224  TSH 1.219   No results for input(s): VITAMINB12, FOLATE, FERRITIN, TIBC, IRON, RETICCTPCT in the last 72 hours. Coags: No results for input(s): INR in the last 72 hours.  Invalid input(s): PT Microbiology: Recent Results (from the past 240 hour(s))  Clostridium Difficile by PCR     Status: None   Collection Time: 10/25/14 11:14 AM  Result Value Ref Range Status   C difficile by pcr NEGATIVE NEGATIVE Final     BRIEF HOSPITAL COURSE:   Principal Problem:   Chest pain: Patient presented with retrosternal chest pain and back pain. She underwent a CT scan of the abdomen and pelvis along  with a CT scan of the chest that was negative for acute abnormalities. She was admitted and cardiac enzymes were cycled which were negative. She was evaluated by Dr. Radford Pax from neurology, who felt that her symptoms were suggestive of musculoskeletal etiology and recommended outpatient stress test. She had no further recommendation whether patient was hospitalized. Patient is currently chest pain-free, and is being discharged in a stable manner.  Please  note that her CT chest did show a Nonspecific 4 mm LEFT lung nodule that appears to be unchanged since 2014; per official report of CT chest no further workup required.    Rest of her medical issues were stable during this short hospitalization    TODAY-DAY OF DISCHARGE:  Subjective:   Ernestina Patches today has no headache,no chest abdominal pain,no new weakness tingling or numbness, feels much better wants to go home today.   Objective:   Blood pressure 113/70, pulse 90, temperature 98.4 F (36.9 C), temperature source Oral, resp. rate 22, height 5\' 4"  (1.626 m), weight 102.059 kg (225 lb), SpO2 97 %.  Intake/Output Summary (Last 24 hours) at 10/25/14 1257 Last data filed at 10/25/14 0700  Gross per 24 hour  Intake 386.67 ml  Output      0 ml  Net 386.67 ml   Filed Weights   10/24/14 2206 10/25/14 0500  Weight: 102.5 kg (225 lb 15.5 oz) 102.059 kg (225 lb)    Exam Awake Alert, Oriented *3, No new F.N deficits, Normal affect [place].AT,PERRAL Supple Neck,No JVD, No cervical lymphadenopathy appriciated.  Symmetrical Chest wall movement, Good air movement bilaterally, CTAB RRR,No Gallops,Rubs or new Murmurs, No Parasternal Heave +ve B.Sounds, Abd Soft, Non tender, No organomegaly appriciated, No rebound -guarding or rigidity. No Cyanosis, Clubbing or edema, No new Rash or bruise  DISCHARGE CONDITION: Stable  DISPOSITION: Home  DISCHARGE INSTRUCTIONS:    Activity:  As tolerated   Diet recommendation: Heart Healthy  diet  Discharge Instructions    Call MD for:  difficulty breathing, headache or visual disturbances    Complete by:  As directed      Call MD for:  redness, tenderness, or signs of infection (pain, swelling, redness, odor or green/yellow discharge around incision site)    Complete by:  As directed      Diet - low sodium heart healthy    Complete by:  As directed      Increase activity slowly    Complete by:  As directed            Follow-up Information    Follow up with HICKS, KRISTIN D, MD. Schedule an appointment as soon as possible for a visit in 1 week.   Specialty:  Internal Medicine      Follow up with [place].   Why:  office will call you with a appt for a stress test. Please call the office if you have not heard from them in the next few days (you were seen by Dr Turner-cardiologist in the hospital)   Contact information:   [place] [id number] [phone]      Total Time spent on discharge equals 25   Signed: [facility] 10/25/2014 12:57 PM

## 2014-10-25 NOTE — Telephone Encounter (Signed)
Pt called she verbalized disappointment with the lack of diagnosis during her NP appt with Dr. Dwyane Dee on 10/24/14. She states,"he was rude and condescending", Brandi Ramsey asked for reasoning she began to state that she felt as if he blew her concerns off as it not important. She asked for Dr. Dwyane Dee to do a full panel of labs regarding her thyroid and was upset he only drew for TSH and T4. She was upset with the statement she states he made regarding her husband and the pt "shouldn't be reading on the internet." Pt is requesting to be switched to see Dr. Cruzita Lederer and to see if I could ask for the more extensive thyroid levels to be drawn. Brandi Ramsey informed pt that once I received the answers to these questions I would call her.

## 2014-10-25 NOTE — Progress Notes (Signed)
UR completed 

## 2014-10-25 NOTE — Telephone Encounter (Signed)
Please set patient up for outpt Stress myoview for chest pain - order has been placed

## 2014-10-26 ENCOUNTER — Encounter: Payer: Self-pay | Admitting: *Deleted

## 2014-10-26 LAB — T3, FREE: T3, Free: 3.7 pg/mL (ref 2.0–4.4)

## 2014-10-27 ENCOUNTER — Telehealth: Payer: Self-pay | Admitting: Neurology

## 2014-10-27 NOTE — Telephone Encounter (Signed)
Wants recommendation for internist within [place]. Cb# [phone] / Sherri S.

## 2014-10-30 NOTE — Telephone Encounter (Signed)
I left message with patient to return my call regarding  PCP referral

## 2014-10-31 ENCOUNTER — Ambulatory Visit
Admission: RE | Admit: 2014-10-31 | Discharge: 2014-10-31 | Disposition: A | Discharge status: Home / Self Care | Payer: PPO | Source: Ambulatory Visit | Attending: Neurology | Admitting: Neurology

## 2014-11-02 ENCOUNTER — Telehealth: Payer: Self-pay | Admitting: *Deleted

## 2014-11-02 NOTE — Telephone Encounter (Signed)
Patient is aware of normal DEXA scan

## 2014-11-02 NOTE — Telephone Encounter (Signed)
-----   Message from Pieter Partridge, DO sent at 10/31/2014  2:30 PM EDT ----- DEXA scan is normal. ----- Message -----    From: Rad Results In Interface    Sent: 10/31/2014   1:08 PM      To: Pieter Partridge, DO

## 2014-11-07 ENCOUNTER — Telehealth (HOSPITAL_COMMUNITY): Payer: Self-pay

## 2014-11-07 NOTE — Telephone Encounter (Signed)
Patient given detailed instructions per Myocardial Perfusion Study Information Sheet for test on 11-08-2014 at 11:30am. Patient verbalized understanding. Brandi Ramsey, [name] A

## 2014-11-08 ENCOUNTER — Ambulatory Visit (HOSPITAL_COMMUNITY): Payer: PPO | Attending: Cardiology

## 2014-11-08 DIAGNOSIS — R079 Chest pain, unspecified: Secondary | ICD-10-CM | POA: Diagnosis not present

## 2014-11-08 DIAGNOSIS — I10 Essential (primary) hypertension: Secondary | ICD-10-CM | POA: Diagnosis not present

## 2014-11-08 DIAGNOSIS — R0609 Other forms of dyspnea: Secondary | ICD-10-CM | POA: Insufficient documentation

## 2014-11-08 DIAGNOSIS — E119 Type 2 diabetes mellitus without complications: Secondary | ICD-10-CM | POA: Insufficient documentation

## 2014-11-08 MED ORDER — TECHNETIUM TC 99M SESTAMIBI GENERIC - CARDIOLITE
33.0000 | Freq: Once | INTRAVENOUS | Status: AC | PRN
  Administered 2014-11-08: 33 via INTRAVENOUS

## 2014-11-09 ENCOUNTER — Ambulatory Visit (HOSPITAL_COMMUNITY): Payer: PPO | Attending: Cardiology

## 2014-11-09 LAB — MYOCARDIAL PERFUSION IMAGING
CHL CUP NUCLEAR SSS: 3
CHL CUP STRESS STAGE 1 SBP: 145 mmHg
CHL CUP STRESS STAGE 2 DBP: 83 mmHg
CHL CUP STRESS STAGE 2 GRADE: 0 %
CHL CUP STRESS STAGE 2 HR: 96 {beats}/min
CHL CUP STRESS STAGE 3 HR: 96 {beats}/min
CHL CUP STRESS STAGE 4 DBP: 94 mmHg
CHL CUP STRESS STAGE 4 GRADE: 10 %
CHL CUP STRESS STAGE 4 SPEED: 1.7 mph
CHL CUP STRESS STAGE 5 DBP: 98 mmHg
CHL CUP STRESS STAGE 5 GRADE: 12 %
CHL CUP STRESS STAGE 5 SBP: 166 mmHg
CHL CUP STRESS STAGE 5 SPEED: 2.5 mph
CHL CUP STRESS STAGE 6 HR: 139 {beats}/min
CHL CUP STRESS STAGE 7 SPEED: 0 mph
CHL CUP STRESS STAGE 8 GRADE: 0 %
CHL CUP STRESS STAGE 8 HR: 100 {beats}/min
CHL CUP STRESS STAGE 8 SBP: 162 mmHg
CHL CUP STRESS STAGE 8 SPEED: 0 mph
CSEPPHR: 139 {beats}/min
Estimated workload: 7 METS
Exercise duration (min): 6 min
Exercise duration (sec): 0 s
LV dias vol: 48 mL
LV sys vol: 10 mL
MPHR: 158 {beats}/min
NUC STRESS TID: 0.87
Nuc Stress EF: 78 %
Percent HR: 89 %
Percent of predicted max HR: 87 %
RATE: 0.31
Rest HR: 91 {beats}/min
SDS: 3
SRS: 0
Stage 1 DBP: 85 mmHg
Stage 1 Grade: 0 %
Stage 1 HR: 100 {beats}/min
Stage 1 Speed: 0 mph
Stage 2 SBP: 130 mmHg
Stage 2 Speed: 0 mph
Stage 3 Grade: 0 %
Stage 3 Speed: 0 mph
Stage 4 HR: 117 {beats}/min
Stage 4 SBP: 148 mmHg
Stage 5 HR: 139 {beats}/min
Stage 6 Grade: 12 %
Stage 6 Speed: 2.5 mph
Stage 7 DBP: 81 mmHg
Stage 7 Grade: 0 %
Stage 7 HR: 133 {beats}/min
Stage 7 SBP: 173 mmHg
Stage 8 DBP: 85 mmHg

## 2014-11-09 MED ORDER — TECHNETIUM TC 99M SESTAMIBI GENERIC - CARDIOLITE
33.0000 | Freq: Once | INTRAVENOUS | Status: AC | PRN
  Administered 2014-11-09: 33 via INTRAVENOUS

## 2014-11-13 ENCOUNTER — Telehealth: Payer: Self-pay | Admitting: Neurology

## 2014-11-13 NOTE — Telephone Encounter (Signed)
Patient is asking if the Celexa 10 mg can be causing her tachycardi the pharmacy was question this  Please advise

## 2014-11-13 NOTE — Telephone Encounter (Signed)
She will stop for now and see if she can tell a difference she will also keep a diary

## 2014-11-13 NOTE — Telephone Encounter (Signed)
If she began having tachycardia only after starting citalopram, then it is possible.  If that is the case, then I recommend stopping it and seeing if the tachycardia resolves.  She should discuss this with her PCP as well.

## 2014-11-13 NOTE — Telephone Encounter (Signed)
Pt needs to talk to someone about mediation and some heart issues that are going on with her please call [phone]

## 2014-11-15 ENCOUNTER — Ambulatory Visit: Payer: PPO | Admitting: Endocrinology

## 2014-11-17 ENCOUNTER — Telehealth: Payer: Self-pay | Admitting: Cardiology

## 2014-11-17 NOTE — Telephone Encounter (Signed)
Informed patient that her normal results were released to MyChart, and apologized she did not receive an alert. Patient grateful for callback.

## 2014-11-17 NOTE — Telephone Encounter (Signed)
Follow Up   Pt is calling following up on stress test results. Please call.

## 2014-12-14 ENCOUNTER — Ambulatory Visit (INDEPENDENT_AMBULATORY_CARE_PROVIDER_SITE_OTHER): Payer: PPO | Admitting: Internal Medicine

## 2014-12-14 ENCOUNTER — Encounter: Payer: Self-pay | Admitting: Internal Medicine

## 2014-12-14 ENCOUNTER — Other Ambulatory Visit (INDEPENDENT_AMBULATORY_CARE_PROVIDER_SITE_OTHER): Payer: PPO

## 2014-12-14 VITALS — BP 142/98 | HR 108 | Temp 98.4°F | Resp 18 | Ht 64.0 in | Wt 230.0 lb

## 2014-12-14 DIAGNOSIS — R7301 Impaired fasting glucose: Secondary | ICD-10-CM

## 2014-12-14 DIAGNOSIS — W19XXXA Unspecified fall, initial encounter: Secondary | ICD-10-CM | POA: Diagnosis not present

## 2014-12-14 DIAGNOSIS — E669 Obesity, unspecified: Secondary | ICD-10-CM | POA: Diagnosis not present

## 2014-12-14 DIAGNOSIS — F329 Major depressive disorder, single episode, unspecified: Secondary | ICD-10-CM

## 2014-12-14 DIAGNOSIS — E049 Nontoxic goiter, unspecified: Secondary | ICD-10-CM | POA: Diagnosis not present

## 2014-12-14 DIAGNOSIS — R1310 Dysphagia, unspecified: Secondary | ICD-10-CM

## 2014-12-14 DIAGNOSIS — R251 Tremor, unspecified: Secondary | ICD-10-CM

## 2014-12-14 DIAGNOSIS — E119 Type 2 diabetes mellitus without complications: Secondary | ICD-10-CM

## 2014-12-14 DIAGNOSIS — F32A Depression, unspecified: Secondary | ICD-10-CM

## 2014-12-14 LAB — COMPREHENSIVE METABOLIC PANEL
ALT: 17 U/L (ref 0–35)
AST: 17 U/L (ref 0–37)
Albumin: 3.8 g/dL (ref 3.5–5.2)
Alkaline Phosphatase: 111 U/L (ref 39–117)
BUN: 11 mg/dL (ref 6–23)
CALCIUM: 9.5 mg/dL (ref 8.4–10.5)
CHLORIDE: 105 meq/L (ref 96–112)
CO2: 28 meq/L (ref 19–32)
CREATININE: 0.91 mg/dL (ref 0.40–1.20)
GFR: 66.38 mL/min (ref >60.00)
Glucose, Bld: 116 mg/dL — ABNORMAL HIGH (ref 70–99)
POTASSIUM: 3.9 meq/L (ref 3.5–5.1)
SODIUM: 138 meq/L (ref 135–145)
TOTAL PROTEIN: 7.4 g/dL (ref 6.0–8.3)
Total Bilirubin: 0.3 mg/dL (ref 0.2–1.2)

## 2014-12-14 LAB — T4, FREE: Free T4: 0.72 ng/dL (ref 0.60–1.60)

## 2014-12-14 LAB — TSH: TSH: 0.83 u[IU]/mL (ref 0.35–4.50)

## 2014-12-14 LAB — HEMOGLOBIN A1C: Hgb A1c MFr Bld: 6.9 % — ABNORMAL HIGH (ref 4.6–6.5)

## 2014-12-14 NOTE — Patient Instructions (Signed)
We are going to check some blood work today as well as getting the repeat ultrasound of the neck to check on the goiter. We will call you back with the results.   We will get your records and review them.

## 2014-12-14 NOTE — Progress Notes (Signed)
Pre visit review using our clinic review tool, if applicable. No additional management support is needed unless otherwise documented below in the visit note. 

## 2014-12-15 NOTE — Assessment & Plan Note (Signed)
She is on cymbalta 30 mg daily at this time and not ideal dosing. She did not want to change at this time but would like to increase to 60 mg daily. This is a good agent for her as she likely would benefit from the neural blocking as well.

## 2014-12-15 NOTE — Assessment & Plan Note (Signed)
Thought to be related to fragile x tremor ataxia syndrome and following with neurology. Unclear to me whether this is a progressive or stable process and will look up additional information to help guide prognosis and treatment.

## 2014-12-15 NOTE — Assessment & Plan Note (Signed)
Checking labs today for complications. This is likely affecting her quality of life and ability to perform ADLs and exercise. Talked to her about water aerobics as an option for less stress to the joints while still maintaining aerobic exercise.

## 2014-12-15 NOTE — Assessment & Plan Note (Signed)
Falls quite often due to her fragile x tremors. Not ready to use walker all the time but likely she should. She did not want to at this time.

## 2014-12-15 NOTE — Assessment & Plan Note (Signed)
Currently not on treatment and will check labs and add agent if needed. Due to acute concerns not able to address management further today and will discuss at follow up.

## 2014-12-15 NOTE — Assessment & Plan Note (Signed)
Thought to be related to her tremor in the voice box after visualization. Unclear if her goiter is contributing to this and will recheck US soft tissue neck.

## 2014-12-15 NOTE — Progress Notes (Signed)
   Subjective:    Patient ID: Brandi Ramsey, female    DOB: 1950-10-28, 64 y.o.   MRN: [id number]  HPI The patient is a 64 YO female new with complicated PMH (see A/P) who is coming in for fatigue for some time. She has goiter and some trouble swallowing. She is known to have tremors of her voice box which is thought to cause some of her swallowing problems but she is concerned about a goiter which is on her neck. She saw endocrine recently and felt that they did not treat her well or check her thyroid. She has been gaining weight over the last 3 years and attributes some of that to less activity due to chronic pain and medical problems however she feels that it is excessive. She does not feel that she eats much and still cannot go down in weight.   [facility], [facility], social history reviewed and updated.   Review of Systems  Constitutional: Positive for activity change, fatigue and unexpected weight change. Negative for fever, chills and appetite change.  HENT: Positive for trouble swallowing. Negative for congestion and dental problem.   Eyes: Negative.   Respiratory: Negative for cough, chest tightness, shortness of breath and wheezing.   Cardiovascular: Negative for chest pain, palpitations and leg swelling.  Gastrointestinal: Negative for abdominal pain, diarrhea, constipation, blood in stool and abdominal distention.  Musculoskeletal: Positive for myalgias, arthralgias and gait problem.  Skin: Negative.   Neurological: Positive for tremors, weakness and light-headedness. Negative for dizziness, speech difficulty, numbness and headaches.  Psychiatric/Behavioral: Positive for dysphoric mood and decreased concentration. Negative for sleep disturbance. The patient is not nervous/anxious.       Objective:   Physical Exam  Constitutional: She is oriented to person, place, and time. She appears well-developed and well-nourished.  Overweight  HENT:  Head: Normocephalic and atraumatic.  Oropharynx  without redness or swelling  Eyes: EOM are normal.  Neck: Normal range of motion. No JVD present. Thyromegaly present.  No clear cysts in the thyroid felt, adiposity in the neck as well  Cardiovascular: Normal rate and regular rhythm.   Pulmonary/Chest: Effort normal. No respiratory distress. She has no wheezes. She has no rales.  Abdominal: Soft. She exhibits no distension. There is no tenderness. There is no rebound.  Musculoskeletal: She exhibits no edema.  Lymphadenopathy:    She has no cervical adenopathy.  Neurological: She is alert and oriented to person, place, and time.  Skin: Skin is warm and dry.  Psychiatric:  Very distracted and talking more about her Ramsey with fragile x than herself and requires constant redirection to keep focus   Filed Vitals:   12/14/14 1524  BP: 142/98  Pulse: 108  Temp: 98.4 F (36.9 C)  TempSrc: Oral  Resp: 18  Height: 5\' 4"  (1.626 m)  Weight: 230 lb (104.327 kg)  SpO2: 96%      Assessment & Plan:

## 2014-12-20 ENCOUNTER — Ambulatory Visit
Admission: RE | Admit: 2014-12-20 | Discharge: 2014-12-20 | Disposition: A | Discharge status: Home / Self Care | Payer: PPO | Source: Ambulatory Visit | Attending: Internal Medicine | Admitting: Internal Medicine

## 2014-12-20 DIAGNOSIS — E049 Nontoxic goiter, unspecified: Secondary | ICD-10-CM

## 2015-01-04 ENCOUNTER — Telehealth: Payer: Self-pay | Admitting: Internal Medicine

## 2015-01-04 NOTE — Telephone Encounter (Signed)
Pt called in and needs refill on her clonazePAM (KLONOPIN) 1 MG tablet [[id number]]

## 2015-01-04 NOTE — Telephone Encounter (Signed)
I don't think that we are prescribing this for her. She should call her mental health provider. If she does not have a mental health advisor would be okay with one month supply but not comfortable rxing long term.

## 2015-01-04 NOTE — Telephone Encounter (Signed)
Patient wants to come in and talk about this. She is scheduling an appointment.

## 2015-01-08 ENCOUNTER — Other Ambulatory Visit: Payer: Self-pay | Admitting: Internal Medicine

## 2015-01-10 ENCOUNTER — Telehealth: Payer: Self-pay | Admitting: Internal Medicine

## 2015-01-10 ENCOUNTER — Other Ambulatory Visit: Payer: Self-pay | Admitting: Geriatric Medicine

## 2015-01-10 MED ORDER — DULOXETINE HCL 30 MG PO CPEP: 30.0000 mg | ORAL_CAPSULE | Freq: Every day | ORAL | Status: DC

## 2015-01-10 NOTE — Telephone Encounter (Signed)
Pt called in and is wanting a refill on her DULoxetine (CYMBALTA) 30 MG capsule [[id number]  [facility] [phone]   Pt best number [phone]

## 2015-01-10 NOTE — Telephone Encounter (Signed)
Pt also thought that dr Doug Sou was going to send in 30 day supply of the Klonopin?  Do you know is she was going to do that?

## 2015-01-10 NOTE — Telephone Encounter (Signed)
Sent to pharmacy 

## 2015-01-11 MED ORDER — CLONAZEPAM 1 MG PO TABS: 1.0000 mg | ORAL_TABLET | Freq: Three times a day (TID) | ORAL | Status: DC | PRN

## 2015-01-11 NOTE — Addendum Note (Signed)
Addended by: Mauricio Po D on: 01/11/2015 01:06 PM   Modules accepted: Orders

## 2015-01-11 NOTE — Telephone Encounter (Signed)
Per Dr. Doug Sou, ok to fill one month supply. Would you please approve in Dr. Donneta Romberg absence?

## 2015-01-11 NOTE — Telephone Encounter (Signed)
1 month supply of medication refilled.

## 2015-01-12 ENCOUNTER — Other Ambulatory Visit: Payer: Self-pay | Admitting: Geriatric Medicine

## 2015-01-15 ENCOUNTER — Other Ambulatory Visit: Payer: Self-pay | Admitting: Geriatric Medicine

## 2015-01-15 ENCOUNTER — Telehealth: Payer: Self-pay | Admitting: Internal Medicine

## 2015-01-15 MED ORDER — PANTOPRAZOLE SODIUM 40 MG PO TBEC: 40.0000 mg | DELAYED_RELEASE_TABLET | Freq: Two times a day (BID) | ORAL | Status: DC

## 2015-01-15 NOTE — Telephone Encounter (Signed)
Patient is requesting pantoprazole 40 to be [facility] [place].  States this is for her stomach.  Patient states she is having surgery tomorrow and would like to pick up before then.

## 2015-01-15 NOTE — Telephone Encounter (Signed)
Sent to pharmacy 

## 2015-01-25 ENCOUNTER — Ambulatory Visit: Payer: PPO | Admitting: Internal Medicine

## 2015-01-29 ENCOUNTER — Emergency Department (HOSPITAL_COMMUNITY)
Admission: EM | Admit: 2015-01-29 | Discharge: 2015-01-29 | Disposition: A | Discharge status: Home / Self Care | Payer: PPO | Attending: Emergency Medicine | Admitting: Emergency Medicine

## 2015-01-29 ENCOUNTER — Emergency Department (HOSPITAL_COMMUNITY): Payer: PPO

## 2015-01-29 ENCOUNTER — Encounter (HOSPITAL_COMMUNITY): Payer: Self-pay

## 2015-01-29 DIAGNOSIS — Y998 Other external cause status: Secondary | ICD-10-CM | POA: Insufficient documentation

## 2015-01-29 DIAGNOSIS — S0093XA Contusion of unspecified part of head, initial encounter: Secondary | ICD-10-CM

## 2015-01-29 DIAGNOSIS — I10 Essential (primary) hypertension: Secondary | ICD-10-CM | POA: Insufficient documentation

## 2015-01-29 DIAGNOSIS — Y9289 Other specified places as the place of occurrence of the external cause: Secondary | ICD-10-CM | POA: Diagnosis not present

## 2015-01-29 DIAGNOSIS — W01198A Fall on same level from slipping, tripping and stumbling with subsequent striking against other object, initial encounter: Secondary | ICD-10-CM | POA: Diagnosis not present

## 2015-01-29 DIAGNOSIS — Y9389 Activity, other specified: Secondary | ICD-10-CM | POA: Insufficient documentation

## 2015-01-29 DIAGNOSIS — W19XXXA Unspecified fall, initial encounter: Secondary | ICD-10-CM

## 2015-01-29 DIAGNOSIS — S0990XA Unspecified injury of head, initial encounter: Secondary | ICD-10-CM | POA: Diagnosis present

## 2015-01-29 DIAGNOSIS — Z8673 Personal history of transient ischemic attack (TIA), and cerebral infarction without residual deficits: Secondary | ICD-10-CM | POA: Insufficient documentation

## 2015-01-29 DIAGNOSIS — E119 Type 2 diabetes mellitus without complications: Secondary | ICD-10-CM | POA: Diagnosis not present

## 2015-01-29 DIAGNOSIS — S060X0A Concussion without loss of consciousness, initial encounter: Secondary | ICD-10-CM | POA: Insufficient documentation

## 2015-01-29 LAB — CBG MONITORING, ED: Glucose-Capillary: 127 mg/dL — ABNORMAL HIGH (ref 65–99)

## 2015-01-29 MED ORDER — HYDROCODONE-ACETAMINOPHEN 5-325 MG PO TABS
1.0000 | ORAL_TABLET | Freq: Once | ORAL | Status: AC
  Administered 2015-01-29: 1 via ORAL
  Filled 2015-01-29: qty 1

## 2015-01-29 MED ORDER — ONDANSETRON HCL 4 MG/2ML IJ SOLN
4.0000 mg | Freq: Once | INTRAMUSCULAR | Status: DC
  Filled 2015-01-29: qty 2

## 2015-01-29 MED ORDER — MECLIZINE HCL 25 MG PO TABS: 25.0000 mg | ORAL_TABLET | Freq: Three times a day (TID) | ORAL | Status: DC | PRN

## 2015-01-29 MED ORDER — HYDROCODONE-ACETAMINOPHEN 5-325 MG PO TABS: 1.0000 | ORAL_TABLET | ORAL | Status: DC | PRN

## 2015-01-29 MED ORDER — ONDANSETRON 4 MG PO TBDP: 4.0000 mg | ORAL_TABLET | Freq: Three times a day (TID) | ORAL | Status: DC | PRN

## 2015-01-29 MED ORDER — MORPHINE SULFATE 4 MG/ML IJ SOLN
4.0000 mg | Freq: Once | INTRAMUSCULAR | Status: AC
  Administered 2015-01-29: 4 mg via INTRAVENOUS
  Filled 2015-01-29: qty 1

## 2015-01-29 MED ORDER — MECLIZINE HCL 25 MG PO TABS
25.0000 mg | ORAL_TABLET | Freq: Once | ORAL | Status: AC
  Administered 2015-01-29: 25 mg via ORAL
  Filled 2015-01-29: qty 1

## 2015-01-29 MED ORDER — ONDANSETRON HCL 4 MG/2ML IJ SOLN
4.0000 mg | Freq: Once | INTRAMUSCULAR | Status: AC
  Administered 2015-01-29: 4 mg via INTRAVENOUS

## 2015-01-29 NOTE — ED Notes (Signed)
MD made aware of patient's neuro exam.

## 2015-01-29 NOTE — ED Notes (Signed)
Per EMS, Patient was in a standing position when she slipped on a rug and hit back on tile floor. Patient hit the back of head and has a hematoma noted to the posterior head with complaint of burning. Denies LOC, but patient complains of nausea, dizziness, and blurred vision. Vitals per EMS: 150/90, 74 HR, 93%-98% on RA, 154 CBG.

## 2015-01-29 NOTE — ED Provider Notes (Signed)
Patient slipped on a rubber mat this afternoon and fell, striking her head and back. She denies loss of consciousness. Since the event she complains of back pain, headache and neck pain and feels as if the room is spinning. She's does suffer from chronic vertigo as result of multiple sclerosis. She also complains of nausea present. Presently alert Glasgow Coma Score 15, HEENT exam no facial asymmetry neck no step-off no point tenderness lungs clear equal breath sounds heart regular rate and rhythm abdomen obese nontender neurologic Glasgow Coma Score 15 cranial nerves II through XII intact moves all extremities well  Orlie Dakin, MD 01/30/15 [phone]

## 2015-01-29 NOTE — ED Provider Notes (Signed)
History   Chief Complaint  Patient presents with  . Fall    HPI 64 year old female past medical history as below notable for diabetes, MS, fragile X syndrome with chronic ataxia, vertigo who presents to ED today after a mechanical fall at the hair salon. This happened at 3:30 PM. Patient reports she tripped on something and subsequent fell backwards and hit her left posterior head. Patient says she may have lost consciousness briefly but remembers being able to hear throughout this and there was no report of LOC by bystanders. She reports having immediate room spinning sensation and pain to her left head. She is not on blood thinners. Patient rates pain as moderate to severe. Denies any nausea, vomiting, numbness, tingling, vision changes, weakness. Patient reports she does have slight worsening of her chronic back pain primarily over the upper midline back in the lower midline back. Denies any bowel or bladder incontinence, saddle anesthesia. No other complaints at this time.  Past medical/surgical history, social history, medications, allergies and FH have been reviewed with patient and/or in documentation.  Past Medical History  Diagnosis Date  . Seizures   . Diabetes mellitus without complication   . Fragile X syndrome     ataxia syndrome.    [name] Hypertension   . Hemorrhoids   . Transient cerebral ischemia   . Vertigo, benign paroxysmal   . MS (multiple sclerosis)   . Cerebellar ataxia   . Stroke    Past Surgical History  Procedure Laterality Date  . Abdominal hysterectomy    . Fracture surgery      tib/fib (R) leg   Family History  Problem Relation Age of Onset  . Stroke    . Fragile X syndrome Son     Patient is carrier  . Raynaud syndrome Mother   . Alzheimer's disease Father   . Diabetes Father   . Diabetes Sister   . Cancer Brother     brother  . Cancer Maternal Grandmother     cervical & breast   . Heart failure Maternal Grandfather   . Diabetes Paternal  Grandmother    History  Substance Use Topics  . Smoking status: Never Smoker   . Smokeless tobacco: Never Used  . Alcohol Use: No     Review of Systems Constitutional: Negative for fever, chills and fatigue.  HENT: Negative for congestion, rhinorrhea and sore throat.   Eyes: Negative for visual disturbance.  Respiratory: Negative for cough, shortness of breath and wheezing.   Cardiovascular: Negative for chest pain.  Gastrointestinal: Negative for nausea, vomiting, abdominal pain and diarrhea.  Genitourinary: Negative for flank pain, dysuria, frequency.  Musculoskeletal: + for back pain, neck pain, neg leg pain/swelling.  Skin: Negative for rash.  Neurological: + for dizziness and headaches.  All other systems reviewed and are negative.   Physical Exam  Physical Exam ED Triage Vitals  Enc Vitals Group     BP 01/29/15 1649 142/93 mmHg     Pulse Rate 01/29/15 1649 94     Resp 01/29/15 1649 18     Temp 01/29/15 1649 98 F (36.7 C)     Temp Source 01/29/15 1649 Oral     SpO2 01/29/15 1649 96 %     Weight --      Height --      Head Cir --      Peak Flow --      Pain Score 01/29/15 1651 8     Pain Loc --  Pain Edu? --      Excl. in [place]? --     General: awake. AAOx3. WD, WN HENT:  Small contusion to the left posterior scalp without signs of laceration or abrasion and no palpable skull defect; pupils 3 mm, equal, round, reactive; s/p cataract surgery; EOMs intact. No signs of ocular entrapment, Battle sign, raccoon eyes, nasal septal hematoma, hemotympanum, midface instability or deformity, apparent oral injury Neck: supple, trachea midline, cervical collar in place, no midline C spine ttp Cardio: RRR.  No JVD.  2+ pulses in bilateral upper and lower extremities. No peripheral edema. Pulm:   CTAB, no r/r/g. Normal respiratory effort Chest wall: stable to AP/LAT compression, chest wall non-tender, no obvious clavicle deformity Abd: soft, NT/ND. MSK: Extremities  atraumatic, NVI.  Spine: without obvious step off, tenderness or signs of injury.  Neuro: GCS 15. No focal deficit. Normal strength/sensation/muscle tone.   ED Course  Procedures   Labs Reviewed  CBG MONITORING, ED - Abnormal; Notable for the following:    Glucose-Capillary 127 (*)    All other components within normal limits   I personally reviewed and interpreted all labs.  Dg Thoracic Spine 2 View  01/29/2015   CLINICAL DATA:  Dorsalgia following fall  EXAM: THORACIC SPINE 3 VIEWS  COMPARISON:  Chest radiograph Oct 24, 2014  FINDINGS: Frontal, lateral, and swimmer's views obtained. There is no fracture or spondylolisthesis. There is degenerative type change in the lower cervical and upper thoracic spine. No erosive change.  IMPRESSION: Areas of mild osteoarthritic change. No fracture or spondylolisthesis.   Electronically Signed   By: Lowella Grip III M.D.   On: 01/29/2015 18:27   Dg Lumbar Spine Complete  01/29/2015   CLINICAL DATA:  Pain following fall  EXAM: LUMBAR SPINE - COMPLETE 4+ VIEW  COMPARISON:  None.  FINDINGS: Frontal, lateral, spot lumbosacral lateral, and bilateral oblique views were obtained. There are 5 non-rib-bearing lumbar type vertebral bodies. There is slight thoracolumbar levoscoliosis. There is no fracture or spondylolisthesis. There is moderately severe narrowing at L1-2. There is moderate narrowing at L2-3. There is slightly less narrowing at L5-S1. There is mild facet osteoarthritic change at L5-S1 bilaterally.  IMPRESSION: Areas of osteoarthritic change. Disc space narrowing is most pronounced at L1-2. No fracture or spondylolisthesis. Mild thoracolumbar levoscoliosis.   Electronically Signed   By: Lowella Grip III M.D.   On: 01/29/2015 18:29   Ct Head Wo Contrast  01/29/2015   CLINICAL DATA:  Status post fall.  EXAM: CT HEAD WITHOUT CONTRAST  CT CERVICAL SPINE WITHOUT CONTRAST  TECHNIQUE: Multidetector CT imaging of the head and cervical spine was  performed following the standard protocol without intravenous contrast. Multiplanar CT image reconstructions of the cervical spine were also generated.  COMPARISON:  08/26/2013  FINDINGS: CT HEAD FINDINGS  There is no evidence of mass effect, midline shift, or extra-axial fluid collections. There is no evidence of a space-occupying lesion or intracranial hemorrhage. There is no evidence of a cortical-based area of acute infarction. There is generalized cerebral atrophy. There is periventricular white matter low attenuation likely secondary to microangiopathy.  The ventricles and sulci are appropriate for the patient's age. The basal cisterns are patent.  Visualized portions of the orbits are unremarkable. The visualized portions of the paranasal sinuses and mastoid air cells are unremarkable.  The osseous structures are unremarkable.  CT CERVICAL SPINE FINDINGS  The alignment is anatomic. The vertebral body heights are maintained. There is loss of the normal cervical lordosis  with straightening. There is no acute fracture. The prevertebral soft tissues are normal. The intraspinal soft tissues are not fully imaged on this examination due to poor soft tissue contrast, but there is no gross soft tissue abnormality.  There is degenerative disc disease with disc height loss at C5-6 and C6-7. There broad-based disc osteophyte complexes at C5-6 and C6-7. Left uncovertebral degenerative changes and mild left facet arthropathy at C5-6 resulting in left foraminal narrowing. Bilateral uncovertebral degenerative changes at C6-7 with bilateral foraminal narrowing, left greater than right.  The visualized portions of the lung apices demonstrate no focal abnormality.  IMPRESSION: 1. No acute intracranial pathology. 2. No acute osseous injury of the cervical spine. 3. Cervical spine spondylosis as described above.   Electronically Signed   By: Kathreen Devoid   On: 01/29/2015 18:07   Ct Cervical Spine Wo Contrast  01/29/2015    CLINICAL DATA:  Status post fall.  EXAM: CT HEAD WITHOUT CONTRAST  CT CERVICAL SPINE WITHOUT CONTRAST  TECHNIQUE: Multidetector CT imaging of the head and cervical spine was performed following the standard protocol without intravenous contrast. Multiplanar CT image reconstructions of the cervical spine were also generated.  COMPARISON:  08/26/2013  FINDINGS: CT HEAD FINDINGS  There is no evidence of mass effect, midline shift, or extra-axial fluid collections. There is no evidence of a space-occupying lesion or intracranial hemorrhage. There is no evidence of a cortical-based area of acute infarction. There is generalized cerebral atrophy. There is periventricular white matter low attenuation likely secondary to microangiopathy.  The ventricles and sulci are appropriate for the patient's age. The basal cisterns are patent.  Visualized portions of the orbits are unremarkable. The visualized portions of the paranasal sinuses and mastoid air cells are unremarkable.  The osseous structures are unremarkable.  CT CERVICAL SPINE FINDINGS  The alignment is anatomic. The vertebral body heights are maintained. There is loss of the normal cervical lordosis with straightening. There is no acute fracture. The prevertebral soft tissues are normal. The intraspinal soft tissues are not fully imaged on this examination due to poor soft tissue contrast, but there is no gross soft tissue abnormality.  There is degenerative disc disease with disc height loss at C5-6 and C6-7. There broad-based disc osteophyte complexes at C5-6 and C6-7. Left uncovertebral degenerative changes and mild left facet arthropathy at C5-6 resulting in left foraminal narrowing. Bilateral uncovertebral degenerative changes at C6-7 with bilateral foraminal narrowing, left greater than right.  The visualized portions of the lung apices demonstrate no focal abnormality.  IMPRESSION: 1. No acute intracranial pathology. 2. No acute osseous injury of the cervical  spine. 3. Cervical spine spondylosis as described above.   Electronically Signed   By: Kathreen Devoid   On: 01/29/2015 18:07   I personally viewed above image(s) which were used in my medical decision making. Formal interpretations by Radiology.   EKG Interpretation  Date/Time:  Monday January 29 2015 16:50:08 EDT Ventricular Rate:  96 PR Interval:  159 QRS Duration: 77 QT Interval:  362 QTC Calculation: 457 R Axis:   66 Text Interpretation:  Sinus rhythm Low voltage, precordial leads Abnormal R-wave progression, early transition Borderline T abnormalities, anterior leads No significant change since last tracing Confirmed by Winfred Leeds  MD, SAM [phone]) on 01/29/2015 4:55:01 PM        MDM: Primary intact as below. Airway: Adequate  Breathing: Spontaneous     Pneumothorax: No   Hemothorax: No   Chest Tubes Required: No  Circulating: Heart Rate:  Pulse Rate: 93   Blood Pressure: BP: 137/86 mmHg  IV  Access: IV Access Adequate  Neurological: PERL: Yes   Response to Voice: Yes   Response to Pain: Yes  Disability: Limbs noted to be moving: Right Arm, Left Arm, Right Leg and Left Leg  Other Interventions:    Remainder of secondary survey as detailed above in PE section.   Patient is contusion to posterior head. Patient will receive CT head and C-spine for further evaluation as well as plain films of her T and L-spine for further evaluation.  Workup is negative for traumatic findings.  Patient not complaining of dizziness in ED and patient was treated with meclizine and Zofran. Patient was also given morphine for her head pain.  Patient was observed in ED for several hours and is ambulating well and symptoms have resolved.  Strict return precautions have been discussed at length and patient and family members questions have been answered. Patient woke a home with meclizine, Zofran, Norco.  Stable for discharge.  Clinical Impression: 1. Fall, initial encounter   2. Fall   3.  Contusion of head, initial encounter   4. Concussion, without loss of consciousness, initial encounter     Disposition: Discharge  Condition: Good  I have discussed the results, Dx and Tx plan with the pt(& family if present). He/she/they expressed understanding and agree(s) with the plan. Discharge instructions discussed at great length. Strict return precautions discussed and pt &/or family have verbalized understanding of the instructions. No further questions at time of discharge.    New Prescriptions   HYDROCODONE-ACETAMINOPHEN (NORCO/VICODIN) 5-325 MG PER TABLET    Take 1 tablet by mouth every 4 (four) hours as needed for severe pain.   MECLIZINE (ANTIVERT) 25 MG TABLET    Take 1 tablet (25 mg total) by mouth 3 (three) times daily as needed for dizziness.   ONDANSETRON (ZOFRAN ODT) 4 MG DISINTEGRATING TABLET    Take 1 tablet (4 mg total) by mouth every 8 (eight) hours as needed for nausea or vomiting.    Follow Up: Olga Millers, MD [place] [id number] [phone]   As needed  [facility] [address] [id number] [place] [phone]  If symptoms worsen   Pt seen in conjunction with Dr. Orlie Dakin, MD  Kirstie Peri, [facility] Resident - PGY-3        Kirstie Peri, MD 01/29/15 [id number]  Orlie Dakin, MD 01/30/15 [phone]

## 2015-01-29 NOTE — Discharge Instructions (Signed)
Concussion A concussion, or closed-head injury, is a brain injury caused by a direct blow to the head or by a quick and sudden movement (jolt) of the head or neck. Concussions are usually not life-threatening. Even so, the effects of a concussion can be serious. If you have had a concussion before, you are more likely to experience concussion-like symptoms after a direct blow to the head.  CAUSES  Direct blow to the head, such as from running into another player during a soccer game, being hit in a fight, or hitting your head on a hard surface.  A jolt of the head or neck that causes the brain to move back and forth inside the skull, such as in a car crash. SIGNS AND SYMPTOMS The signs of a concussion can be hard to notice. Early on, they may be missed by you, family members, and health care providers. You may look fine but act or feel differently. Symptoms are usually temporary, but they may last for days, weeks, or even longer. Some symptoms may appear right away while others may not show up for hours or days. Every head injury is different. Symptoms include:  Mild to moderate headaches that will not go away.  A feeling of pressure inside your head.  Having more trouble than usual:  Learning or remembering things you have heard.  Answering questions.  Paying attention or concentrating.  Organizing daily tasks.  Making decisions and solving problems.  Slowness in thinking, acting or reacting, speaking, or reading.  Getting lost or being easily confused.  Feeling tired all the time or lacking energy (fatigued).  Feeling drowsy.  Sleep disturbances.  Sleeping more than usual.  Sleeping less than usual.  Trouble falling asleep.  Trouble sleeping (insomnia).  Loss of balance or feeling lightheaded or dizzy.  Nausea or vomiting.  Numbness or tingling.  Increased sensitivity to:  Sounds.  Lights.  Distractions.  Vision problems or eyes that tire  easily.  Diminished sense of taste or smell.  Ringing in the ears.  Mood changes such as feeling sad or anxious.  Becoming easily irritated or angry for little or no reason.  Lack of motivation.  Seeing or hearing things other people do not see or hear (hallucinations). DIAGNOSIS Your health care provider can usually diagnose a concussion based on a description of your injury and symptoms. He or she will ask whether you passed out (lost consciousness) and whether you are having trouble remembering events that happened right before and during your injury. Your evaluation might include:  A brain scan to look for signs of injury to the brain. Even if the test shows no injury, you may still have a concussion.  Blood tests to be sure other problems are not present. TREATMENT  Concussions are usually treated in an emergency department, in urgent care, or at a clinic. You may need to stay in the hospital overnight for further treatment.  Tell your health care provider if you are taking any medicines, including prescription medicines, over-the-counter medicines, and natural remedies. Some medicines, such as blood thinners (anticoagulants) and aspirin, may increase the chance of complications. Also tell your health care provider whether you have had alcohol or are taking illegal drugs. This information may affect treatment.  Your health care provider will send you home with important instructions to follow.  How fast you will recover from a concussion depends on many factors. These factors include how severe your concussion is, what part of your brain was injured, your  age, and how healthy you were before the concussion.  Most people with mild injuries recover fully. Recovery can take time. In general, recovery is slower in older persons. Also, persons who have had a concussion in the past or have other medical problems may find that it takes longer to recover from their current injury. HOME  CARE INSTRUCTIONS General Instructions  Carefully follow the directions your health care provider gave you.  Only take over-the-counter or prescription medicines for pain, discomfort, or fever as directed by your health care provider.  Take only those medicines that your health care provider has approved.  Do not drink alcohol until your health care provider says you are well enough to do so. Alcohol and certain other drugs may slow your recovery and can put you at risk of further injury.  If it is harder than usual to remember things, write them down.  If you are easily distracted, try to do one thing at a time. For example, do not try to watch TV while fixing dinner.  Talk with family members or close friends when making important decisions.  Keep all follow-up appointments. Repeated evaluation of your symptoms is recommended for your recovery.  Watch your symptoms and tell others to do the same. Complications sometimes occur after a concussion. Older adults with a brain injury may have a higher risk of serious complications, such as a blood clot on the brain.  Tell your teachers, school nurse, school counselor, coach, athletic trainer, or work Freight forwarder about your injury, symptoms, and restrictions. Tell them about what you can or cannot do. They should watch for:  Increased problems with attention or concentration.  Increased difficulty remembering or learning new information.  Increased time needed to complete tasks or assignments.  Increased irritability or decreased ability to cope with stress.  Increased symptoms.  Rest. Rest helps the brain to heal. Make sure you:  Get plenty of sleep at night. Avoid staying up late at night.  Keep the same bedtime hours on weekends and weekdays.  Rest during the day. Take daytime naps or rest breaks when you feel tired.  Limit activities that require a lot of thought or concentration. These include:  Doing homework or job-related  work.  Watching TV.  Working on the computer.  Avoid any situation where there is potential for another head injury (football, hockey, soccer, basketball, martial arts, downhill snow sports and horseback riding). Your condition will get worse every time you experience a concussion. You should avoid these activities until you are evaluated by the appropriate follow-up health care providers. Returning To Your Regular Activities You will need to return to your normal activities slowly, not all at once. You must give your body and brain enough time for recovery.  Do not return to sports or other athletic activities until your health care provider tells you it is safe to do so.  Ask your health care provider when you can drive, ride a bicycle, or operate heavy machinery. Your ability to react may be slower after a brain injury. Never do these activities if you are dizzy.  Ask your health care provider about when you can return to work or school. Preventing Another Concussion It is very important to avoid another brain injury, especially before you have recovered. In rare cases, another injury can lead to permanent brain damage, brain swelling, or death. The risk of this is greatest during the first 7-10 days after a head injury. Avoid injuries by:  Wearing a seat  belt when riding in a car.  Drinking alcohol only in moderation.  Wearing a helmet when biking, skiing, skateboarding, skating, or doing similar activities.  Avoiding activities that could lead to a second concussion, such as contact or recreational sports, until your health care provider says it is okay.  Taking safety measures in your home.  Remove clutter and tripping hazards from floors and stairways.  Use grab bars in bathrooms and handrails by stairs.  Place non-slip mats on floors and in bathtubs.  Improve lighting in dim areas. SEEK MEDICAL CARE IF:  You have increased problems paying attention or  concentrating.  You have increased difficulty remembering or learning new information.  You need more time to complete tasks or assignments than before.  You have increased irritability or decreased ability to cope with stress.  You have more symptoms than before. Seek medical care if you have any of the following symptoms for more than 2 weeks after your injury:  Lasting (chronic) headaches.  Dizziness or balance problems.  Nausea.  Vision problems.  Increased sensitivity to noise or light.  Depression or mood swings.  Anxiety or irritability.  Memory problems.  Difficulty concentrating or paying attention.  Sleep problems.  Feeling tired all the time. SEEK IMMEDIATE MEDICAL CARE IF:  You have severe or worsening headaches. These may be a sign of a blood clot in the brain.  You have weakness (even if only in one hand, leg, or part of the face).  You have numbness.  You have decreased coordination.  You vomit repeatedly.  You have increased sleepiness.  One pupil is larger than the other.  You have convulsions.  You have slurred speech.  You have increased confusion. This may be a sign of a blood clot in the brain.  You have increased restlessness, agitation, or irritability.  You are unable to recognize people or places.  You have neck pain.  It is difficult to wake you up.  You have unusual behavior changes.  You lose consciousness. MAKE SURE YOU:  Understand these instructions.  Will watch your condition.  Will get help right away if you are not doing well or get worse. Document Released: 08/30/2003 Document Revised: 06/14/2013 Document Reviewed: 12/30/2012 [facility] Patient Information 2015 [place], [place]. This information is not intended to replace advice given to you by your health care provider. Make sure you discuss any questions you have with your health care provider.  Contusion A contusion is a deep bruise. Contusions are the  result of an injury that caused bleeding under the skin. The contusion may turn blue, purple, or yellow. Minor injuries will give you a painless contusion, but more severe contusions may stay painful and swollen for a few weeks.  CAUSES  A contusion is usually caused by a blow, trauma, or direct force to an area of the body. SYMPTOMS   Swelling and redness of the injured area.  Bruising of the injured area.  Tenderness and soreness of the injured area.  Pain. DIAGNOSIS  The diagnosis can be made by taking a history and physical exam. An X-ray, CT scan, or MRI may be needed to determine if there were any associated injuries, such as fractures. TREATMENT  Specific treatment will depend on what area of the body was injured. In general, the best treatment for a contusion is resting, icing, elevating, and applying cold compresses to the injured area. Over-the-counter medicines may also be recommended for pain control. Ask your caregiver what the best treatment is  for your contusion. HOME CARE INSTRUCTIONS   Put ice on the injured area.  Put ice in a plastic bag.  Place a towel between your skin and the bag.  Leave the ice on for 15-20 minutes, 3-4 times a day, or as directed by your health care provider.  Only take over-the-counter or prescription medicines for pain, discomfort, or fever as directed by your caregiver. Your caregiver may recommend avoiding anti-inflammatory medicines (aspirin, ibuprofen, and naproxen) for 48 hours because these medicines may increase bruising.  Rest the injured area.  If possible, elevate the injured area to reduce swelling. SEEK IMMEDIATE MEDICAL CARE IF:   You have increased bruising or swelling.  You have pain that is getting worse.  Your swelling or pain is not relieved with medicines. MAKE SURE YOU:   Understand these instructions.  Will watch your condition.  Will get help right away if you are not doing well or get worse. Document  Released: 03/19/2005 Document Revised: 06/14/2013 Document Reviewed: 04/14/2011 [facility] Patient Information 2015 [place], [place]. This information is not intended to replace advice given to you by your health care provider. Make sure you discuss any questions you have with your health care provider.

## 2015-01-29 NOTE — ED Notes (Signed)
MD Jacobuwitz at the bedside  

## 2015-01-29 NOTE — ED Notes (Signed)
Was asked to walk patient. Patient was unable to sit on side without being dizzy, leaning backward, and to the right. Informed Dr. Hanley Ben.

## 2015-01-29 NOTE — ED Notes (Signed)
Patient returned from CT and Xray

## 2015-01-29 NOTE — ED Notes (Signed)
MD at the bedside  

## 2015-01-31 ENCOUNTER — Telehealth: Payer: Self-pay | Admitting: *Deleted

## 2015-01-31 NOTE — Telephone Encounter (Signed)
Patient has had a recent fall with a concussion and was calling to see if Dr. [name] needed to see her after her hospital  Visit Call back number [phone]

## 2015-01-31 NOTE — Telephone Encounter (Signed)
I spoke with patient she has a follow up appt on 02/05/15

## 2015-01-31 NOTE — Telephone Encounter (Signed)
     Please advise   Patient has had a recent fall with a concussion and was calling to see if Dr. [name] needed to see her after her hospital Visit

## 2015-01-31 NOTE — Telephone Encounter (Signed)
If she was seen in the ED, she doesn't need to see me acutely.  However, she is supposed to follow up with me anyway to discuss possible changes in her seizure medications.

## 2015-02-01 ENCOUNTER — Encounter: Payer: Self-pay | Admitting: Internal Medicine

## 2015-02-01 ENCOUNTER — Ambulatory Visit: Payer: PPO | Admitting: Internal Medicine

## 2015-02-01 ENCOUNTER — Ambulatory Visit (INDEPENDENT_AMBULATORY_CARE_PROVIDER_SITE_OTHER): Payer: PPO | Admitting: Internal Medicine

## 2015-02-01 VITALS — BP 108/82 | HR 99 | Temp 98.1°F | Ht 64.0 in | Wt 224.0 lb

## 2015-02-01 DIAGNOSIS — S0990XD Unspecified injury of head, subsequent encounter: Secondary | ICD-10-CM | POA: Diagnosis not present

## 2015-02-01 DIAGNOSIS — G40909 Epilepsy, unspecified, not intractable, without status epilepticus: Secondary | ICD-10-CM | POA: Diagnosis not present

## 2015-02-01 DIAGNOSIS — E119 Type 2 diabetes mellitus without complications: Secondary | ICD-10-CM

## 2015-02-01 DIAGNOSIS — G40309 Generalized idiopathic epilepsy and epileptic syndromes, not intractable, without status epilepticus: Secondary | ICD-10-CM

## 2015-02-01 MED ORDER — ATORVASTATIN CALCIUM 80 MG PO TABS: 80.0000 mg | ORAL_TABLET | Freq: Every day | ORAL | Status: DC

## 2015-02-01 MED ORDER — PHENOBARBITAL 97.2 MG PO TABS: 97.2000 mg | ORAL_TABLET | Freq: Every day | ORAL | Status: DC

## 2015-02-01 NOTE — Progress Notes (Signed)
Pre visit review using our clinic review tool, if applicable. No additional management support is needed unless otherwise documented below in the visit note. 

## 2015-02-01 NOTE — Patient Instructions (Signed)

## 2015-02-03 ENCOUNTER — Encounter: Payer: Self-pay | Admitting: Internal Medicine

## 2015-02-03 DIAGNOSIS — S0990XA Unspecified injury of head, initial encounter: Secondary | ICD-10-CM | POA: Insufficient documentation

## 2015-02-03 NOTE — Progress Notes (Signed)
Subjective:  Patient ID: Brandi Ramsey, female    DOB: 13-Oct-1950  Age: 64 y.o. MRN: [id number]  CC: Head Injury  New to me  HPI Brandi Ramsey presents for a follow-up after recent head injury. She tells me that 3 days ago she was walking into a beauty salon and she stepped on a slippery mat and fell and hit the back of her head. At the time she had pain and swelling in the back of her head and she was seen in the emergency room, she had a normal CT scan. She reports today for follow-up and tells me her only ongoing symptoms are fatigue and dizziness. She has a myriad of ongoing symptoms from chronic medical illnesses but none of those are worse than before after this recent head injury. She tells me she has an appointment with her neurologist in the next 5 days.  History Brandi Ramsey has a past medical history of Seizures; Diabetes mellitus without complication; Fragile X syndrome; Hypertension; Hemorrhoids; Transient cerebral ischemia; Vertigo, benign paroxysmal; MS (multiple sclerosis); Cerebellar ataxia; and Stroke.   She has past surgical history that includes Abdominal hysterectomy and Fracture surgery.   Her family history includes Alzheimer's disease in her father; Cancer in her brother and maternal grandmother; Diabetes in her father, paternal grandmother, and sister; Fragile X syndrome in her Ramsey; Heart failure in her maternal grandfather; Raynaud syndrome in her mother; Stroke in an other family member.She reports that she has never smoked. She has never used smokeless tobacco. She reports that she does not drink alcohol or use illicit drugs.  Outpatient Prescriptions Prior to Visit  Medication Sig Dispense Refill  . aspirin EC 81 MG EC tablet Take 1 tablet (81 mg total) by mouth daily.    [name] Besifloxacin HCl (BESIVANCE) 0.6 % SUSP Place 1 drop into the left eye 3 (three) times daily.    [name] BREO ELLIPTA 100-25 MCG/INH AEPB Inhale 1 puff into the lungs daily.   11  . Bromfenac Sodium  (PROLENSA) 0.07 % SOLN Place 1 drop into the left eye at bedtime.    . Calcium-Vitamin D 600-200 MG-UNIT per tablet Take 1 tablet by mouth 2 (two) times daily.    . cetirizine (ZYRTEC) 10 MG tablet Take 10 mg by mouth daily.  5  . clonazePAM (KLONOPIN) 1 MG tablet Take 1 tablet (1 mg total) by mouth 3 (three) times daily as needed for anxiety. 90 tablet 0  . [employer] CALCIUM 600+D 600-800 MG-UNIT TABS Take 1 tablet by mouth 2 (two) times daily with a meal.  3  . DULoxetine (CYMBALTA) 30 MG capsule Take 1 capsule (30 mg total) by mouth daily. 30 capsule 3  . HYDROcodone-acetaminophen (NORCO/VICODIN) 5-325 MG per tablet Take 1 tablet by mouth every 4 (four) hours as needed for severe pain. 10 tablet 0  . meclizine (ANTIVERT) 25 MG tablet Take 1 tablet (25 mg total) by mouth 3 (three) times daily as needed for dizziness. 30 tablet 0  . metoprolol succinate (TOPROL-XL) 25 MG 24 hr tablet Take 25 mg by mouth at bedtime.    . ondansetron (ZOFRAN ODT) 4 MG disintegrating tablet Take 1 tablet (4 mg total) by mouth every 8 (eight) hours as needed for nausea or vomiting. 20 tablet 0  . pantoprazole (PROTONIX) 40 MG tablet Take 1 tablet (40 mg total) by mouth 2 (two) times daily. 60 tablet 3  . ranitidine (ZANTAC) 150 MG tablet Take 150 mg by mouth 2 (two) times daily  as needed (for indigestion).     [name] tetrahydrozoline (VISINE) 0.05 % ophthalmic solution Place 2 drops into both eyes 5 (five) times daily as needed (for dry eyes).    . Vitamin D, Ergocalciferol, (DRISDOL) 50000 UNITS CAPS capsule Take 50,000 Units by mouth once a week. Take on Fridays    . atorvastatin (LIPITOR) 80 MG tablet Take 80 mg by mouth at bedtime.     [name] PHENobarbital (LUMINAL) 97.2 MG tablet Take 97.2 mg by mouth at bedtime.     No facility-administered medications prior to visit.    ROS Review of Systems  Constitutional: Positive for fatigue. Negative for fever, chills, diaphoresis, activity change, appetite change and unexpected  weight change.  HENT: Negative.  Negative for trouble swallowing and voice change.   Eyes: Negative.  Negative for photophobia and visual disturbance.  Respiratory: Negative.  Negative for cough, choking, chest tightness, shortness of breath and stridor.   Cardiovascular: Negative.  Negative for chest pain, palpitations and leg swelling.  Gastrointestinal: Negative.  Negative for nausea, vomiting, abdominal pain, diarrhea, constipation and blood in stool.  Genitourinary: Negative.   Musculoskeletal: Positive for gait problem (she chronically feels off balance.). Negative for myalgias, back pain, joint swelling, arthralgias, neck pain and neck stiffness.  Skin: Negative.  Negative for rash.  Neurological: Positive for dizziness, tremors (chronic and unchanged) and weakness ( generalized, chronic, unchanged). Negative for seizures, syncope, facial asymmetry, speech difficulty, light-headedness, numbness and headaches.  Hematological: Negative.  Negative for adenopathy. Does not bruise/bleed easily.  Psychiatric/Behavioral: Negative.     Objective:  BP 108/82 mmHg  Pulse 99  Temp(Src) 98.1 F (36.7 C) (Oral)  Ht 5' 4"  (1.626 m)  Wt 224 lb (101.606 kg)  BMI 38.43 kg/m2  SpO2 96%  Physical Exam  Constitutional: She is oriented to person, place, and time. She appears well-developed and well-nourished. No distress.  HENT:  Head: Normocephalic and atraumatic. Head is without raccoon's eyes, without Battle's sign, without contusion and without laceration.  Right Ear: No hemotympanum.  Left Ear: No hemotympanum.  Mouth/Throat: Oropharynx is clear and moist and mucous membranes are normal. Mucous membranes are not pale, not dry and not cyanotic. No oropharyngeal exudate.  Eyes: Conjunctivae and EOM are normal. Pupils are equal, round, and reactive to light. Right eye exhibits no discharge. Left eye exhibits no discharge. No scleral icterus.  Neck: Normal range of motion. Neck supple. No JVD  present. No tracheal deviation present. No thyromegaly present.  Cardiovascular: Normal rate, regular rhythm, normal heart sounds and intact distal pulses.  Exam reveals no gallop and no friction rub.   No murmur heard. Pulmonary/Chest: Breath sounds normal. No stridor. No respiratory distress. She has no wheezes. She has no rales. She exhibits no tenderness.  Abdominal: Soft. Bowel sounds are normal. She exhibits no distension and no mass. There is no tenderness. There is no rebound and no guarding.  Musculoskeletal: Normal range of motion. She exhibits no edema or tenderness.  Lymphadenopathy:    She has no cervical adenopathy.  Neurological: She is alert and oriented to person, place, and time. She has normal strength. She displays no atrophy, no tremor and normal reflexes. No cranial nerve deficit or sensory deficit. She exhibits normal muscle tone. She displays a negative Romberg sign. She displays no seizure activity. Coordination and gait normal. She displays no Babinski's sign on the right side. She displays no Babinski's sign on the left side.  Reflex Scores:      Tricep reflexes are  1+ on the right side and 1+ on the left side.      Bicep reflexes are 1+ on the right side and 1+ on the left side.      Brachioradialis reflexes are 1+ on the right side and 1+ on the left side.      Patellar reflexes are 1+ on the right side and 1+ on the left side.      Achilles reflexes are 0 on the right side and 0 on the left side. Skin: Skin is dry. No rash noted. She is not diaphoretic. No erythema. No pallor.  Psychiatric: She has a normal mood and affect. Her behavior is normal. Judgment and thought content normal.   Lab Results  Component Value Date   WBC 12.7* 10/25/2014   HGB 13.9 10/25/2014   HCT 42.9 10/25/2014   PLT 280 10/25/2014   GLUCOSE 116* 12/14/2014   CHOL 152 08/27/2013   TRIG 118 08/27/2013   HDL 39* 08/27/2013   LDLCALC 89 08/27/2013   ALT 17 12/14/2014   AST 17 12/14/2014    NA 138 12/14/2014   K 3.9 12/14/2014   CL 105 12/14/2014   CREATININE 0.91 12/14/2014   BUN 11 12/14/2014   CO2 28 12/14/2014   TSH 0.83 12/14/2014   HGBA1C 6.9* 12/14/2014     Assessment & Plan:   Brandi Ramsey was seen today for head injury.  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 She tells me that she is running out of phenobarbital and requested a refill, I am not her PCP or her neurologist so I gave her a 30 day supply and asked her to follow-up with her neurologist next week as directed -     PHENobarbital (LUMINAL) 97.2 MG tablet; Take 1 tablet (97.2 mg total) by mouth at bedtime.  Type 2 diabetes mellitus without complication- recent F0Y shows that her blood sugars are well-controlled. -     atorvastatin (LIPITOR) 80 MG tablet; Take 1 tablet (80 mg total) by mouth at bedtime.  Head injury, subsequent encounter- her CT scan and exam are within normal limits. She appears to be recovering as well as expected. She, her husband and I talked about ordering an MRI but they have deferred at this time, I don't think an MRI is indicated in light of her paucity of symptoms and normal exam. She will discuss this further with her neurologist next week. -     Ambulatory referral to Neurology   I have changed Brandi Ramsey's atorvastatin and PHENobarbital. I am also having her maintain her Vitamin D (Ergocalciferol), metoprolol succinate, Calcium-Vitamin D, ranitidine, tetrahydrozoline, [employer] CALCIUM 600+D, cetirizine, BREO ELLIPTA, aspirin, DULoxetine, clonazePAM, pantoprazole, Besifloxacin HCl, Bromfenac Sodium, meclizine, ondansetron, and HYDROcodone-acetaminophen.  Meds ordered this encounter  Medications  . atorvastatin (LIPITOR) 80 MG tablet    Sig: Take 1 tablet (80 mg total) by mouth at bedtime.    Dispense:  90 tablet    Refill:  1  . PHENobarbital (LUMINAL) 97.2 MG tablet    Sig: Take 1 tablet (97.2 mg total) by mouth at  bedtime.    Dispense:  30 tablet    Refill:  0     Follow-up: Return if symptoms worsen or fail to improve.  Scarlette Calico, MD

## 2015-02-05 ENCOUNTER — Ambulatory Visit (INDEPENDENT_AMBULATORY_CARE_PROVIDER_SITE_OTHER): Payer: PPO | Admitting: Neurology

## 2015-02-05 ENCOUNTER — Encounter: Payer: Self-pay | Admitting: Neurology

## 2015-02-05 VITALS — BP 132/70 | HR 78 | Resp 16 | Ht 64.5 in | Wt 224.5 lb

## 2015-02-05 DIAGNOSIS — G40309 Generalized idiopathic epilepsy and epileptic syndromes, not intractable, without status epilepticus: Secondary | ICD-10-CM

## 2015-02-05 DIAGNOSIS — G40909 Epilepsy, unspecified, not intractable, without status epilepticus: Secondary | ICD-10-CM

## 2015-02-05 DIAGNOSIS — F0781 Postconcussional syndrome: Secondary | ICD-10-CM

## 2015-02-05 NOTE — Patient Instructions (Signed)
No change in seizure medication For the next week, I want you to have a complete brain vacation.  Just rest at home.  No reading, TV, or looking on the computer.   Limit use of ibuprofen.  No hydrocodone Meclizine as needed. Follow up in 4 to 6 weeks.

## 2015-02-05 NOTE — Progress Notes (Signed)
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  She is accompanied by her husband who provides some history.   UPDATE: Since she has been on long-term phenobarbital, she was advised to start calcium 500mg  and vitamin D 600 units twice daily.  A DEXA scan was performed and was negative.  She continues on phenobarbital 97.5mg  at bedtime.  She had a mechanical fall last Monday, where she slipped on a mat and hit the back of her head.  She says she passed afterwards for a few seconds.  Afterwards, she noted headache, spinning sensation, nausea and she vomited.  She went to the ED where CT of the head and cervical spine showed no acute findings.  She was discharged home with meclizine and hydrocodone.  Since she has been home, she still notes constant dull posterior headache.  She takes ibuprofen for it daily but not the hydrocodone.  The dizziness is improving.  She has been more emotional and was crying easily.  Over the weekend, she noted asymmetry of her pupils, but that has since resolved.  HISTORY: Her son has fragile X syndrome with severe autism.  About 20 years ago, she began experiencing dizzy spells.  She apparently had imaging of the head which was suspicious for possible multiple sclerosis.  She had lumbar puncture, which was negative.  About 10 years ago, she developed mild tremor and unsteadiness.  She was worked up by Dr. Hazle Nordmann, a fragile X specialist at [facility], who diagnosed her with fragile X-associated tremor ataxia syndrome.  Work up included genetic testing for FMR1 gene premutation.   She has had a progressive course over the years.  She will have dizzy spells, described as lightheadedness, which will cause  falls.  She has fractured her wrists and leg.  She also has tremor of the hands, causing difficulty with writing.  She also developed dysphonia and was found to have tremor involving her "voice box".  She reports prior episodes of both bowel and bladder incontinence, which are rare.  She also reports poor depth perception.  For example, she may sometimes drive too close to the curb or she may accidentally bump into somebody when walking through the grocery store aisle.  She reports increased fatigue and weight gain.  She has depression and mood swings.  She also reports short-term memory problems.  She also has problems with swallowing and has choked on some occasions, requiring the Heimlich maneuver.  She has a remote history of epilepsy.  She had convulsions as a baby and two generalized tonic-clonic seizures in her early twenties.  She has been on phenobarbital ever since.  No recurrent seizures.  She also has history of migraine, described as severe pressure and throbbing, over her left eye.  In March 2015, she had such a headache associated with right sided facial numbness and lower facial weakness.  She was admitted to [facility] for TIA.  CT of the head was unremarkable.  MRI of the brain showed periventricular and subcortical hyperintensities.  MRA of the head showed no major intracranial arterial stenosis, however there is question of mild narrowing of the left cavernous ICA.  Carotid duplex showed 1-39% bilateral ICA stenosis.  2D echo showed LVEF of 60-65% with moderate LVH.  Hgb A1c was 6.5 and LDL was 85.  PAST MEDICAL HISTORY: Past Medical History  Diagnosis Date  . Seizures   . Diabetes mellitus without complication   . Fragile X syndrome     ataxia syndrome.    [name] Hypertension   . Hemorrhoids   . Transient cerebral ischemia   . Vertigo, benign paroxysmal   . MS (multiple sclerosis)   . Cerebellar ataxia   . Stroke     MEDICATIONS: Current Outpatient Prescriptions on File Prior  to Visit  Medication Sig Dispense Refill  . aspirin EC 81 MG EC tablet Take 1 tablet (81 mg total) by mouth daily.    [name] atorvastatin (LIPITOR) 80 MG tablet Take 1 tablet (80 mg total) by mouth at bedtime. 90 tablet 1  . Besifloxacin HCl (BESIVANCE) 0.6 % SUSP Place 1 drop into the left eye 3 (three) times daily.    [name] BREO ELLIPTA 100-25 MCG/INH AEPB Inhale 1 puff into the lungs daily.   11  . Bromfenac Sodium (PROLENSA) 0.07 % SOLN Place 1 drop into the left eye at bedtime.    . Calcium-Vitamin D 600-200 MG-UNIT per tablet Take 1 tablet by mouth 2 (two) times daily.    . cetirizine (ZYRTEC) 10 MG tablet Take 10 mg by mouth daily.  5  . clonazePAM (KLONOPIN) 1 MG tablet Take 1 tablet (1 mg total) by mouth 3 (three) times daily as needed for anxiety. 90 tablet 0  . [employer] CALCIUM 600+D 600-800 MG-UNIT TABS Take 1 tablet by mouth 2 (two) times daily with a meal.  3  . DULoxetine (CYMBALTA) 30 MG capsule Take 1 capsule (30 mg total) by mouth daily. 30 capsule 3  . HYDROcodone-acetaminophen (NORCO/VICODIN) 5-325 MG per tablet Take 1 tablet by mouth every 4 (four) hours as needed for severe pain. 10 tablet 0  . meclizine (ANTIVERT) 25 MG tablet Take 1 tablet (25 mg total) by mouth 3 (three) times daily as needed for dizziness. 30 tablet 0  . metoprolol succinate (TOPROL-XL) 25 MG 24 hr tablet Take 25 mg by mouth at bedtime.    . ondansetron (ZOFRAN ODT) 4 MG disintegrating tablet Take 1 tablet (4 mg total) by mouth every 8 (eight) hours as needed for nausea or vomiting. 20 tablet 0  . pantoprazole (PROTONIX) 40 MG tablet Take 1 tablet (40 mg total) by mouth 2 (two) times daily. 60 tablet 3  . PHENobarbital (LUMINAL) 97.2 MG tablet Take 1 tablet (97.2 mg total) by mouth at bedtime. 30 tablet 0  . ranitidine (ZANTAC) 150 MG tablet Take 150 mg by mouth 2 (two) times daily as needed (for indigestion).     [name] tetrahydrozoline (VISINE) 0.05 % ophthalmic solution Place 2 drops into both eyes 5 (five) times daily  as needed (for dry eyes).    . Vitamin D, Ergocalciferol, (DRISDOL) 50000 UNITS CAPS capsule Take 50,000 Units by mouth once a week. Take on Fridays     No current facility-administered medications on file prior to visit.    ALLERGIES: Allergies  Allergen Reactions  . Buprenorphine Hcl Hives and Itching  . Meperidine Hives and Itching    Other reaction(s): HIVES,ITCHING  . Morphine And Related Hives and Itching  . Pentazocine Anaphylaxis  . Pentazocine Lactate Anaphylaxis  . Phenytoin Hives    Other reaction(s): RASH  . Demerol Hives and Itching  . Dilaudid [Hydromorphone Hcl]     Hallucinations and felt crazy   . Doxycycline Hyclate     Other  reaction(s): GI UPSET,NAUSEA,VOMITING  . Hydromorphone     Altered mental status  . Ibuprofen     Contraindicated for risk of GI bleeding.  [name] Phenytoin Sodium Extended Hives  . Codeine Rash    When mixed with Demerol Patient reports no reaction with Codeine/APAP (Tylenol #3)    FAMILY HISTORY: Family History  Problem Relation Age of Onset  . Stroke    . Fragile X syndrome Son     Patient is carrier  . Raynaud syndrome Mother   . Alzheimer's disease Father   . Diabetes Father   . Diabetes Sister   . Cancer Brother     brother  . Cancer Maternal Grandmother     cervical & breast   . Heart failure Maternal Grandfather   . Diabetes Paternal Grandmother     SOCIAL HISTORY: Social History   Social History  . Marital Status: Married    Spouse Name: N/A  . Number of Children: N/A  . Years of Education: N/A   Occupational History  . Not on file.   Social History Main Topics  . Smoking status: Never Smoker   . Smokeless tobacco: Never Used  . Alcohol Use: No  . Drug Use: No  . Sexual Activity:    Partners: Male   Other Topics Concern  . Not on file   Social History Narrative    REVIEW OF SYSTEMS: Constitutional: No fevers, chills, or sweats, no generalized fatigue, change in appetite Eyes: No visual changes,  double vision, eye pain Ear, nose and throat: No hearing loss, ear pain, nasal congestion, sore throat Cardiovascular: No chest pain, palpitations Respiratory:  No shortness of breath at rest or with exertion, wheezes GastrointestinaI: No nausea, vomiting, diarrhea, abdominal pain, fecal incontinence Genitourinary:  No dysuria, urinary retention or frequency Musculoskeletal:  No neck pain, back pain Integumentary: No rash, pruritus, skin lesions Neurological: as above Psychiatric: No depression, insomnia, anxiety Endocrine: No palpitations, fatigue, diaphoresis, mood swings, change in appetite, change in weight, increased thirst Hematologic/Lymphatic:  No anemia, purpura, petechiae. Allergic/Immunologic: no itchy/runny eyes, nasal congestion, recent allergic reactions, rashes  PHYSICAL EXAM: Filed Vitals:   02/05/15 0845  BP: 132/70  Pulse: 78  Resp: 16   General: No acute distress.  Patient appears well-groomed.   Head:  Normocephalic/atraumatic Eyes:  Fundoscopic exam unremarkable without vessel changes, exudates, hemorrhages or papilledema. Neck: supple, no paraspinal tenderness, full range of motion Heart:  Regular rate and rhythm Lungs:  Clear to auscultation bilaterally Back: No paraspinal tenderness Neurological Exam: alert and oriented to person, place, and time. Attention span and concentration intact, recent and remote memory intact, fund of knowledge intact.  Speech fluent and not dysarthric, language intact.  CN II-XII intact. Fundoscopic exam unremarkable without vessel changes, exudates, hemorrhages or papilledema.  Bulk and tone normal, muscle strength 5/5 throughout.  Sensation to light touch, temperature and vibration intact.  Deep tendon reflexes 2+ throughout, toes downgoing.  Fine postural and kinetic tremor in upper extremities.  No dysmetria with finger to nose or heel to shin.  Wide-based gait, stumbles.  Unable to tandem walk.  Romberg with  sway.  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  She should rest at home, no reading, TV, exercise or using computer. Try to limit use of ibuprofen to no more than 2 days out of the week if possible.  Meclizine  as needed.  Stop hydrocodone Follow up in 4 to 6 weeks.  Metta Clines, DO  CC:  Vertell Novak, MD

## 2015-02-12 ENCOUNTER — Telehealth: Payer: Self-pay | Admitting: *Deleted

## 2015-02-12 NOTE — Telephone Encounter (Signed)
Patient called stating she is having some headache during the night and vertigo she is asking if this is common for a postconcussion . I advised her that yes this can go on for up to 6 weeks .

## 2015-02-13 ENCOUNTER — Telehealth: Payer: Self-pay | Admitting: Neurology

## 2015-02-13 NOTE — Telephone Encounter (Signed)
Pt called and said she was having diarrhea and wanted to know if the medicine would cause that/dawn CB# [phone]

## 2015-02-14 ENCOUNTER — Telehealth: Payer: Self-pay | Admitting: Neurology

## 2015-02-14 NOTE — Telephone Encounter (Signed)
Patient states she has had diarrhea 12 times in the past 24 hours I advised her to contact her  PCP . She states her family just do not understand she cannot sweep because she gets dizzy I explained to her that again it can take several months to get over a concussion

## 2015-02-14 NOTE — Telephone Encounter (Signed)
Pt's husband Tiffany Kocher called and had some questions about his wife for Dr Sophronia Simas CB# [phone]

## 2015-02-16 ENCOUNTER — Telehealth: Payer: Self-pay | Admitting: Family Medicine

## 2015-02-16 DIAGNOSIS — R42 Dizziness and giddiness: Secondary | ICD-10-CM

## 2015-02-16 MED ORDER — SCOPOLAMINE 1 MG/3DAYS TD PT72: 1.0000 | MEDICATED_PATCH | TRANSDERMAL | Status: DC

## 2015-02-16 NOTE — Telephone Encounter (Signed)
I was speaking with her about Rx for her dizziness. She is going to try the Trans-derm scope patches. She wanted to make you aware that she has had clear fluid coming from her nose & ear off & on for the past few weeks. She states there is no pain. She was questioning if she should have an mri since she feels like these sxs aren't normal. She also states that with her MS dx Dr. Gaynell Face who is a family friend told her that she should have an MRI of the brain. Please advise.

## 2015-02-16 NOTE — Telephone Encounter (Signed)
Please review

## 2015-02-16 NOTE — Telephone Encounter (Signed)
I spoke with patient. She is agreeable to trying the Trans-derm scope patches. She will d/c the meclizine. Rx sent to her pharmacy.

## 2015-02-16 NOTE — Telephone Encounter (Signed)
Review.

## 2015-02-16 NOTE — Telephone Encounter (Signed)
Pt's husband Vira Agar called in regards to her prescription for Antivert, can the dosage be increased she is still having dizziness issues/Dawn CB# [phone]

## 2015-02-16 NOTE — Telephone Encounter (Signed)
Lmovm to return my call. 

## 2015-02-16 NOTE — Telephone Encounter (Signed)
We can order her an MRI of the brain with and without contrast, but if she has been having clear fluid coming out of her nose, then she should go to the ED because that may be a CSF leak.

## 2015-02-16 NOTE — Telephone Encounter (Signed)
Actually, going back over her MRI of the brain and notes, I don't think she has MS.  Her symptoms are consistent with concussion, so I don't believe an MRI is warranted.  The CT of the head was most important, which was already performed.  If she is having clear fluid from the ears and nose, she should either be evaluated in the ED or by an ear, nose and throat specialist.

## 2015-02-16 NOTE — Telephone Encounter (Signed)
Pt called for a refill of med "Antivert?" /wants to double the dose if possible/ Call back @ [phone]

## 2015-02-16 NOTE — Telephone Encounter (Signed)
I would not increase meclizine.  We can discontinue meclizine and instead try a scopolamine patch.  It's what they give people before they go on a cruise,  She just puts it on her skin and she can leave it there for 3 days.  She should just not touch her eyes, because it will make her pupils large.  She can shower with it.  We can give her two patches and she can change it in 3 days.

## 2015-02-16 NOTE — Telephone Encounter (Signed)
Noted  

## 2015-02-16 NOTE — Telephone Encounter (Signed)
[place] for Vestibular Therapy??   Patient returned my call. I notified her of all advisement. She state she already has an ENT in [facility] she will call their office to set-up an appt. She also had questions about possible therapy for her vertigo. I did tell her about [facility] therapy at [facility]. She would like to try that to see if it will help with her sxs. I told her I would send the order for that and the therapy office will call her to set-up an appt.

## 2015-02-19 ENCOUNTER — Telehealth: Payer: Self-pay | Admitting: Neurology

## 2015-02-19 NOTE — Telephone Encounter (Signed)
Yes.  OK for vestibular rehab

## 2015-02-19 NOTE — Telephone Encounter (Signed)
Brandi [phone]/ Phy.Theropist/ [place], [place]/has questions about pt's therapy

## 2015-02-19 NOTE — Addendum Note (Signed)
Addended by: Thurmon Fair on: 02/19/2015 08:19 AM   Modules accepted: Orders

## 2015-02-19 NOTE — Telephone Encounter (Signed)
I left a message for this patient' s PT Delrae Alfred to call me back

## 2015-02-20 ENCOUNTER — Telehealth: Payer: Self-pay | Admitting: *Deleted

## 2015-02-20 NOTE — Telephone Encounter (Signed)
Patient wanting your opinion on therapist for vertigo. She has a name of someone in [place]   [phone]

## 2015-02-21 ENCOUNTER — Telehealth: Payer: Self-pay | Admitting: Neurology

## 2015-02-21 DIAGNOSIS — F0781 Postconcussional syndrome: Secondary | ICD-10-CM

## 2015-02-21 NOTE — Telephone Encounter (Signed)
Pt returned your call/ call back @ [phone]

## 2015-02-21 NOTE — Telephone Encounter (Signed)
Pt called back/ questioning weather or not she should do her therapy?/ call back @ [phone]

## 2015-02-22 NOTE — Telephone Encounter (Signed)
I spoke with patient we will ref her to Dr Tamala Julian for  Post concussion  She is ok with this

## 2015-02-23 ENCOUNTER — Telehealth: Payer: Self-pay | Admitting: Family Medicine

## 2015-02-23 NOTE — Telephone Encounter (Signed)
Dr. [name] sent a referral for patient to see you. She fell on her had on a ceramic floor and they are concerned that she may have concussion. I couldn't get her in till 9/21.  Not sure if you think she may need to be seen sooner

## 2015-02-28 NOTE — Telephone Encounter (Signed)
Spoke to pt, scheduled her w/ dr Tamala Julian for 9.9.16 @ 1030a.

## 2015-03-02 ENCOUNTER — Encounter: Payer: Self-pay | Admitting: Family Medicine

## 2015-03-02 ENCOUNTER — Ambulatory Visit (INDEPENDENT_AMBULATORY_CARE_PROVIDER_SITE_OTHER): Payer: PPO | Admitting: Family Medicine

## 2015-03-02 VITALS — BP 132/80 | HR 84

## 2015-03-02 DIAGNOSIS — F0781 Postconcussional syndrome: Secondary | ICD-10-CM | POA: Diagnosis not present

## 2015-03-02 NOTE — Patient Instructions (Signed)
Good to see you.  Good to see you I do think you have a concussion.  I would like you to limit screen time (including your phone) to 90 minutes daily for next week.  I want you to be active.  Start with walking outside daily donw the street and back and add a little distance daily.  In addition to this I recommend......  To help improve COGNITIVE function: Using fish oil/omega 3 that is 1000 mg (or roughly 600 mg EPA/DHA), starting as soon as possible after concussion, take: 3 tabs THREE TIMES a day  for the first 3 days, then (you will smell a little, sory) 3 tabs TWICE DAILY  for the next 3 days, then 3 tabs ONCE DAILY  for the next 10 days   To help reduce HEADACHES: Coenzyme Q10 160mg  ONCE DAILY Riboflavin/Vitamin B2 400mg  ONCE DAILY Magnesium oxide 400mg  ONCE - TWICE DAILY May stop after headaches are resolved.                                                                                             To help with INSOMNIA: Melatonin 3-5mg  AT BEDTIME     Other medicines to help decrease inflammation Alpha Lipoic Acid 100mg  TWICE DAILY Turmeric 500mg  twice daily Iron 65mg  elemental daily Vitamin D 4000 IU daily for 2 weeks then 2000 IU daily thereafter.   I want to see you again in 2 weeks.

## 2015-03-02 NOTE — Assessment & Plan Note (Signed)
I do believe the patient does have more of a postconcussive syndrome. We discussed icing regimen and home exercises. We discussed which activities doing which ones to potentially avoid. Patient will make these different changes and come back and see me again in 2 weeks. Please see patient instructions for greater detail.

## 2015-03-02 NOTE — Progress Notes (Signed)
[name] [place] [address], [place] [address] Phone: [phone] Subjective:    I'm seeing this patient by the request  of:  [name] MD  CC: Post concussion.   Brandi Ramsey is a 64 y.o. female coming in with complaint of  Date of concussion: 01/29/2015 LOC at time of injury? Unknown likely not She was initially seen in the emergency department for contusion on her head. Patient had a CT of the cervical spine as well as a CT of the head at that time. These were reviewed by me. Patient did have some cervical spine spondylosis but otherwise fairly unremarkable. Patient states since that time though unfortunately she continued to have significant headaches, photophobia, as well as difficulty with dizziness. Patient followed up with primary care provider and was referred to neurology for further evaluation and treatment. Patient was to take certain medications as well as to decrease the amount of stimulation in her daily activities which he found difficult. Patient states that with time and seems to be improving slowly but continues to have the dizziness. Patient has tried different medications do not seem to be helping that portion of it. Continues to have the headache of a daily basis. Having difficulty with insomnia. Denies any radiation in the arms or any numbness or tingling. States that her neck feels tight. Patient states that the anxiety and stress that this is causing that she does not feel like herself is worse than the headaches themselves. Patient denies any visual changes. 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 Patient would like to get back to this as soon as possible.  Past medical history significant for a transient ischemic attack as well as depression and migraines with remote history of epilepsy   Past Medical History  Diagnosis Date  . Seizures   . Diabetes mellitus without complication   . Fragile  X syndrome     ataxia syndrome.    [name] Hypertension   . Hemorrhoids   . Transient cerebral ischemia   . Vertigo, benign paroxysmal   . MS (multiple sclerosis)   . Cerebellar ataxia   . Stroke    Past Surgical History  Procedure Laterality Date  . Abdominal hysterectomy    . Fracture surgery      tib/fib (R) leg   Social History  Substance Use Topics  . Smoking status: Never Smoker   . Smokeless tobacco: Never Used  . Alcohol Use: No   Allergies  Allergen Reactions  . Buprenorphine Hcl Hives and Itching  . Meperidine Hives and Itching    Other reaction(s): HIVES,ITCHING  . Morphine And Related Hives and Itching  . Pentazocine Anaphylaxis  . Pentazocine Lactate Anaphylaxis  . Phenytoin Hives    Other reaction(s): RASH  . Demerol Hives and Itching  . Dilaudid [Hydromorphone Hcl]     Hallucinations and felt crazy   . Doxycycline Hyclate     Other reaction(s): GI UPSET,NAUSEA,VOMITING  . Hydromorphone     Altered mental status  . Ibuprofen     Contraindicated for risk of GI bleeding.  [name] Phenytoin Sodium Extended Hives  . Codeine Rash    When mixed with Demerol Patient reports no reaction with Codeine/APAP (Tylenol #3)   Family History  Problem Relation Age of Onset  . Stroke    . Fragile X syndrome Son     Patient is carrier  . Raynaud syndrome Mother   . Alzheimer's  disease Father   . Diabetes Father   . Diabetes Sister   . Cancer Brother     brother  . Cancer Maternal Grandmother     cervical & breast   . Heart failure Maternal Grandfather   . Diabetes Paternal Grandmother        Past medical history, social, surgical and family history all reviewed in electronic medical record.   Review of Systems: No headache, visual changes, nausea, vomiting, diarrhea, constipation, dizziness, abdominal pain, skin rash, fevers, chills, night sweats, weight loss, swollen lymph nodes, body aches, joint swelling, muscle aches, chest pain, shortness of breath, mood  changes.   Objective Blood pressure 132/80, pulse 84, SpO2 98 %.  General: No apparent distress alert and oriented x3 mood and affect normal, dressed appropriately.  HEENT: Pupils equal, extraocular movements intact  Respiratory: Patient's speak in full sentences and does not appear short of breath  Cardiovascular: No lower extremity edema, non tender, no erythema  Skin: Warm dry intact with no signs of infection or rash on extremities or on axial skeleton.  Abdomen: Soft nontender  Neuro: Cranial nerves II through XII are intact, neurovascularly intact in all extremities with 2+ DTRs and 2+ pulses.  Lymph: No lymphadenopathy of posterior or anterior cervical chain or axillae bilaterally.  Gait normal with good balance and coordination.  MSK:  Non tender with full range of motion and good stability and symmetric strength and tone of shoulders, elbows, wrist, hip, knee and ankles bilaterally.  Neck: Inspection unremarkable. No palpable stepoffs. Negative Spurling's maneuver. Full neck range of motion Grip strength and sensation normal in bilateral hands Strength good C4 to T1 distribution No sensory change to C4 to T1 Negative Hoffman sign bilaterally Reflexes normal Neck exam :  Symptom score? (22)      18 Severity score ? (132)    100   Oirentation?(5)   5 M / D/ W / Y /  Time  Immediate memory ?  5 pt each series Cat  ..........Marland KitchenCandle .... Baby Apple ........Marland KitchenPaper...... .Monkey Orange...Marland KitchenMarland KitchenMarland Kitchen Sugar..... Perfume Ball...........[name] Wagon ... [employer]..........[name]  Insect ..... Iron     Trial #1. (5)  5      Trial #2  (5)  4      Trial #3  (5)  3   TOTAL points: (15)  12       Concentration ? (5) 5 Digits backward: 1 point each series  2-5-3          (3-5-2) 7-1-6-4       (4-6-1-7)  4-0-3-8        (8-3-0-4) 2-5-3-8-0     (0-8-3-5-2)  Reverse months (1) 1  Balance (maximum is 10 errors per stance) Non -Dominant foot is         Unable to do  (Test with non dominant  foot, for 20 sec each, record # errors and add)     Co-ordination score (1) 1  5 correct FNF repositioning in < 4 seconds = 1  Delayed recall: ? (5)   4     Impression and Recommendations:     This case required medical decision making of moderate complexity.

## 2015-03-09 ENCOUNTER — Encounter: Payer: Self-pay | Admitting: Neurology

## 2015-03-09 ENCOUNTER — Ambulatory Visit (INDEPENDENT_AMBULATORY_CARE_PROVIDER_SITE_OTHER): Payer: PPO | Admitting: Neurology

## 2015-03-09 VITALS — BP 124/76 | HR 82 | Temp 97.9°F | Resp 16 | Ht 64.5 in | Wt 223.9 lb

## 2015-03-09 DIAGNOSIS — G40309 Generalized idiopathic epilepsy and epileptic syndromes, not intractable, without status epilepticus: Secondary | ICD-10-CM

## 2015-03-09 DIAGNOSIS — G35 Multiple sclerosis: Secondary | ICD-10-CM

## 2015-03-09 DIAGNOSIS — F0781 Postconcussional syndrome: Secondary | ICD-10-CM

## 2015-03-09 DIAGNOSIS — Q992 Fragile X chromosome: Secondary | ICD-10-CM | POA: Diagnosis not present

## 2015-03-09 DIAGNOSIS — G250 Essential tremor: Secondary | ICD-10-CM

## 2015-03-09 DIAGNOSIS — G119 Hereditary ataxia, unspecified: Secondary | ICD-10-CM

## 2015-03-09 DIAGNOSIS — R42 Dizziness and giddiness: Secondary | ICD-10-CM

## 2015-03-09 NOTE — Patient Instructions (Signed)
I think your symptoms are related to the concussion.  But to be sure there is nothing else going on, we will get MRI of the brain and cervical spine with and without contrast (since you have been told you may have MS).  Follow up in one month.  Follow up with Dr. Tamala Julian

## 2015-03-09 NOTE — Progress Notes (Signed)
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  She is accompanied by her husband who provides some history.   UPDATE: For postconcussion syndrome, she was referred to Dr. Hulan Ramsey of Sports Medicine.  She is currently taking melatonin, Co-Q-10 and fish oil.  She stopped Antivert and the Scopolamine patch.  She continues to have frequent brief vertigo spells lasting seconds throughout the day.  She experiences crying spells and irritability.  She continues to have headaches.  She has difficulty sleeping.  HISTORY: Her son has fragile X syndrome with severe autism.  About 20 years ago, she began experiencing dizzy spells.  She apparently had imaging of the head which was suspicious for possible multiple sclerosis.  She had lumbar puncture, which was negative.  About 10 years ago, she developed mild tremor and unsteadiness.  She was worked up by Dr. Hazle Ramsey, a fragile X specialist at [facility], who diagnosed her with fragile X-associated tremor ataxia syndrome.  Work up included genetic testing for FMR1 gene premutation.   She has had a progressive course over the years.  She will have dizzy spells, described as lightheadedness, which will cause falls.  She has fractured her wrists and leg.  She also has tremor of the hands, causing difficulty with writing.  She also developed dysphonia and was found to have tremor involving her "voice box".  She reports prior episodes of both bowel and bladder incontinence, which are rare.  She also reports poor depth perception.  For example, she may sometimes drive too close to the curb or she may accidentally bump into somebody when walking through the grocery store aisle.  She reports increased  fatigue and weight gain.  She has depression and mood swings.  She also reports short-term memory problems.  She also has problems with swallowing and has choked on some occasions, requiring the Heimlich maneuver.  She has a remote history of epilepsy.  She had convulsions as a baby and two generalized tonic-clonic seizures in her early twenties.  She has been on phenobarbital ever since.  No recurrent seizures.  She takes calcium 500mg  and vitamin D 600 units twice daily.  A DEXA scan was performed and was negative.  She continues on phenobarbital 97.5mg  at bedtime.  She also has history of migraine, described as severe pressure and throbbing, over her left eye.  In March 2015, she had such a headache associated with right sided facial numbness and lower facial weakness.  She was admitted to [facility] for TIA.  CT of the head was unremarkable.  MRI of the brain showed periventricular and subcortical hyperintensities.  MRA of the head showed no major intracranial arterial stenosis, however there is question of mild narrowing of the left cavernous ICA.  Carotid duplex showed 1-39% bilateral ICA stenosis.  2D echo showed LVEF of 60-65% with moderate LVH.  Hgb A1c was 6.5 and LDL was 85.  More acutely, she has suffered postconcussion syndrome since slipping and hitting the back of her head in August.  She says she passed afterwards for a few seconds.  Afterwards, she noted headache, spinning sensation, nausea and she vomited.  She went to the ED where CT of the head and cervical spine showed no acute findings.  She was  discharged home with meclizine and hydrocodone.  Since she has been home, she still notes constant dull posterior headache.  She takes ibuprofen for it daily but not the hydrocodone.  The dizziness is improving.  She has been more emotional and was crying easily.   PAST MEDICAL HISTORY: Past Medical History  Diagnosis Date  . Seizures   . Diabetes mellitus without complication   . Fragile X  syndrome     ataxia syndrome.    [name] Hypertension   . Hemorrhoids   . Transient cerebral ischemia   . Vertigo, benign paroxysmal   . MS (multiple sclerosis)   . Cerebellar ataxia   . Stroke     MEDICATIONS: Current Outpatient Prescriptions on File Prior to Visit  Medication Sig Dispense Refill  . aspirin EC 81 MG EC tablet Take 1 tablet (81 mg total) by mouth daily.    [name] atorvastatin (LIPITOR) 80 MG tablet Take 1 tablet (80 mg total) by mouth at bedtime. 90 tablet 1  . BREO ELLIPTA 100-25 MCG/INH AEPB Inhale 1 puff into the lungs daily.   11  . Calcium-Vitamin D 600-200 MG-UNIT per tablet Take 1 tablet by mouth 2 (two) times daily.    . clonazePAM (KLONOPIN) 1 MG tablet Take 1 tablet (1 mg total) by mouth 3 (three) times daily as needed for anxiety. 90 tablet 0  . [facility] CALCIUM 600+D 600-800 MG-UNIT TABS Take 1 tablet by mouth 2 (two) times daily with a meal.  3  . DULoxetine (CYMBALTA) 30 MG capsule Take 1 capsule (30 mg total) by mouth daily. 30 capsule 3  . metoprolol succinate (TOPROL-XL) 25 MG 24 hr tablet Take 25 mg by mouth at bedtime.    . pantoprazole (PROTONIX) 40 MG tablet Take 1 tablet (40 mg total) by mouth 2 (two) times daily. 60 tablet 3  . PHENobarbital (LUMINAL) 97.2 MG tablet Take 1 tablet (97.2 mg total) by mouth at bedtime. 30 tablet 0  . ranitidine (ZANTAC) 150 MG tablet Take 150 mg by mouth 2 (two) times daily as needed (for indigestion).     [name] tetrahydrozoline (VISINE) 0.05 % ophthalmic solution Place 2 drops into both eyes 5 (five) times daily as needed (for dry eyes).    . Vitamin D, Ergocalciferol, (DRISDOL) 50000 UNITS CAPS capsule Take 50,000 Units by mouth once a week. Take on Fridays    . Besifloxacin HCl (BESIVANCE) 0.6 % SUSP Place 1 drop into the left eye 3 (three) times daily.    . Bromfenac Sodium (PROLENSA) 0.07 % SOLN Place 1 drop into the left eye at bedtime.    . cetirizine (ZYRTEC) 10 MG tablet Take 10 mg by mouth daily.  5  .  HYDROcodone-acetaminophen (NORCO/VICODIN) 5-325 MG per tablet Take 1 tablet by mouth every 4 (four) hours as needed for severe pain. (Patient not taking: Reported on 03/09/2015) 10 tablet 0  . meclizine (ANTIVERT) 25 MG tablet Take 1 tablet (25 mg total) by mouth 3 (three) times daily as needed for dizziness. (Patient not taking: Reported on 03/09/2015) 30 tablet 0  . ondansetron (ZOFRAN ODT) 4 MG disintegrating tablet Take 1 tablet (4 mg total) by mouth every 8 (eight) hours as needed for nausea or vomiting. (Patient not taking: Reported on 03/09/2015) 20 tablet 0  . scopolamine (TRANSDERM-SCOP) 1 MG/3DAYS Place 1 patch (1.5 mg total) onto the skin every 3 (three) days. (Patient not taking: Reported on 03/09/2015) 10 patch 0   No current facility-administered medications on file prior to  visit.    ALLERGIES: Allergies  Allergen Reactions  . Buprenorphine Hcl Hives and Itching  . Meperidine Hives and Itching    Other reaction(s): HIVES,ITCHING  . Morphine And Related Hives and Itching  . Pentazocine Anaphylaxis  . Pentazocine Lactate Anaphylaxis  . Phenytoin Hives    Other reaction(s): RASH  . Demerol Hives and Itching  . Dilaudid [Hydromorphone Hcl]     Hallucinations and felt crazy   . Doxycycline Hyclate     Other reaction(s): GI UPSET,NAUSEA,VOMITING  . Hydromorphone     Altered mental status  . Ibuprofen     Contraindicated for risk of GI bleeding.  [name] Phenytoin Sodium Extended Hives  . Codeine Rash    When mixed with Demerol Patient reports no reaction with Codeine/APAP (Tylenol #3)    FAMILY HISTORY: Family History  Problem Relation Age of Onset  . Stroke    . Fragile X syndrome Son     Patient is carrier  . Raynaud syndrome Mother   . Alzheimer's disease Father   . Diabetes Father   . Diabetes Sister   . Cancer Brother     brother  . Cancer Maternal Grandmother     cervical & breast   . Heart failure Maternal Grandfather   . Diabetes Paternal Grandmother      SOCIAL HISTORY: Social History   Social History  . Marital Status: Married    Spouse Name: N/A  . Number of Children: N/A  . Years of Education: N/A   Occupational History  . Not on file.   Social History Main Topics  . Smoking status: Never Smoker   . Smokeless tobacco: Never Used  . Alcohol Use: No  . Drug Use: No  . Sexual Activity:    Partners: Male   Other Topics Concern  . Not on file   Social History Narrative    REVIEW OF SYSTEMS: Constitutional: fatigue Eyes: No visual changes, double vision, eye pain Ear, nose and throat: No hearing loss, ear pain, nasal congestion, sore throat Cardiovascular: No chest pain, palpitations Respiratory:  No shortness of breath at rest or with exertion, wheezes GastrointestinaI: No nausea, vomiting, diarrhea, abdominal pain, fecal incontinence Genitourinary:  No dysuria, urinary retention or frequency Musculoskeletal:  No neck pain, back pain Integumentary: No rash, pruritus, skin lesions Neurological: as above Psychiatric: depression, insomnia, anxiety Endocrine: No palpitations, fatigue Hematologic/Lymphatic:  No anemia, purpura, petechiae. Allergic/Immunologic: no itchy/runny eyes, nasal congestion, recent allergic reactions, rashes  PHYSICAL EXAM: Filed Vitals:   03/09/15 0935  BP: 124/76  Pulse: 82  Temp: 97.9 F (36.6 C)  Resp: 16   General: anxious, tearful.  No acute distress.  Patient appears well-groomed.  Head:  Normocephalic/atraumatic Eyes:  Fundoscopic exam unremarkable without vessel changes, exudates, hemorrhages or papilledema. Neck: supple, no paraspinal tenderness, full range of motion Heart:  Regular rate and rhythm Lungs:  Clear to auscultation bilaterally Back: No paraspinal tenderness Neurological Exam: alert and oriented to person, place, and time. Attention span and concentration intact, recent and remote memory intact, fund of knowledge intact.  Speech fluent and not dysarthric, language  intact.  Nystagmus noted on tracking in all directions.  Otherwise, CN II-XII intact. Fundoscopic exam unremarkable without vessel changes, exudates, hemorrhages or papilledema.  Bulk and tone normal, muscle strength 5/5 throughout.  Sensation to light touch, temperature and vibration intact.  Deep tendon reflexes 2+ throughout, toes downgoing.  Finger to nose with dysmetria and tremor, mild dysmetria with left heel to right shin.  Gait ataxic, unable to tandem walk, Romberg positive.  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  She says she has history of MS, but I don't suspect this is an accurate diagnosis.  Although she has abnormal white matter on brain MRI in the past, cervical spine imaging and CSF were reportedly unremarkable.  She was never started on a disease-modifying agent.    I believe her symptoms are due to postconcussion syndrome, however we will check MRI of brain and cervical spine without and with contrast to evaluate for any demyelination given her reported past diagnosis of multiple sclerosis.   PLAN: 1.  MRI of brain and cervical spine without and with contrast 2.  Follow up with Dr. Tamala Julian as scheduled 3.  Follow up in 3 months.  21 minutes spent face to face with patient, over 50% spent discussing diagnosis and management.  Metta Clines, DO  CC:  Vertell Novak, MD

## 2015-03-14 ENCOUNTER — Ambulatory Visit (INDEPENDENT_AMBULATORY_CARE_PROVIDER_SITE_OTHER): Payer: PPO | Admitting: Family Medicine

## 2015-03-14 ENCOUNTER — Encounter: Payer: Self-pay | Admitting: Family Medicine

## 2015-03-14 VITALS — BP 126/84 | HR 95 | Ht 64.0 in

## 2015-03-14 DIAGNOSIS — F0781 Postconcussional syndrome: Secondary | ICD-10-CM | POA: Diagnosis not present

## 2015-03-14 MED ORDER — GABAPENTIN 100 MG PO CAPS: 200.0000 mg | ORAL_CAPSULE | Freq: Every day | ORAL | Status: DC

## 2015-03-14 NOTE — Patient Instructions (Signed)
Good to see you Continue the fish oil at 3 grams daily Continue the other vitamins Gabapentin 100mg  at nigh for next 5 days then 200mg  at night thereafter I am interested in the MRI and we can discuss.  Continue to increase activity as tolerated. Start 50% frequency and duration then add 10% a week.  See me again in 4 weeks.

## 2015-03-14 NOTE — Progress Notes (Signed)
Pre visit review using our clinic review tool, if applicable. No additional management support is needed unless otherwise documented below in the visit note. 

## 2015-03-14 NOTE — Progress Notes (Signed)
[name] [address] [place], [place] [address] Phone: [phone] Subjective:    I'm seeing this patient by the request  of:  [name] MD  CC: Post concussion.   [id number] Brandi Ramsey is a 64 y.o. female coming in with complaint of  Date of concussion: 01/29/2015  She was initially seen in the emergency department for contusion on her head. Patient had a CT of the cervical spine as well as a CT of the head at that time. These were reviewed by me. Patient did have some cervical spine spondylosis but otherwise fairly unremarkable.  Patient was seen by me and has been treated for more of a postconcussive syndrome for the last 2 weeks. Patient has been taking different medications as well as trying to limit some of her activity. Patient states she is doing approximately 50% better. Still has some difficulty when she moves her head to size. Still has some mild concentration problems. Otherwise she notices that she is able to do much more activity and she is noticing that her endurance is improving with concentration. No side effects to the medications and feels like they have helped out significantly.  Past medical history significant for a transient ischemic attack as well as depression and migraines with remote history of epilepsy   Past Medical History  Diagnosis Date  . Seizures   . Diabetes mellitus without complication   . Fragile X syndrome     ataxia syndrome.    [name] Hypertension   . Hemorrhoids   . Transient cerebral ischemia   . Vertigo, benign paroxysmal   . MS (multiple sclerosis)   . Cerebellar ataxia   . Stroke    Past Surgical History  Procedure Laterality Date  . Abdominal hysterectomy    . Fracture surgery      tib/fib (R) leg   Social History  Substance Use Topics  . Smoking status: Never Smoker   . Smokeless tobacco: Never Used  . Alcohol Use: No   Allergies  Allergen Reactions  . Buprenorphine Hcl Hives and Itching  .  Meperidine Hives and Itching    Other reaction(s): HIVES,ITCHING  . Morphine And Related Hives and Itching  . Pentazocine Anaphylaxis  . Pentazocine Lactate Anaphylaxis  . Phenytoin Hives    Other reaction(s): RASH  . Demerol Hives and Itching  . Dilaudid [Hydromorphone Hcl]     Hallucinations and felt crazy   . Doxycycline Hyclate     Other reaction(s): GI UPSET,NAUSEA,VOMITING  . Hydromorphone     Altered mental status  . Ibuprofen     Contraindicated for risk of GI bleeding.  [name] Phenytoin Sodium Extended Hives  . Codeine Rash    When mixed with Demerol Patient reports no reaction with Codeine/APAP (Tylenol #3)   Family History  Problem Relation Age of Onset  . Stroke    . Fragile X syndrome Son     Patient is carrier  . Raynaud syndrome Mother   . Alzheimer's disease Father   . Diabetes Father   . Diabetes Sister   . Cancer Brother     brother  . Cancer Maternal Grandmother     cervical & breast   . Heart failure Maternal Grandfather   . Diabetes Paternal Grandmother        Past medical history, social, surgical and family history all reviewed in electronic medical record.   Review of Systems: No headache, visual changes, nausea, vomiting, diarrhea, constipation, dizziness, abdominal pain,  skin rash, fevers, chills, night sweats, weight loss, swollen lymph nodes, body aches, joint swelling, muscle aches, chest pain, shortness of breath, mood changes.   Objective Blood pressure 126/84, pulse 95, height 5\' 4"  (1.626 m), SpO2 96 %.  General: No apparent distress alert and oriented x3 mood and affect normal, dressed appropriately.  HEENT: Pupils equal, extraocular movements intact  Respiratory: Patient's speak in full sentences and does not appear short of breath  Cardiovascular: No lower extremity edema, non tender, no erythema  Skin: Warm dry intact with no signs of infection or rash on extremities or on axial skeleton.  Abdomen: Soft nontender  Neuro: Cranial  nerves II through XII are intact, neurovascularly intact in all extremities with 2+ DTRs and 2+ pulses.  Lymph: No lymphadenopathy of posterior or anterior cervical chain or axillae bilaterally.  Gait normal with good balance and coordination.  MSK:  Non tender with full range of motion and good stability and symmetric strength and tone of shoulders, elbows, wrist, hip, knee and ankles bilaterally.  Neck: Inspection unremarkable. No palpable stepoffs. Negative Spurling's maneuver. Full neck range of motion Grip strength and sensation normal in bilateral hands Strength good C4 to T1 distribution No sensory change to C4 to T1 Negative Hoffman sign bilaterally Reflexes normal      Oirentation?(5)   5 M / D/ W / Y /  Time  Immediate memory ?  5 pt each series Cat  ..........Marland KitchenCandle .... Baby Apple ........Marland KitchenPaper...... .Monkey Orange...Marland KitchenMarland KitchenMarland Kitchen Sugar..... Perfume Ball...........[name] Wagon ... [employer]..........[name]  Insect ..... Iron     Trial #1. (5)  5      Trial #2  (5)  4      Trial #3  (5)  3   TOTAL points: (15)  12       Concentration ? (5) 5 Digits backward: 1 point each series  2-5-3          (3-5-2) 7-1-6-4       (4-6-1-7)  4-0-3-8        (8-3-0-4) 2-5-3-8-0     (0-8-3-5-2)  Reverse months (1) 1     Co-ordination score (1) 1  Delayed recall: ? (5)   5     Impression and Recommendations:     This case required medical decision making of moderate complexity.

## 2015-03-14 NOTE — Assessment & Plan Note (Signed)
Do believe the patient is improving significant pain. We discussed attrition of the medications off. We also discussed gabapentin and patient will try this medication to see if this will help with the chronic headaches as well as the sleep. Patient states that she is 100 MRI which I do think is a good idea especially with her still having symptoms with movement of the head and we should rule out any vascular compromise a could be contribute in. Patient's emotional lability is still somewhat of a concern but is improved. We'll continue to monitor and patient will continue to follow every 4 weeks.  Spent  25 minutes with patient face-to-face and had greater than 50% of counseling including as described above in assessment and plan.

## 2015-03-15 ENCOUNTER — Ambulatory Visit
Admission: RE | Admit: 2015-03-15 | Discharge: 2015-03-15 | Disposition: A | Discharge status: Home / Self Care | Payer: PPO | Source: Ambulatory Visit | Attending: Neurology | Admitting: Neurology

## 2015-03-15 MED ORDER — GADOBENATE DIMEGLUMINE 529 MG/ML IV SOLN
20.0000 mL | Freq: Once | INTRAVENOUS | Status: AC | PRN
  Administered 2015-03-15: 20 mL via INTRAVENOUS

## 2015-03-16 ENCOUNTER — Telehealth: Payer: Self-pay | Admitting: Neurology

## 2015-03-16 NOTE — Telephone Encounter (Signed)
Pt called and said she had an MRI last night and the technician said she should call her doctor/Dawn CB# [phone]

## 2015-03-16 NOTE — Telephone Encounter (Signed)
Spoke with pt. Pt. Advised of results and verbalized understanding. Will cb with any further questions or concerns. [facility]

## 2015-03-16 NOTE — Telephone Encounter (Signed)
-----   Message from Pieter Partridge, DO sent at 03/16/2015  7:05 AM EDT ----- MRI of the brain and cervical spine show nothing alarming.  The brain shows changes that can be expected in somebody with history of diabetes and high cholesterol.

## 2015-03-19 ENCOUNTER — Encounter: Payer: Self-pay | Admitting: Family Medicine

## 2015-03-21 ENCOUNTER — Other Ambulatory Visit: Payer: PPO

## 2015-03-23 ENCOUNTER — Telehealth: Payer: Self-pay | Admitting: *Deleted

## 2015-03-23 ENCOUNTER — Other Ambulatory Visit: Payer: Self-pay | Admitting: Family

## 2015-03-23 DIAGNOSIS — G40309 Generalized idiopathic epilepsy and epileptic syndromes, not intractable, without status epilepticus: Secondary | ICD-10-CM

## 2015-03-23 MED ORDER — PHENOBARBITAL 97.2 MG PO TABS: 97.2000 mg | ORAL_TABLET | Freq: Every day | ORAL | Status: DC

## 2015-03-23 MED ORDER — METOPROLOL SUCCINATE ER 25 MG PO TB24: 25.0000 mg | ORAL_TABLET | Freq: Every day | ORAL | Status: DC

## 2015-03-23 NOTE — Telephone Encounter (Signed)
Left msg on triage stating needing refills [facility] her Met[facility]ol and Phenobarbital. Sent metoprolol pls advise on phenobarbital..../lmb

## 2015-03-23 NOTE — Telephone Encounter (Signed)
She will need to call Dr. Georgie Chard office for the phenobarbital.

## 2015-03-23 NOTE — Telephone Encounter (Signed)
Printed and signed 1 month supply, please fax. Further to come per Jones.

## 2015-03-23 NOTE — Telephone Encounter (Signed)
Notified pt with md response. Pt states when she saw Dr. Ronnald Ramp on 8/11, he stated that he would take over filling the phenobarbital. He refill it on that day.m she states she can't go without taking med. Only have 1 pill for tonight...Johny Chess

## 2015-03-23 NOTE — Addendum Note (Signed)
Addended by: Pricilla Holm A on: 03/23/2015 01:22 PM   Modules accepted: Orders

## 2015-03-23 NOTE — Telephone Encounter (Signed)
Notified pt md ok to fill phenobarbital rx [facility].../lmb

## 2015-03-28 ENCOUNTER — Ambulatory Visit: Payer: PPO | Attending: Neurology | Admitting: Rehabilitative and Restorative Service Providers"

## 2015-03-28 DIAGNOSIS — R269 Unspecified abnormalities of gait and mobility: Secondary | ICD-10-CM | POA: Insufficient documentation

## 2015-03-28 DIAGNOSIS — H8111 Benign paroxysmal vertigo, right ear: Secondary | ICD-10-CM | POA: Insufficient documentation

## 2015-03-28 DIAGNOSIS — F0781 Postconcussional syndrome: Secondary | ICD-10-CM | POA: Diagnosis present

## 2015-03-28 NOTE — Therapy (Signed)
[facility] [address] [place], [place], [address] Phone: [phone]   Fax:  [phone]  Physical Therapy Evaluation  Patient Details  Name: Brandi Ramsey MRN: [id number] Date of Birth: 05/23/1951 Referring Provider:  Hoyt Koch, *  Encounter Date: 03/28/2015      PT End of Session - 03/28/15 1138    Visit Number 1   Number of Visits 16   Date for PT Re-Evaluation 05/26/15   Authorization Type private insurance with $30 copay, no visit limit per epic notes   PT Start Time 0932   PT Stop Time 1020   PT Time Calculation (min) 48 min   Activity Tolerance Patient tolerated treatment well   Behavior During Therapy [facility] for tasks assessed/performed      Past Medical History  Diagnosis Date  . Seizures   . Diabetes mellitus without complication   . Fragile X syndrome     ataxia syndrome.    [name] Hypertension   . Hemorrhoids   . Transient cerebral ischemia   . Vertigo, benign paroxysmal   . MS (multiple sclerosis)   . Cerebellar ataxia   . Stroke     Past Surgical History  Procedure Laterality Date  . Abdominal hysterectomy    . Fracture surgery      tib/fib (R) leg    There were no vitals filed for this visit.  Visit Diagnosis:  BPPV (benign paroxysmal positional vertigo), right  Post concussion syndrome  Abnormality of gait      Subjective Assessment - 03/28/15 0935    Subjective The patient fell 01/29/2015 hitting her head sustaining a concussion with possible loss of consciousness.  Initially she experienced spinning sensation of vertigo that was constant.  At this time, she reports intermittent sensations of dizziness that are provoked by lying flat and while moving her head "it comes out of the blue".  The sensations last a few moments.   Patient is accompained by: Family member  son   Pertinent History She is s/p eye procedure a few weeks prior to a fall.   Patient Stated Goals Return  to driving and know I'm safe and improve balance.   Currently in Pain? Yes   Pain Score 0-No pain  initially constant, now intermittent   Pain Location Head   Pain Orientation --  frontal   Pain Descriptors / Indicators Aching   Pain Type Acute pain   Pain Onset More than a month ago   Pain Frequency Intermittent   Aggravating Factors  stress, light sensitive, loud noises   Pain Relieving Factors anti-inflammatories (vitamins) prescribed by Dr. Tonita Cong PT Assessment - 03/28/15 0941    Assessment   Medical Diagnosis concussion, vertigo   Onset Date/Surgical Date 01/29/15   Prior Therapy none   Precautions   Precautions Fall   Balance Screen   Has the patient fallen in the past 6 months Yes   How many times? 10   Has the patient had a decrease in activity level because of a fear of falling?  Yes  "I stumble all the time"   Is the patient reluctant to leave their home because of a fear of falling?  Yes   [place] Private residence   Living Arrangements Spouse/significant other   Type of [place] to enter   Entrance Stairs-Number of Steps --  1   Home Layout Two level;Able to live on main level with bedroom/bathroom   Home Equipment --  has single point cane   Prior Function   Level of Independence Independent  falls frequently   Cognition   Overall Cognitive Status Impaired/Different from baseline   Memory --  decreased short term memory since concussion            Vestibular Assessment - 03/28/15 0949    Vestibular Assessment   General Observation patient ambulates independently into clinic without a device   Occulomotor Exam   Occulomotor Alignment Normal   Spontaneous Absent   Gaze-induced Absent   Smooth Pursuits Intact   Saccades --  patient takes 2-3 movements to get to target, no overshootin   Comment double vision initially that has resolved   Vestibulo-Occular Reflex   VOR 1  Head Only (x 1 viewing) slow gaze - patient can maintain fixation on target; head thrust test positive to both sides- unable to maintain fixation at faster pace   Comment dizziness provoked with faster head turns    Positional Testing   Dix-Hallpike Dix-Hallpike Right;Dix-Hallpike Left   Sidelying Test --   Horizontal Canal Testing Horizontal Canal Right;Horizontal Canal Left   Dix-Hallpike Right   Dix-Hallpike Right Duration 20-30 seconds   Dix-Hallpike Right Symptoms Upbeat, right rotatory nystagmus   Dix-Hallpike Left   Dix-Hallpike Left Duration >60 seconds   Dix-Hallpike Left Symptoms Downbeat, right rotatory nystagmus   Horizontal Canal Right   Horizontal Canal Right Duration 10-20 seconds   Horizontal Canal Right Symptoms Ageotrophic   Horizontal Canal Left   Horizontal Canal Left Duration 10-20 seconds   Horizontal Canal Left Symptoms Ageotrophic          Vestibular Treatment/Exercise - 03/28/15 1405    Vestibular Treatment/Exercise   Vestibular Treatment Provided Canalith Repositioning   Canalith Repositioning Epley Manuever Right    EPLEY MANUEVER RIGHT   Number of Reps  1   Overall Response --  will reassess at first treatment session   Response Details  able to ambulate safely after canolith repositioning           PT Education - 03/28/15 1137    Education provided Yes   Education Details provided handout from [email or web address] for BPPV   Person(s) Educated Patient;Child(ren)   Methods Explanation;Handout   Comprehension Verbalized understanding          PT Short Term Goals - 03/28/15 1139    PT SHORT TERM GOAL #1   Title The patient will have negative bilateral dix hallpike testing (to demo resolution of R posterior canalithiasis and R anterior canalithiasis).   Baseline Target date 04/27/2015   Time 4   Period Weeks   PT SHORT TERM GOAL #2   Title The patient will be indep with HEP for gaze adaptation, habituation, and general mobility.   Baseline  Target date 04/27/2015   Time 4   Period Weeks   PT SHORT TERM GOAL #3   Title The patient will be further assessed for Berg balance test and goal to follow, as indicated.   Baseline Target date 04/27/2015   Time 4   Period Weeks   PT SHORT TERM GOAL #4   Title The patient will be further assessed for gait speed and goal to follow, as indicated.   Baseline Target date 04/27/2015   Time 4   Period Weeks   PT SHORT TERM GOAL #5   Title The patient will perform rolling R<>left  without nystagmus viewed or subjective reports of dizziness.   Baseline Target date 04/27/2015   Time 4   Period Weeks           PT Long Term Goals - 03/28/15 1141    PT LONG TERM GOAL #1   Title The patient will be indep with HEP progression for balance, gait and general motion tolerance.   Baseline Target date 05/26/2015   Time 8   Period Weeks   PT LONG TERM GOAL #2   Title Merrilee Jansky goal to follow   Baseline Target date 04/27/2015   Time 8   Period Weeks   PT LONG TERM GOAL #3   Title Gait speed goal to follow   Baseline Target date 04/27/2015   Time 8   Period Weeks   PT LONG TERM GOAL #4   Title The patient will subjectively report vertigo improved by >50% since initial evaluation.   Baseline Target date 04/27/2015   Time 8   Period Weeks               Plan - 03/28/15 1143    Clinical Impression Statement The patient is a 64 yo female presenting with multifactorial dizziness including post concussion and multi-canal BPPV. From today's positional testing, she appears to have R posterior canalithiasis, R anterior canalithiasis (could be cupulolithiasis because iniitial repetition nystagmus lasted >60 seconds), and horizontal cupulolithiasis.  PT initiated treatment for R posterior canal and will reassess next visit and treat next canal.   Pt will benefit from skilled therapeutic intervention in order to improve on the following deficits Abnormal gait;Decreased balance;Decreased mobility;Decreased  strength;Postural dysfunction;Decreased activity tolerance;Dizziness;Difficulty walking   Rehab Potential Good   PT Frequency 2x / week   PT Duration 8 weeks   PT Treatment/Interventions Vestibular;Canalith Repostioning;Balance training;Neuromuscular re-education;Gait training;DME Instruction;Patient/family education;Stair training;Functional mobility training;Therapeutic activities;Therapeutic exercise   PT Next Visit Plan Reassess multi-canal BPPV and treat, assess Berg and gait speed, provide HEP   Consulted and Agree with Plan of Care Patient;Family member/caregiver   Family Member Consulted Son present         Problem List Patient Active Problem List   Diagnosis Date Noted  . Postconcussive syndrome 03/02/2015  . Head injury 02/03/2015  . Fragile X associated tremor ataxia syndrome 10/10/2014  . Generalized epilepsy ([place]) 10/10/2014  . Dysphagia 10/10/2014  . Tremor 10/10/2014  . Fatigue 02/01/2014  . Accidental fall 02/01/2014  . RAD (reactive airway disease) 02/01/2014  . Microalbuminuria 02/01/2014  . Cataract 10/15/2013  . Acid reflux 09/13/2013  . Essential (primary) hypertension 09/13/2013  . Hypercholesteremia 09/13/2013  . Diabetes mellitus, type 2 ([place]) 09/13/2013  . TIA (transient ischemic attack) 08/27/2013  . Headache(784.0) 08/27/2013  . Avitaminosis D 04/06/2013  . Obesity 04/06/2013  . Allergic rhinitis 11/27/2012  . Anxiety state 11/27/2012  . Clinical depression 11/27/2012  . Bone/cartilage disorder 11/27/2012  . Fragile X syndrome 11/27/2012  . Absence of bladder continence 11/27/2012  . Arthralgia of hip or thigh 11/27/2012  . Cardiac conduction disorder 11/27/2012  . DS (disseminated sclerosis) ([place]) 11/27/2012    Thank you for the referral of this patient. Rudell Cobb, MPT  [place], PT 03/28/2015, 2:07 PM  [facility] [address] [place], [place],  [address] Phone: [phone]   Fax:  [phone]

## 2015-03-29 ENCOUNTER — Ambulatory Visit: Payer: PPO | Admitting: Rehabilitative and Restorative Service Providers"

## 2015-03-29 ENCOUNTER — Other Ambulatory Visit: Payer: Self-pay

## 2015-03-29 DIAGNOSIS — H8111 Benign paroxysmal vertigo, right ear: Secondary | ICD-10-CM | POA: Diagnosis not present

## 2015-03-29 DIAGNOSIS — R269 Unspecified abnormalities of gait and mobility: Secondary | ICD-10-CM

## 2015-03-29 DIAGNOSIS — F0781 Postconcussional syndrome: Secondary | ICD-10-CM

## 2015-03-29 MED ORDER — CLONAZEPAM 1 MG PO TABS: 1.0000 mg | ORAL_TABLET | Freq: Two times a day (BID) | ORAL | Status: DC | PRN

## 2015-03-29 NOTE — Telephone Encounter (Signed)
Ok to fill? Last OV 02/01/2015 PCP out office

## 2015-03-29 NOTE — Patient Instructions (Signed)
Tip Card 1.The goal of habituation training is to assist in decreasing symptoms of vertigo, dizziness, or nausea provoked by specific head and body motions. 2.These exercises may initially increase symptoms; however, be persistent and work through symptoms. With repetition and time, the exercises will assist in reducing or eliminating symptoms. 3.Exercises should be stopped and discussed with the therapist if you experience any of the following: - Sudden change or fluctuation in hearing - New onset of ringing in the ears, or increase in current intensity - Any fluid discharge from the ear - Severe pain in neck or back - Extreme nausea  Copyright  VHI. All rights reserved.  Rolling   With pillow under head, start on back. Roll to your right side.  Hold until dizziness stops, plus 20 seconds and then roll to the left side.  Hold until dizziness stops, plus 20 seconds.  Repeat sequence 5 times per session. Do 2 sessions per day.  Copyright  VHI. All rights reserved.  Sit to Side-Lying   Sit on edge of bed. Lie down onto the right side and hold until dizziness stops, plus 20 seconds.  Return to sitting and wait until dizziness stops, plus 20 seconds.  Repeat to the left side. Repeat sequence 5 times per session. Do 2 sessions per day.  Begin with one pillow and you can remove the pillow once you tolerate 5 repetitions without dizziness or nausea. Copyright  VHI. All rights reserved.   Special Instructions: Exercises may bring on mild to moderate symptoms of nausea, dizziness that resolve within 30 minutes of completing exercises. If symptoms are lasting longer than 30 minutes, modify your exercises by:  >decreasing the # of times you complete each activity >ensuring your symptoms return to baseline before moving onto the next exercise >dividing up exercises so you do not do them all in one session, but multiple short sessions throughout the day >doing them once a day until symptoms  improve

## 2015-03-29 NOTE — Telephone Encounter (Signed)
Last filled 01/11/15 so will fill for 45 pills which appears to be a 1 month supply.

## 2015-03-29 NOTE — Telephone Encounter (Signed)
Faxed script back to [facility].../lmb 

## 2015-03-29 NOTE — Therapy (Signed)
[facility] [address] [place], [place], [address] Phone: [phone]   Fax:  [phone]  Physical Therapy Treatment  Patient Details  Name: Brandi Ramsey MRN: [id number] Date of Birth: Jul 10, 1950 Referring Provider:  Janith Lima, MD  Encounter Date: 03/29/2015      PT End of Session - 03/29/15 1230    Visit Number 2   Number of Visits 16   Date for PT Re-Evaluation 05/26/15   Authorization Type private insurance with $30 copay, no visit limit per epic notes   PT Start Time 1105   PT Stop Time 1205   PT Time Calculation (min) 60 min   Activity Tolerance Other (comment)  initially rested due to nausea to allow symtpoms to settle before progressing   Behavior During Therapy [facility] for tasks assessed/performed      Past Medical History  Diagnosis Date  . Seizures   . Diabetes mellitus without complication   . Fragile X syndrome     ataxia syndrome.    [name] Hypertension   . Hemorrhoids   . Transient cerebral ischemia   . Vertigo, benign paroxysmal   . MS (multiple sclerosis)   . Cerebellar ataxia   . Stroke     Past Surgical History  Procedure Laterality Date  . Abdominal hysterectomy    . Fracture surgery      tib/fib (R) leg    There were no vitals filed for this visit.  Visit Diagnosis:  BPPV (benign paroxysmal positional vertigo), right  Post concussion syndrome  Abnormality of gait      Subjective Assessment - 03/29/15 1111    Subjective The patient reports nausea after getting home yesterday and fatigue.  She had to go to bed to recover from yesterday's session.  She also experienced small headache.   Patient Stated Goals Return to driving and know I'm safe and improve balance.   Currently in Pain? No/denies          Vestibular Assessment - 03/29/15 1113    Positional Testing   Dix-Hallpike Dix-Hallpike Right;Dix-Hallpike Left   Horizontal Canal Testing Horizontal Canal  Right;Horizontal Canal Left   Dix-Hallpike Right   Dix-Hallpike Right Symptoms No nystagmus   Dix-Hallpike Left   Dix-Hallpike Left Duration 5-10 seconds   Dix-Hallpike Left Symptoms --  a few beats that were hard to differentiate direction   Horizontal Canal Right   Horizontal Canal Right Duration 20-30 seconds   Horizontal Canal Right Symptoms --  upbeat, rotary            Vestibular Treatment/Exercise - 03/29/15 0001    Vestibular Treatment/Exercise   Vestibular Treatment Provided Canalith Repositioning;Habituation   Habituation Exercises Brandt Daroff;Horizontal Roll   [employer]   Number of Reps  4   Symptom Description  worse to the R side and described as a trace of dizziness   Horizontal Roll   Number of Reps  4   Symptom Description  worse to the right side with downbeat nystagmus noted one rep and                PT Education - 03/29/15 1229    Education provided Yes   Education Details HEP: habituation brandt daroff and rolling   Person(s) Educated Patient;Child(ren)   Methods Explanation;Demonstration;Handout   Comprehension Verbalized understanding;Returned demonstration          PT Short Term Goals - 03/28/15 1139    PT SHORT TERM GOAL #1   Title  The patient will have negative bilateral dix hallpike testing (to demo resolution of R posterior canalithiasis and R anterior canalithiasis).   Baseline Target date 04/27/2015   Time 4   Period Weeks   PT SHORT TERM GOAL #2   Title The patient will be indep with HEP for gaze adaptation, habituation, and general mobility.   Baseline Target date 04/27/2015   Time 4   Period Weeks   PT SHORT TERM GOAL #3   Title The patient will be further assessed for Berg balance test and goal to follow, as indicated.   Baseline Target date 04/27/2015   Time 4   Period Weeks   PT SHORT TERM GOAL #4   Title The patient will be further assessed for gait speed and goal to follow, as indicated.   Baseline Target date  04/27/2015   Time 4   Period Weeks   PT SHORT TERM GOAL #5   Title The patient will perform rolling R<>left without nystagmus viewed or subjective reports of dizziness.   Baseline Target date 04/27/2015   Time 4   Period Weeks           PT Long Term Goals - 03/28/15 1141    PT LONG TERM GOAL #1   Title The patient will be indep with HEP progression for balance, gait and general motion tolerance.   Baseline Target date 05/26/2015   Time 8   Period Weeks   PT LONG TERM GOAL #2   Title Merrilee Jansky goal to follow   Baseline Target date 04/27/2015   Time 8   Period Weeks   PT LONG TERM GOAL #3   Title Gait speed goal to follow   Baseline Target date 04/27/2015   Time 8   Period Weeks   PT LONG TERM GOAL #4   Title The patient will subjectively report vertigo improved by >50% since initial evaluation.   Baseline Target date 04/27/2015   Time 8   Period Weeks               Plan - 03/29/15 1231    Clinical Impression Statement The patient had negative R dix hallpike and L dix hallpike originally.  She iniitally had upbeat, rotary nystagmus with a horiozntal roll test that settled quickly, at another rep of rolling, she had clear downbeat nystagmus.  Still anticipate multi-canal nature of symptoms significantly improved from yesterday.   PT Next Visit Plan Reassess and determine how HEP is going well.  Assess Berg and gait speed, provide HEP   Consulted and Agree with Plan of Care Patient;Family member/caregiver   Family Member Consulted Son present        Problem List Patient Active Problem List   Diagnosis Date Noted  . Postconcussive syndrome 03/02/2015  . Head injury 02/03/2015  . Fragile X associated tremor ataxia syndrome 10/10/2014  . Generalized epilepsy ([place]) 10/10/2014  . Dysphagia 10/10/2014  . Tremor 10/10/2014  . Fatigue 02/01/2014  . Accidental fall 02/01/2014  . RAD (reactive airway disease) 02/01/2014  . Microalbuminuria 02/01/2014  . Cataract 10/15/2013   . Acid reflux 09/13/2013  . Essential (primary) hypertension 09/13/2013  . Hypercholesteremia 09/13/2013  . Diabetes mellitus, type 2 ([place]) 09/13/2013  . TIA (transient ischemic attack) 08/27/2013  . Headache(784.0) 08/27/2013  . Avitaminosis D 04/06/2013  . Obesity 04/06/2013  . Allergic rhinitis 11/27/2012  . Anxiety state 11/27/2012  . Clinical depression 11/27/2012  . Bone/cartilage disorder 11/27/2012  . Fragile X syndrome 11/27/2012  . Absence  of bladder continence 11/27/2012  . Arthralgia of hip or thigh 11/27/2012  . Cardiac conduction disorder 11/27/2012  . DS (disseminated sclerosis) ([place]) 11/27/2012    Giuseppina Quinones PT 03/29/2015, 12:33 PM  [facility] [address] [place], [place], [address] Phone: [phone]   Fax:  [phone]

## 2015-04-06 ENCOUNTER — Encounter: Payer: Self-pay | Admitting: Neurology

## 2015-04-06 ENCOUNTER — Ambulatory Visit: Payer: PPO | Admitting: Rehabilitative and Restorative Service Providers"

## 2015-04-06 ENCOUNTER — Ambulatory Visit (INDEPENDENT_AMBULATORY_CARE_PROVIDER_SITE_OTHER): Payer: PPO | Admitting: Neurology

## 2015-04-06 VITALS — BP 110/78 | HR 94 | Wt 221.0 lb

## 2015-04-06 DIAGNOSIS — G119 Hereditary ataxia, unspecified: Principal | ICD-10-CM

## 2015-04-06 DIAGNOSIS — Q992 Fragile X chromosome: Secondary | ICD-10-CM | POA: Diagnosis not present

## 2015-04-06 DIAGNOSIS — G250 Essential tremor: Principal | ICD-10-CM

## 2015-04-06 DIAGNOSIS — Z8673 Personal history of transient ischemic attack (TIA), and cerebral infarction without residual deficits: Secondary | ICD-10-CM | POA: Diagnosis not present

## 2015-04-06 DIAGNOSIS — F0781 Postconcussional syndrome: Secondary | ICD-10-CM | POA: Diagnosis not present

## 2015-04-06 NOTE — Patient Instructions (Signed)
I think your symptoms are related to the concussion and it should heal.  It may take some time, however. Follow up in 3 months

## 2015-04-06 NOTE — Progress Notes (Signed)
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  She is accompanied by her husband who provides some history.   UPDATE: Due to concerns of possible prior history of MS, she underwent MRI of the brain and cervical spine with and without contrast on 03/15/15.  There was slight progression of nonspecific cerebral  white matter changes, most probably chronic small vessel disease, compared to 2012.  Cervical spine showed no cord lesions.    She is receiving vestibular rehab which helps.  However she is concerned of persistent symptoms, such as emotional lability, hearing loss, dizziness, short-term memory problems, and blurred vision.  Sometimes when she notes a headache and dizziness, she exhibits nystagmus.  HISTORY: Her son has fragile X syndrome with severe autism.  About 20 years ago, she began experiencing dizzy spells.  She apparently had imaging of the head which was suspicious for possible multiple sclerosis.  She had lumbar puncture, which was negative.  About 10 years ago, she developed mild tremor and unsteadiness.  She was worked up by Dr. Hazle Nordmann, a fragile X specialist at [facility], who diagnosed her with fragile X-associated tremor ataxia syndrome.  Work up included genetic testing for FMR1 gene premutation.   She has had a progressive course over the years.  She will have dizzy spells, described as lightheadedness, which will cause falls.  She has fractured her wrists and leg.  She also has tremor of the hands, causing difficulty with writing.  She also developed dysphonia and was found to have tremor involving her "voice box".  She reports prior episodes of both bowel and bladder incontinence, which are rare.  She also  reports poor depth perception.  For example, she may sometimes drive too close to the curb or she may accidentally bump into somebody when walking through the grocery store aisle.  She reports increased fatigue and weight gain.  She has depression and mood swings.  She also reports short-term memory problems.  She also has problems with swallowing and has choked on some occasions, requiring the Heimlich maneuver.  She has a remote history of epilepsy.  She had convulsions as a baby and two generalized tonic-clonic seizures in her early twenties.  She has been on phenobarbital ever since.  No recurrent seizures.  She takes calcium 500mg  and vitamin D 600 units twice daily.  A DEXA scan was performed and was negative.  She continues on phenobarbital 97.5mg  at bedtime.  She also has history of migraine, described as severe pressure and throbbing, over her left eye.  In March 2015, she had such a headache associated with right sided facial numbness and lower facial weakness.  She was admitted to [facility] for TIA.  CT of the head was unremarkable.  MRI of the brain showed periventricular and subcortical hyperintensities.  MRA of the head showed no major intracranial arterial stenosis, however there is question of mild narrowing of the left cavernous ICA.  Carotid duplex showed 1-39% bilateral ICA stenosis.  2D echo showed LVEF of 60-65% with moderate LVH.  Hgb A1c was 6.5 and LDL was 85.  More acutely, she has suffered postconcussion syndrome since slipping and hitting the back of her head in August.  She says she passed afterwards for a few seconds.  Afterwards, she noted headache, spinning sensation, nausea and she vomited.  She went to the ED where CT of the head and cervical spine showed no acute findings.  She was discharged home with meclizine and hydrocodone.  Since she has been home, she still notes constant dull posterior headache.  She takes ibuprofen for it daily but not the hydrocodone.  The dizziness  is improving.  She has been more emotional and was crying easily.  She is seeing Dr. Hulan Saas of Sports Medicine for management.  PAST MEDICAL HISTORY: Past Medical History  Diagnosis Date  . Seizures ([place])   . Diabetes mellitus without complication ([place])   . Fragile X syndrome     ataxia syndrome.    [name] Hypertension   . Hemorrhoids   . Transient cerebral ischemia   . Vertigo, benign paroxysmal   . MS (multiple sclerosis) ([place])   . Cerebellar ataxia ([place])   . Stroke [facility])     MEDICATIONS: Current Outpatient Prescriptions on File Prior to Visit  Medication Sig Dispense Refill  . aspirin EC 81 MG EC tablet Take 1 tablet (81 mg total) by mouth daily.    [name] atorvastatin (LIPITOR) 80 MG tablet Take 1 tablet (80 mg total) by mouth at bedtime. 90 tablet 1  . Besifloxacin HCl (BESIVANCE) 0.6 % SUSP Place 1 drop into the left eye 3 (three) times daily.    [name] BREO ELLIPTA 100-25 MCG/INH AEPB Inhale 1 puff into the lungs daily.   11  . Bromfenac Sodium (PROLENSA) 0.07 % SOLN Place 1 drop into the left eye at bedtime.    . Calcium-Vitamin D 600-200 MG-UNIT per tablet Take 1 tablet by mouth 2 (two) times daily.    . cetirizine (ZYRTEC) 10 MG tablet Take 10 mg by mouth daily.  5  . clonazePAM (KLONOPIN) 1 MG tablet Take 1 tablet (1 mg total) by mouth 2 (two) times daily as needed for anxiety. 45 tablet 0  . Coenzyme Q10 (CO Q 10 PO) Take by mouth.    . [employer] CALCIUM 600+D 600-800 MG-UNIT TABS Take 1 tablet by mouth 2 (two) times daily with a meal.  3  . DULoxetine (CYMBALTA) 30 MG capsule Take 1 capsule (30 mg total) by mouth daily. 30 capsule 3  . ferrous sulfate 325 (65 FE) MG tablet Take 325 mg by mouth daily with breakfast.    . gabapentin (NEURONTIN) 100 MG capsule Take 2 capsules (200 mg total) by mouth at bedtime. 60 capsule 3  . HYDROcodone-acetaminophen (NORCO/VICODIN) 5-325 MG per tablet Take 1 tablet by mouth every 4 (four) hours as needed for severe pain. 10 tablet 0  . magnesium  oxide (MAG-OX) 400 MG tablet Take 400 mg by mouth daily.    . meclizine (ANTIVERT) 25 MG tablet Take 1 tablet (25 mg total) by mouth 3 (three) times daily as needed for dizziness. (Patient not taking: Reported on 03/28/2015) 30 tablet 0  . Melatonin 3 MG TABS Take by mouth.    . metoprolol succinate (TOPROL-XL) 25 MG 24 hr tablet Take 1 tablet (25 mg total) by mouth at bedtime. 90 tablet 0  . Omega-3 Fatty Acids (FISH OIL) 1200 MG CAPS Take by mouth.    . ondansetron (ZOFRAN ODT) 4 MG disintegrating tablet Take 1 tablet (4 mg total) by mouth every 8 (eight) hours as needed for nausea or vomiting. (Patient not taking: Reported on 03/28/2015) 20 tablet 0  . pantoprazole (PROTONIX) 40 MG tablet Take 1 tablet (40 mg  total) by mouth 2 (two) times daily. 60 tablet 3  . PHENobarbital (LUMINAL) 97.2 MG tablet Take 1 tablet (97.2 mg total) by mouth at bedtime. 30 tablet 0  . ranitidine (ZANTAC) 150 MG tablet Take 150 mg by mouth 2 (two) times daily as needed (for indigestion).     [name] scopolamine (TRANSDERM-SCOP) 1 MG/3DAYS Place 1 patch (1.5 mg total) onto the skin every 3 (three) days. (Patient not taking: Reported on 03/28/2015) 10 patch 0  . tetrahydrozoline (VISINE) 0.05 % ophthalmic solution Place 2 drops into both eyes 5 (five) times daily as needed (for dry eyes).    . Vitamin D, Ergocalciferol, (DRISDOL) 50000 UNITS CAPS capsule Take 50,000 Units by mouth once a week. Take on Fridays     No current facility-administered medications on file prior to visit.    ALLERGIES: Allergies  Allergen Reactions  . Buprenorphine Hcl Hives and Itching  . Meperidine Hives and Itching    Other reaction(s): HIVES,ITCHING  . Morphine And Related Hives and Itching  . Pentazocine Anaphylaxis  . Pentazocine Lactate Anaphylaxis  . Phenytoin Hives    Other reaction(s): RASH  . Demerol Hives and Itching  . Dilaudid [Hydromorphone Hcl]     Hallucinations and felt crazy   . Doxycycline Hyclate     Other reaction(s):  GI UPSET,NAUSEA,VOMITING  . Hydromorphone     Altered mental status  . Ibuprofen     Contraindicated for risk of GI bleeding.  [name] Phenytoin Sodium Extended Hives  . Codeine Rash    When mixed with Demerol Patient reports no reaction with Codeine/APAP (Tylenol #3)    FAMILY HISTORY: Family History  Problem Relation Age of Onset  . Stroke    . Fragile X syndrome Son     Patient is carrier  . Raynaud syndrome Mother   . Alzheimer's disease Father   . Diabetes Father   . Diabetes Sister   . Cancer Brother     brother  . Cancer Maternal Grandmother     cervical & breast   . Heart failure Maternal Grandfather   . Diabetes Paternal Grandmother     SOCIAL HISTORY: Social History   Social History  . Marital Status: Married    Spouse Name: N/A  . Number of Children: N/A  . Years of Education: N/A   Occupational History  . Not on file.   Social History Main Topics  . Smoking status: Never Smoker   . Smokeless tobacco: Never Used  . Alcohol Use: No  . Drug Use: No  . Sexual Activity:    Partners: Male   Other Topics Concern  . Not on file   Social History Narrative    REVIEW OF SYSTEMS: Constitutional: No fevers, chills, or sweats, no generalized fatigue, change in appetite Eyes: No visual changes, double vision, eye pain Ear, nose and throat: No hearing loss, ear pain, nasal congestion, sore throat Cardiovascular: No chest pain, palpitations Respiratory:  No shortness of breath at rest or with exertion, wheezes GastrointestinaI: No nausea, vomiting, diarrhea, abdominal pain, fecal incontinence Genitourinary:  No dysuria, urinary retention or frequency Musculoskeletal:  No neck pain, back pain Integumentary: No rash, pruritus, skin lesions Neurological: as above Psychiatric: No depression, insomnia, anxiety Endocrine: No palpitations, fatigue, diaphoresis, mood swings, change in appetite, change in weight, increased thirst Hematologic/Lymphatic:  No anemia,  purpura, petechiae. Allergic/Immunologic: no itchy/runny eyes, nasal congestion, recent allergic reactions, rashes  PHYSICAL EXAM: Filed Vitals:   04/06/15 0943  BP: 110/78  Pulse: 94  General: No acute distress.  Patient appears well-groomed.  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  MRI findings are more consistent with cerebrovascular disease  PLAN: I explained to Mrs. Exton and her husband that I feel all of her symptoms are related to the concussion.  I also reviewed the MRIs with them and explained that I do not think she has MS.  I explained that it may take time for symptoms to resolve, but I don't think there is any brain damage.  She will continue follow up with Dr. Tamala Julian and see me back in 3 months.  28 minutes spent face to face with patient, 100% spent discussing diagnosis and reviewing MRI images.   Metta Clines, DO  CC:  Scarlette Calico, MD

## 2015-04-09 ENCOUNTER — Ambulatory Visit: Payer: PPO | Admitting: Rehabilitative and Restorative Service Providers"

## 2015-04-09 DIAGNOSIS — R269 Unspecified abnormalities of gait and mobility: Secondary | ICD-10-CM

## 2015-04-09 DIAGNOSIS — H8111 Benign paroxysmal vertigo, right ear: Secondary | ICD-10-CM

## 2015-04-09 NOTE — Therapy (Signed)
[facility] [address] [place], [place], [address] Phone: [phone]   Fax:  [phone]  Physical Therapy Treatment  Patient Details  Name: Brandi Ramsey MRN: [id number] Date of Birth: 28-Jul-1950 No Data Recorded  Encounter Date: 04/09/2015      PT End of Session - 04/09/15 1040    Visit Number 3   Number of Visits 16   Date for PT Re-Evaluation 05/26/15   Authorization Type private insurance with $30 copay, no visit limit per epic notes   PT Start Time 0935   PT Stop Time 1030   PT Time Calculation (min) 55 min   Activity Tolerance Patient tolerated treatment well   Behavior During Therapy [facility] for tasks assessed/performed      Past Medical History  Diagnosis Date  . Seizures ([place])   . Diabetes mellitus without complication ([place])   . Fragile X syndrome     ataxia syndrome.    [name] Hypertension   . Hemorrhoids   . Transient cerebral ischemia   . Vertigo, benign paroxysmal   . MS (multiple sclerosis) ([place])   . Cerebellar ataxia ([place])   . Stroke [facility])     Past Surgical History  Procedure Laterality Date  . Abdominal hysterectomy    . Fracture surgery      tib/fib (R) leg    There were no vitals filed for this visit.  Visit Diagnosis:  BPPV (benign paroxysmal positional vertigo), right  Abnormality of gait      Subjective Assessment - 04/09/15 0939    Subjective The patient reports that visual blurriness is worse this morning.  She feels that the spinning vertigo symptoms are improving.  She still notes some symptoms with turning quickly or moving her head into certain positions.  She no longer feels dizziness with rolling in bed (still has top of bed raised up).   "My vision is not clear."  She is going to call  her eye doctor to f/u since the fall.    Patient is accompained by: Family member  son   Patient Stated Goals Return to driving and know I'm safe and improve balance.   Currently in Pain?  No/denies           Vestibular Assessment - 04/09/15 1043    Positional Testing   Sidelying Test Sidelying Right;Sidelying Left   Horizontal Canal Testing Horizontal Canal Right;Horizontal Canal Left   Sidelying Right   Sidelying Right Duration x seconds  symptoms more severe subjectively to R at first rep   Sidelying Right Symptoms Upbeat, right rotatory nystagmus   Sidelying Left   Sidelying Left Duration x seconds   Sidelying Left Symptoms Upbeat, left rotatory nystagmus   Horizontal Canal Right   Horizontal Canal Right Duration x seconds   Horizontal Canal Right Symptoms Ageotrophic   Horizontal Canal Left   Horizontal Canal Left Duration x seconds   Horizontal Canal Left Symptoms Ageotrophic            [facility] Adult PT Treatment/Exercise - 04/09/15 1046    Self-Care   Self-Care Other Self-Care Comments   Other Self-Care Comments  PT educated the patient and her son on ways to modify current habituation HEP in order to tolerate home exercises (adding 2nd pillow, resting in between) and also ways to ensure safe (performing on couch where feet can touch the floor for brandt daroff, having phone nearby, not walking immediately after exercise).   Also discussed compensatory strategies to reduce  imbalance during turns.         Vestibular Treatment/Exercise - 04/09/15 1044    Vestibular Treatment/Exercise   Vestibular Treatment Provided Canalith Repositioning;Habituation   Canalith Repositioning Canal Roll Left  Kim 2011 procedure with vibration at 1st and 4th positions   Habituation Exercises Laruth Bouchard Daroff;Horizontal Roll   Canal Roll Left   Number of Reps  1   Overall Response  --  aggravated symptoms at last position in prone   Response Details  patient tolerated treatment well today, will reassess nystagmus next visit   Nestor Lewandowsky   Number of Reps  3   Symptom Description  symtpoms improve with repetition   Horizontal Roll   Number of Reps  3   Symptom  Description  symptoms imrpove with repetition with nausea noted on first roll to the left          PT Short Term Goals - 03/28/15 1139    PT SHORT TERM GOAL #1   Title The patient will have negative bilateral dix hallpike testing (to demo resolution of R posterior canalithiasis and R anterior canalithiasis).   Baseline Target date 04/27/2015   Time 4   Period Weeks   PT SHORT TERM GOAL #2   Title The patient will be indep with HEP for gaze adaptation, habituation, and general mobility.   Baseline Target date 04/27/2015   Time 4   Period Weeks   PT SHORT TERM GOAL #3   Title The patient will be further assessed for Berg balance test and goal to follow, as indicated.   Baseline Target date 04/27/2015   Time 4   Period Weeks   PT SHORT TERM GOAL #4   Title The patient will be further assessed for gait speed and goal to follow, as indicated.   Baseline Target date 04/27/2015   Time 4   Period Weeks   PT SHORT TERM GOAL #5   Title The patient will perform rolling R<>left without nystagmus viewed or subjective reports of dizziness.   Baseline Target date 04/27/2015   Time 4   Period Weeks           PT Long Term Goals - 03/28/15 1141    PT LONG TERM GOAL #1   Title The patient will be indep with HEP progression for balance, gait and general motion tolerance.   Baseline Target date 05/26/2015   Time 8   Period Weeks   PT LONG TERM GOAL #2   Title Merrilee Jansky goal to follow   Baseline Target date 04/27/2015   Time 8   Period Weeks   PT LONG TERM GOAL #3   Title Gait speed goal to follow   Baseline Target date 04/27/2015   Time 8   Period Weeks   PT LONG TERM GOAL #4   Title The patient will subjectively report vertigo improved by >50% since initial evaluation.   Baseline Target date 04/27/2015   Time 8   Period Weeks               Plan - 04/09/15 1041    Clinical Impression Statement The patient appears to continue with bilateral R/L posterior canalithiasis (noted through  + bilateral sidelying tests), and L horizontal cupulolithiasis (noted from apogeotropic nystagmus worse with R horizontal rolling).  PT initiated treatment of L horizontal cuulolithiasis and reviewed habituation HEP for home.   PT Next Visit Plan Check BPPV, teach gaze x1 , Berg + gait speed assessment, progress HEP   Consulted  and Agree with Plan of Care Patient;Family member/caregiver   Family Member Consulted Son present        Problem List Patient Active Problem List   Diagnosis Date Noted  . Postconcussive syndrome 03/02/2015  . Head injury 02/03/2015  . Fragile X associated tremor ataxia syndrome 10/10/2014  . Generalized epilepsy ([place]) 10/10/2014  . Dysphagia 10/10/2014  . Tremor 10/10/2014  . Fatigue 02/01/2014  . Accidental fall 02/01/2014  . RAD (reactive airway disease) 02/01/2014  . Microalbuminuria 02/01/2014  . Cataract 10/15/2013  . Acid reflux 09/13/2013  . Essential (primary) hypertension 09/13/2013  . Hypercholesteremia 09/13/2013  . Diabetes mellitus, type 2 ([place]) 09/13/2013  . TIA (transient ischemic attack) 08/27/2013  . Headache(784.0) 08/27/2013  . Avitaminosis D 04/06/2013  . Obesity 04/06/2013  . Allergic rhinitis 11/27/2012  . Anxiety state 11/27/2012  . Clinical depression 11/27/2012  . Bone/cartilage disorder 11/27/2012  . Fragile X syndrome 11/27/2012  . Absence of bladder continence 11/27/2012  . Arthralgia of hip or thigh 11/27/2012  . Cardiac conduction disorder 11/27/2012  . DS (disseminated sclerosis) ([place]) 11/27/2012    Alphonso Gregson, PT 04/09/2015, 10:48 AM  [facility] [address] [place], [place], [address] Phone: [phone]   Fax:  [phone]  Name: Brandi Ramsey MRN: [id number] Date of Birth: 12-Feb-1951

## 2015-04-11 ENCOUNTER — Encounter: Payer: Self-pay | Admitting: Family Medicine

## 2015-04-11 ENCOUNTER — Ambulatory Visit (INDEPENDENT_AMBULATORY_CARE_PROVIDER_SITE_OTHER): Payer: PPO | Admitting: Family Medicine

## 2015-04-11 VITALS — BP 118/82 | HR 96

## 2015-04-11 DIAGNOSIS — F0781 Postconcussional syndrome: Secondary | ICD-10-CM | POA: Diagnosis not present

## 2015-04-11 NOTE — Assessment & Plan Note (Signed)
Patient is doing much better at this time. We discussed patient to continue home exercises as well as continue with formal physical therapy for the BPPV-type symptoms. I do think the patient will do very well with the conservative therapy and we'll see me one more time in 4-6 weeks for further evaluation and likely will be released at that time.

## 2015-04-11 NOTE — Progress Notes (Signed)
[facility] [place], [place] [address] Phone: [phone] Subjective:    I'm seeing this patient by the request  of:  [name] MD  CC: Post concussion.   WNU:Brandi Ramsey is a 64 y.o. female coming in with complaint of  Date of concussion: 01/29/2015  She was initially seen in the emergency department for contusion on her head. Patient had a CT of the cervical spine as well as a CT of the head at that time. These were reviewed by me. Patient did have some cervical spine spondylosis but otherwise fairly unremarkable.  Patient was seen by me and has been treated for more of a postconcussive syndrome. Continued to have some difficult he with balance and coordination. Patient has been sent to formal physical therapy. Patient has gone 3 times since last visit. Patient statesshe continues on the gabapentin 200 mg at bedtime. We also gave patient high-dose vitamin D supplementation. Patient was to take iron as well as a regular basis. Patient statesshe is feeling much better. States that the vertigo symptoms seems to be better. Not having any headaches really a regular basis at this time. Feels that she is making improvement overall. Past medical history significant for a transient ischemic attack as well as depression and migraines with remote history of epilepsy   Past Medical History  Diagnosis Date  . Seizures ([place])   . Diabetes mellitus without complication ([place])   . Fragile X syndrome     ataxia syndrome.    [name] Hypertension   . Hemorrhoids   . Transient cerebral ischemia   . Vertigo, benign paroxysmal   . MS (multiple sclerosis) ([place])   . Cerebellar ataxia ([place])   . Stroke [facility])    Past Surgical History  Procedure Laterality Date  . Abdominal hysterectomy    . Fracture surgery      tib/fib (R) leg   Social History  Substance Use Topics  . Smoking status: Never Smoker   . Smokeless tobacco: Never Used  . Alcohol Use: No    Allergies  Allergen Reactions  . Buprenorphine Hcl Hives and Itching  . Meperidine Hives and Itching    Other reaction(s): HIVES,ITCHING  . Morphine And Related Hives and Itching  . Pentazocine Anaphylaxis  . Pentazocine Lactate Anaphylaxis  . Phenytoin Hives    Other reaction(s): RASH  . Demerol Hives and Itching  . Dilaudid [Hydromorphone Hcl]     Hallucinations and felt crazy   . Doxycycline Hyclate     Other reaction(s): GI UPSET,NAUSEA,VOMITING  . Hydromorphone     Altered mental status  . Ibuprofen     Contraindicated for risk of GI bleeding.  [name] Phenytoin Sodium Extended Hives  . Codeine Rash    When mixed with Demerol Patient reports no reaction with Codeine/APAP (Tylenol #3)   Family History  Problem Relation Age of Onset  . Stroke    . Fragile X syndrome Son     Patient is carrier  . Raynaud syndrome Mother   . Alzheimer's disease Father   . Diabetes Father   . Diabetes Sister   . Cancer Brother     brother  . Cancer Maternal Grandmother     cervical & breast   . Heart failure Maternal Grandfather   . Diabetes Paternal Grandmother        Past medical history, social, surgical and family history all reviewed in electronic medical record.   Review of Systems: No headache,  visual changes, nausea, vomiting, diarrhea, constipation, dizziness, abdominal pain, skin rash, fevers, chills, night sweats, weight loss, swollen lymph nodes, body aches, joint swelling, muscle aches, chest pain, shortness of breath, mood changes.   Objective Blood pressure 118/82, pulse 96, SpO2 94 %.  General: No apparent distress alert and oriented x3 mood and affect normal, dressed appropriately.  HEENT: Pupils equal, extraocular movements intact  Respiratory: Patient's speak in full sentences and does not appear short of breath  Cardiovascular: No lower extremity edema, non tender, no erythema  Skin: Warm dry intact with no signs of infection or rash on extremities or on axial  skeleton.  Abdomen: Soft nontender  Neuro: Cranial nerves II through XII are intact, neurovascularly intact in all extremities with 2+ DTRs and 2+ pulses.  Lymph: No lymphadenopathy of posterior or anterior cervical chain or axillae bilaterally.  Gait normal with good balance and coordination.  MSK:  Non tender with full range of motion and good stability and symmetric strength and tone of shoulders, elbows, wrist, hip, knee and ankles bilaterally.  Neck: Inspection unremarkable. No palpable stepoffs. Negative Spurling's maneuver. Full neck range of motion Grip strength and sensation normal in bilateral hands Strength good C4 to T1 distribution No sensory change to C4 to T1 Negative Hoffman sign bilaterally Reflexes normal   The patient with this tubular neuro testing still has some very minimal nystagmus noted. Did much better with change of position.       Impression and Recommendations:     This case required medical decision making of moderate complexity.

## 2015-04-11 NOTE — Patient Instructions (Addendum)
Good to see you You are making progress For your back when tight try exercises Continue the vitamins for now.  Stay active and finish with physical therapy.  See me hopefully one more time in 4-6 weeks.

## 2015-04-12 ENCOUNTER — Ambulatory Visit: Payer: PPO | Admitting: Rehabilitative and Restorative Service Providers"

## 2015-04-12 DIAGNOSIS — R269 Unspecified abnormalities of gait and mobility: Secondary | ICD-10-CM

## 2015-04-12 DIAGNOSIS — H8111 Benign paroxysmal vertigo, right ear: Secondary | ICD-10-CM | POA: Diagnosis not present

## 2015-04-12 NOTE — Therapy (Signed)
[facility] [address] [place], [place], [address] Phone: [phone]   Fax:  [phone]  Physical Therapy Treatment  Patient Details  Name: Brandi Ramsey MRN: [id number] Date of Birth: 03/08/1951 No Data Recorded  Encounter Date: 04/12/2015      PT End of Session - 04/12/15 1224    Visit Number 4   Number of Visits 16   Date for PT Re-Evaluation 05/26/15   Authorization Type private insurance with $30 copay, no visit limit per epic notes   PT Start Time 1100   PT Stop Time 1200   PT Time Calculation (min) 60 min   Equipment Utilized During Treatment Gait belt   Activity Tolerance Patient tolerated treatment well   Behavior During Therapy [facility] for tasks assessed/performed      Past Medical History  Diagnosis Date  . Seizures ([place])   . Diabetes mellitus without complication ([place])   . Fragile X syndrome     ataxia syndrome.    [name] Hypertension   . Hemorrhoids   . Transient cerebral ischemia   . Vertigo, benign paroxysmal   . MS (multiple sclerosis) ([place])   . Cerebellar ataxia ([place])   . Stroke [facility])     Past Surgical History  Procedure Laterality Date  . Abdominal hysterectomy    . Fracture surgery      tib/fib (R) leg    There were no vitals filed for this visit.  Visit Diagnosis:  Abnormality of gait  BPPV (benign paroxysmal positional vertigo), right      Subjective Assessment - 04/12/15 1108    Subjective The patient reports she saw MD yesterday and her doctor feels she is doing better.   She tried HEP and it did provoke dizziness last night.  She is continuing to perform each day.   Patient's cc: is "fogginess" in vision + room spinning with movement.   Patient is accompained by: Family member   Patient Stated Goals Return to driving and know I'm safe and improve balance.   Currently in Pain? No/denies            [facility] PT Assessment - 04/12/15 1113    Ambulation/Gait    Ambulation/Gait Yes   Ambulation/Gait Assistance 7: Independent   Ambulation Distance (Feet) 200 Feet   Assistive device None   Ambulation Surface Level   Gait velocity 3.21 ft/sec   Gait Comments PT instructed patient today in [facility] use as she reports she would not feel comfortable walking for exercise unless she had something for stability.  She has difficulty coordinating pattern with SPC and PT recommended that if she pursues getting cane, she practice indoors to gain greater confidence with pattern.   Standardized Balance Assessment   Standardized Balance Assessment Berg Balance Test;Dynamic Gait Index   Berg Balance Test   Sit to Stand Able to stand without using hands and stabilize independently   Standing Unsupported Able to stand safely 2 minutes   Sitting with Back Unsupported but Feet Supported on Floor or Stool Able to sit safely and securely 2 minutes   Stand to Sit Sits safely with minimal use of hands   Transfers Able to transfer safely, minor use of hands   Standing Unsupported with Eyes Closed Able to stand 10 seconds with supervision   Standing Ubsupported with Feet Together Able to place feet together independently and stand for 1 minute with supervision   From Standing, Reach Forward with Outstretched Arm Can reach confidently >25  cm (10")   From Standing Position, Pick up Object from [place] to pick up shoe, needs supervision   From Standing Position, Turn to Look Behind Over each Shoulder Turn sideways only but maintains balance   Turn 360 Degrees Needs close supervision or verbal cueing   Standing Unsupported, Alternately Place Feet on Step/Stool Able to stand independently and safely and complete 8 steps in 20 seconds   Standing Unsupported, One Foot in Front Able to plae foot ahead of the other independently and hold 30 seconds   Standing on One Leg Able to lift leg independently and hold equal to or more than 3 seconds   Total Score 45   Berg comment: 45/56    Dynamic Gait Index   Level Surface Normal   Change in Gait Speed Normal   Gait with Horizontal Head Turns Moderate Impairment  7/10 dizziness   Gait with Vertical Head Turns Mild Impairment   Gait and Pivot Turn Mild Impairment   Step Over Obstacle Mild Impairment   Step Around Obstacles Normal   Steps Moderate Impairment  step to descending due to h/o falls   Total Score 17   DGI comment: 17/24             Vestibular Assessment - 04/12/15 1132    Positional Testing   Dix-Hallpike Dix-Hallpike Right;Dix-Hallpike Left   Sidelying Test Sidelying Right;Sidelying Left   Dix-Hallpike Right   Dix-Hallpike Right Symptoms No nystagmus   Dix-Hallpike Left   Dix-Hallpike Left Symptoms --  trace nystagmus <5 beats noted   Sidelying Right   Sidelying Right Duration none   Sidelying Right Symptoms No nystagmus   Sidelying Left   Sidelying Left Duration trace   Sidelying Left Symptoms No nystagmus            [facility] Adult PT Treatment/Exercise - 04/12/15 1225    Neuro Re-ed    Neuro Re-ed Details  Corner balance exercises including feet apart + feet together with eyes closed, standing with feet staggered with eyes open, standing trunk + head rotation and standing head turns.            PT Education - 04/12/15 1224    Education provided Yes   Education Details HEP: standing corner balance with eyes closed and feet apart, feet apart + head turns.   Person(s) Educated Patient;Child(ren)   Methods Explanation;Demonstration;Handout   Comprehension Verbalized understanding;Returned demonstration          PT Short Term Goals - 04/12/15 1239    PT SHORT TERM GOAL #1   Title The patient will have negative bilateral dix hallpike testing (to demo resolution of R posterior canalithiasis and R anterior canalithiasis).   Baseline Target date 04/27/2015   Time 4   Period Weeks   Status On-going   PT SHORT TERM GOAL #2   Title The patient will be indep with HEP for gaze adaptation,  habituation, and general mobility.   Baseline Met on 04/12/2015   Time 4   Period Weeks   Status Achieved   PT SHORT TERM GOAL #3   Title The patient will be further assessed for Berg balance test and goal to follow, as indicated.   Baseline Patient met on 04/12/2015 scoring 45/56.   Time 4   Period Weeks   Status Achieved   PT SHORT TERM GOAL #4   Title The patient will be further assessed for gait speed and goal to follow, as indicated.   Baseline 3.21 ft/sec on  04/12/2015   Time 4   Period Weeks   Status Achieved   PT SHORT TERM GOAL #5   Title The patient will perform rolling R<>left without nystagmus viewed or subjective reports of dizziness.   Baseline Target date 04/27/2015   Time 4   Period Weeks   Status On-going           PT Long Term Goals - 04/12/15 1241    PT LONG TERM GOAL #1   Title The patient will be indep with HEP progression for balance, gait and general motion tolerance.   Baseline Target date 05/26/2015   Time 8   Period Weeks   Status On-going   PT LONG TERM GOAL #2   Title Patient will improve Berg to > or equal to 50/56.   Baseline Target date 05/26/2015   Time 8   Period Weeks   Status Revised   PT LONG TERM GOAL #3   Title Gait speed goal to follow   Baseline *patient a community ambulator per gait speed on 04/12/2015.   Time 8   Period Weeks   Status Deferred   PT LONG TERM GOAL #4   Title The patient will subjectively report vertigo improved by >50% since initial evaluation.   Baseline Target date 05/26/2015   Time 8   Period Weeks   Status On-going   PT LONG TERM GOAL #5   Title Improve DGI to > or equal to 21/24 for improved dynamic gait activities.   Baseline Target date 05/26/2015   Time 8   Period Weeks   Status New               Plan - 04/12/15 1236    Clinical Impression Statement The patient's posterior canal BPPV is nearly resolved (only trace nystagmus seen in L dix hallpike) and addressing remaining deficits  through HEP for habituation.  PT recommending increasing activity, as long as patient is safe with ambulation, and added balance HEP to challenge head turns and eyes closed.  Patient scores 45/56 Berg and 17/24 DGI.   PT Next Visit Plan check horiozntal canal, progress gaze adaptation exercises, progress HEP.   Consulted and Agree with Plan of Care Patient;Family member/caregiver   Family Member Consulted Son present        Problem List Patient Active Problem List   Diagnosis Date Noted  . Postconcussive syndrome 03/02/2015  . Head injury 02/03/2015  . Fragile X associated tremor ataxia syndrome 10/10/2014  . Generalized epilepsy ([place]) 10/10/2014  . Dysphagia 10/10/2014  . Tremor 10/10/2014  . Fatigue 02/01/2014  . Accidental fall 02/01/2014  . RAD (reactive airway disease) 02/01/2014  . Microalbuminuria 02/01/2014  . Cataract 10/15/2013  . Acid reflux 09/13/2013  . Essential (primary) hypertension 09/13/2013  . Hypercholesteremia 09/13/2013  . Diabetes mellitus, type 2 ([place]) 09/13/2013  . TIA (transient ischemic attack) 08/27/2013  . Headache(784.0) 08/27/2013  . Avitaminosis D 04/06/2013  . Obesity 04/06/2013  . Allergic rhinitis 11/27/2012  . Anxiety state 11/27/2012  . Clinical depression 11/27/2012  . Bone/cartilage disorder 11/27/2012  . Fragile X syndrome 11/27/2012  . Absence of bladder continence 11/27/2012  . Arthralgia of hip or thigh 11/27/2012  . Cardiac conduction disorder 11/27/2012  . DS (disseminated sclerosis) ([place]) 11/27/2012    [place], PT 04/12/2015, 12:43 PM  [facility] [address] [place], [place], [address] Phone: [phone]   Fax:  [phone]  Name: BRODIE SCOVELL MRN: [id number] Date of Birth:  January 19, 1951

## 2015-04-12 NOTE — Patient Instructions (Signed)
Feet Apart, Varied Arm Positions - Eyes Closed    In the corner with a chair in front of you.  Stand with feet shoulder width apart and arms at your side. Close eyes and visualize upright position. Hold _30 ___ seconds. Repeat _3___ times per session. Do __1-2__ sessions per day.  Copyright  VHI. All rights reserved.  Feet Apart, Head Motion - Eyes Open    With eyes open, feet apart, move head slowly: side to side. Repeat _5___ times per session. Do __1-2__ sessions per day.  Copyright  VHI. All rights reserved.

## 2015-04-16 ENCOUNTER — Ambulatory Visit: Payer: PPO | Admitting: Rehabilitative and Restorative Service Providers"

## 2015-04-16 DIAGNOSIS — F0781 Postconcussional syndrome: Secondary | ICD-10-CM

## 2015-04-16 DIAGNOSIS — H8111 Benign paroxysmal vertigo, right ear: Secondary | ICD-10-CM

## 2015-04-16 DIAGNOSIS — R269 Unspecified abnormalities of gait and mobility: Secondary | ICD-10-CM

## 2015-04-16 NOTE — Therapy (Signed)
[facility] [address] [place], [place], [address] Phone: [phone]   Fax:  [phone]  Physical Therapy Treatment  Patient Details  Name: Brandi Ramsey MRN: [id number] Date of Birth: 1950/12/25 No Data Recorded  Encounter Date: 04/16/2015      PT End of Session - 04/16/15 0959    Visit Number 5   Number of Visits 16   Date for PT Re-Evaluation 05/26/15   Authorization Type private insurance with $30 copay, no visit limit per epic notes   PT Start Time 0940   PT Stop Time 1020   PT Time Calculation (min) 40 min   Activity Tolerance Patient tolerated treatment well   Behavior During Therapy [facility] for tasks assessed/performed      Past Medical History  Diagnosis Date  . Seizures ([place])   . Diabetes mellitus without complication ([place])   . Fragile X syndrome     ataxia syndrome.    [name] Hypertension   . Hemorrhoids   . Transient cerebral ischemia   . Vertigo, benign paroxysmal   . MS (multiple sclerosis) ([place])   . Cerebellar ataxia ([place])   . Stroke [facility])     Past Surgical History  Procedure Laterality Date  . Abdominal hysterectomy    . Fracture surgery      tib/fib (R) leg    There were no vitals filed for this visit.  Visit Diagnosis:  Abnormality of gait  BPPV (benign paroxysmal positional vertigo), right  Post concussion syndrome      Subjective Assessment - 04/16/15 0940    Subjective The patient reports occasional spells of dizziness, "very rare".  She felt one episode Friday  night when getting out of bed.  She was busy all weekend.  "Fogginess" in vision is still present.  The headache is gone.  The dizziness and fogginess is still there.     Patient Stated Goals Return to driving and know I'm safe and improve balance.   Currently in Pain? No/denies           Vestibular Assessment - 04/16/15 0951    Positional Testing   Horizontal Canal Testing Horizontal Canal Left;Horizontal Canal  Right   Horizontal Canal Right   Horizontal Canal Right Duration seconds   Horizontal Canal Right Symptoms Normal  subjective report of visual movement toward ceiling   Horizontal Canal Left   Horizontal Canal Left Duration seconds   Horizontal Canal Left Symptoms Normal  sub report of visual movement toward ceiling, no nystagmus           [facility] Adult PT Treatment/Exercise - 04/16/15 1358    Ambulation/Gait   Ambulation/Gait Yes   Ambulation/Gait Assistance 7: Independent   Ambulation Distance (Feet) 230 Feet   Assistive device None   Ambulation Surface Level   Gait Comments gait with cues on arm swing and increasing speed  used in our session to allow patient to have symptoms resolve while still moving.         Vestibular Treatment/Exercise - 04/16/15 7628    Vestibular Treatment/Exercise   Vestibular Treatment Provided Habituation;Gaze   Habituation Exercises Laruth Bouchard Daroff;Horizontal Roll   Gaze Exercises X1 Viewing Horizontal;X1 Viewing Vertical   Nestor Lewandowsky   Number of Reps  3   Symptom Description  symptoms improve with repetition   Horizontal Roll   Number of Reps  4   Symptom Description  symptoms improve with repetition.  patient experiences mild sense of spinning + nausea.  X1 Viewing Horizontal   Foot Position seated   Time --  5 reps (initially tried 10 reps, HA develops)   Reps 2   Comments cues for visual focus/fixation   X1 Viewing Vertical   Foot Position seated   Time --  5 reps   Reps 1   Comments cues for visual fixation on target               PT Education - 04/16/15 1354    Education provided Yes   Education Details added gaze and provided new handout with all exercises on one packet.   Person(s) Educated Patient;Child(ren)   Methods Explanation;Demonstration;Handout   Comprehension Verbalized understanding;Returned demonstration          PT Short Term Goals - 04/12/15 1239    PT SHORT TERM GOAL #1   Title The patient  will have negative bilateral dix hallpike testing (to demo resolution of R posterior canalithiasis and R anterior canalithiasis).   Baseline Target date 04/27/2015   Time 4   Period Weeks   Status On-going   PT SHORT TERM GOAL #2   Title The patient will be indep with HEP for gaze adaptation, habituation, and general mobility.   Baseline Met on 04/12/2015   Time 4   Period Weeks   Status Achieved   PT SHORT TERM GOAL #3   Title The patient will be further assessed for Berg balance test and goal to follow, as indicated.   Baseline Patient met on 04/12/2015 scoring 45/56.   Time 4   Period Weeks   Status Achieved   PT SHORT TERM GOAL #4   Title The patient will be further assessed for gait speed and goal to follow, as indicated.   Baseline 3.21 ft/sec on 04/12/2015   Time 4   Period Weeks   Status Achieved   PT SHORT TERM GOAL #5   Title The patient will perform rolling R<>left without nystagmus viewed or subjective reports of dizziness.   Baseline Target date 04/27/2015   Time 4   Period Weeks   Status On-going           PT Long Term Goals - 04/12/15 1241    PT LONG TERM GOAL #1   Title The patient will be indep with HEP progression for balance, gait and general motion tolerance.   Baseline Target date 05/26/2015   Time 8   Period Weeks   Status On-going   PT LONG TERM GOAL #2   Title Patient will improve Berg to > or equal to 50/56.   Baseline Target date 05/26/2015   Time 8   Period Weeks   Status Revised   PT LONG TERM GOAL #3   Title Gait speed goal to follow   Baseline *patient a community ambulator per gait speed on 04/12/2015.   Time 8   Period Weeks   Status Deferred   PT LONG TERM GOAL #4   Title The patient will subjectively report vertigo improved by >50% since initial evaluation.   Baseline Target date 05/26/2015   Time 8   Period Weeks   Status On-going   PT LONG TERM GOAL #5   Title Improve DGI to > or equal to 21/24 for improved dynamic gait  activities.   Baseline Target date 05/26/2015   Time 8   Period Weeks   Status New               Plan - 04/16/15 1354  Clinical Impression Statement The patient has a trace amount of spin reported with horizontal rolling that clears in seconds worse to the left side.  No nystagmus are viewed in room light.  Pt progressed/added gaze adaptation x 1 viewing in seated position to begin treating general "foginess" noted that is worse with movement.   PT Next Visit Plan progress habituation, gaze adaptation and HEP to tolerance.   Consulted and Agree with Plan of Care Patient;Family member/caregiver        Problem List Patient Active Problem List   Diagnosis Date Noted  . Postconcussive syndrome 03/02/2015  . Head injury 02/03/2015  . Fragile X associated tremor ataxia syndrome 10/10/2014  . Generalized epilepsy ([place]) 10/10/2014  . Dysphagia 10/10/2014  . Tremor 10/10/2014  . Fatigue 02/01/2014  . Accidental fall 02/01/2014  . RAD (reactive airway disease) 02/01/2014  . Microalbuminuria 02/01/2014  . Cataract 10/15/2013  . Acid reflux 09/13/2013  . Essential (primary) hypertension 09/13/2013  . Hypercholesteremia 09/13/2013  . Diabetes mellitus, type 2 ([place]) 09/13/2013  . TIA (transient ischemic attack) 08/27/2013  . Headache(784.0) 08/27/2013  . Avitaminosis D 04/06/2013  . Obesity 04/06/2013  . Allergic rhinitis 11/27/2012  . Anxiety state 11/27/2012  . Clinical depression 11/27/2012  . Bone/cartilage disorder 11/27/2012  . Fragile X syndrome 11/27/2012  . Absence of bladder continence 11/27/2012  . Arthralgia of hip or thigh 11/27/2012  . Cardiac conduction disorder 11/27/2012  . DS (disseminated sclerosis) ([place]) 11/27/2012    Nyilah Kight, PT 04/16/2015, 1:59 PM  [facility] [address] [place], [place], [address] Phone: [phone]   Fax:  [phone]  Name: Brandi Ramsey MRN:  [id number] Date of Birth: 1950-10-22

## 2015-04-16 NOTE — Patient Instructions (Signed)
Tip Card 1.The goal of habituation training is to assist in decreasing symptoms of vertigo, dizziness, or nausea provoked by specific head and body motions. 2.These exercises may initially increase symptoms; however, be persistent and work through symptoms. With repetition and time, the exercises will assist in reducing or eliminating symptoms. 3.Exercises should be stopped and discussed with the therapist if you experience any of the following: - Sudden change or fluctuation in hearing - New onset of ringing in the ears, or increase in current intensity - Any fluid discharge from the ear - Severe pain in neck or back - Extreme nausea  Copyright  VHI. All rights reserved.  Rolling   With pillow under head, start on back. Roll to your right side. Hold until dizziness stops, plus 20 seconds and then roll to the left side. Hold until dizziness stops, plus 20 seconds. Repeat sequence 3 times per session. Do 2 sessions per day.  Copyright  VHI. All rights reserved.  Sit to Side-Lying   Sit on edge of bed. Lie down onto the right side and hold until dizziness stops, plus 20 seconds. Return to sitting and wait until dizziness stops, plus 20 seconds. Repeat to the left side. Repeat sequence 3 times per session. Do 2 sessions per day.  Begin with one pillow and you can remove the pillow once you tolerate 5 repetitions without dizziness or nausea. Copyright  VHI. All rights reserved.   Feet Apart, Varied Arm Positions - Eyes Closed    In the corner with a chair in front of you. Stand with feet shoulder width apart and arms at your side. Close eyes and visualize upright position. Hold _30___ seconds. Repeat _3___ times per session. Do __1-2__ sessions per day.  Copyright  VHI. All rights reserved.  Feet Apart, Head Motion - Eyes Open    With eyes open, feet apart, move head slowly: side to side. Repeat _5___ times per session. Do __1-2__ sessions per day.   Gaze  Stabilization: Tip Card 1.Target must remain in focus, not blurry, and appear stationary while head is in motion. 2.Perform exercises with small head movements (45 to either side of midline). 3.Increase speed of head motion so long as target is in focus. 4.If you wear eyeglasses, be sure you can see target through lens (therapist will give specific instructions for bifocal / progressive lenses). 5.These exercises may provoke dizziness or nausea. Work through these symptoms. If too dizzy, slow head movement slightly. Rest between each exercise. 6.Exercises demand concentration; avoid distractions. 7.For safety, perform standing exercises close to a counter, wall, corner, or next to someone.  Copyright  VHI. All rights reserved.  Gaze Stabilization: Standing Feet Apart  *ONLY DO IN SEATED POSITION*   Feet shoulder width apart, keeping eyes on target on wall 3 feet away, tilt head down slightly and move head side to side for 5 TIMES. Repeat while moving head up and down for 5 TIMES. Do 2 sessions per day.  Copyright  VHI. All rights reserved.   Special Instructions: Exercises may bring on mild to moderate symptoms of nausea, dizziness that resolve within 30 minutes of completing exercises. If symptoms are lasting longer than 30 minutes, modify your exercises by: >decreasing the # of times you complete each activity >ensuring your symptoms return to baseline before moving onto the next exercise >dividing up exercises so you do not do them all in one session, but multiple short sessions throughout the day >doing them once a day until symptoms improve

## 2015-04-18 ENCOUNTER — Encounter: Payer: PPO | Admitting: Rehabilitative and Restorative Service Providers"

## 2015-04-20 ENCOUNTER — Ambulatory Visit: Payer: PPO | Admitting: Rehabilitative and Restorative Service Providers"

## 2015-04-20 ENCOUNTER — Telehealth: Payer: Self-pay | Admitting: Neurology

## 2015-04-20 DIAGNOSIS — F0781 Postconcussional syndrome: Secondary | ICD-10-CM

## 2015-04-20 DIAGNOSIS — R269 Unspecified abnormalities of gait and mobility: Secondary | ICD-10-CM

## 2015-04-20 DIAGNOSIS — H8111 Benign paroxysmal vertigo, right ear: Secondary | ICD-10-CM | POA: Diagnosis not present

## 2015-04-20 NOTE — Therapy (Signed)
[facility] [address] [place], [place], [address] Phone: [phone]   Fax:  [phone]  Physical Therapy Treatment  Patient Details  Name: Brandi Ramsey MRN: [id number] Date of Birth: 07-07-1950 No Data Recorded  Encounter Date: 04/20/2015      PT End of Session - 04/20/15 1441    Visit Number 6   Number of Visits 16   Date for PT Re-Evaluation 05/26/15   Authorization Type private insurance with $30 copay, no visit limit per epic notes   PT Start Time 1315   PT Stop Time 1400   PT Time Calculation (min) 45 min   Activity Tolerance Patient tolerated treatment well   Behavior During Therapy [facility] for tasks assessed/performed      Past Medical History  Diagnosis Date  . Seizures ([place])   . Diabetes mellitus without complication ([place])   . Fragile X syndrome     ataxia syndrome.    [name] Hypertension   . Hemorrhoids   . Transient cerebral ischemia   . Vertigo, benign paroxysmal   . MS (multiple sclerosis) ([place])   . Cerebellar ataxia ([place])   . Stroke [facility])     Past Surgical History  Procedure Laterality Date  . Abdominal hysterectomy    . Fracture surgery      tib/fib (R) leg    There were no vitals filed for this visit.  Visit Diagnosis:  Abnormality of gait  Post concussion syndrome      Subjective Assessment - 04/20/15 1323    Subjective The patient felt that the loud gym the other day was too overstimulating.  She had a headache after our session that continued into Wednesday.  She is not getting any dizziness at home with home exercise program.  She c/o visual blurring with head turns.     Patient Stated Goals Return to driving and know I'm safe and improve balance.   Currently in Pain? No/denies            [facility] Adult PT Treatment/Exercise - 04/20/15 1437    Ambulation/Gait   Ambulation/Gait Yes   Ambulation/Gait Assistance 7: Independent   Ambulation Distance (Feet) 230 Feet   Assistive device None   Ambulation Surface Level   Gait Comments Gait adding horizontal and vertical head turns with SBA for safety on level surfaces without dizziness noted today.   Self-Care   Self-Care Other Self-Care Comments   Other Self-Care Comments  Discussed focus of PT now will be on improving movement tolerance and return to prior functional status since vertigo appears cleared at this time.  Will reassess for vertigo if indicated.   Manual Therapy   Manual Therapy Soft tissue mobilization   Soft tissue mobilization suboccipital stretching with passive overpressure and P/ROM into rotation, sidebending and flexion with passive overpressure due to muscle guarding with recent h/o positional vertigo.         Vestibular Treatment/Exercise - 04/20/15 1439    Vestibular Treatment/Exercise   Vestibular Treatment Provided Habituation;Gaze   Habituation Exercises Standing Horizontal Head Turns;Standing Vertical Head Turns   Gaze Exercises X1 Viewing Horizontal;X1 Viewing Vertical   Nestor Lewandowsky   Symptom Description  patient reports no vertigo brought on with this exercise in the home.   Horizontal Roll   Number of Reps  1   Symptom Description  no episodes of dizziness/spinning today   Standing Horizontal Head Turns   Number of Reps  5   Symptom Description  no dizziness  Standing Vertical Head Turns   Number of Reps  5   Symptom Description  no dizziness   X1 Viewing Horizontal   Foot Position standing feet apart   Reps 2  reps horizontal, 1 rep vertical   Comments Patient begins to report a headache L frontal region with gaze x 1 adaptation exercises.                 PT Short Term Goals - 04/20/15 1353    PT SHORT TERM GOAL #1   Title The patient will have negative bilateral dix hallpike testing (to demo resolution of R posterior canalithiasis and R anterior canalithiasis).   Baseline Met on 04/20/2015 per report of no spinning or dizziness with brandt daroff or  rolling in the home.  Will formally reasses.   Time 4   Period Weeks   Status Achieved   PT SHORT TERM GOAL #2   Title The patient will be indep with HEP for gaze adaptation, habituation, and general mobility.   Baseline Met on 04/12/2015   Time 4   Period Weeks   Status Achieved   PT SHORT TERM GOAL #3   Title The patient will be further assessed for Berg balance test and goal to follow, as indicated.   Baseline Patient met on 04/12/2015 scoring 45/56.   Time 4   Period Weeks   Status Achieved   PT SHORT TERM GOAL #4   Title The patient will be further assessed for gait speed and goal to follow, as indicated.   Baseline 3.21 ft/sec on 04/12/2015   Time 4   Period Weeks   Status Achieved   PT SHORT TERM GOAL #5   Title The patient will perform rolling R<>left without nystagmus viewed or subjective reports of dizziness.   Baseline Met on 04/20/2015   Time 4   Period Weeks   Status Achieved           PT Long Term Goals - 04/20/15 1335    PT LONG TERM GOAL #1   Title The patient will be indep with HEP progression for balance, gait and general motion tolerance.   Baseline Target date 05/26/2015   Time 8   Period Weeks   Status On-going   PT LONG TERM GOAL #2   Title Patient will improve Berg to > or equal to 50/56.   Baseline Target date 05/26/2015   Time 8   Period Weeks   Status Revised   PT LONG TERM GOAL #3   Title Gait speed goal to follow   Baseline *patient a community ambulator per gait speed on 04/12/2015.   Time 8   Period Weeks   Status Deferred   PT LONG TERM GOAL #4   Title The patient will subjectively report vertigo improved by >50% since initial evaluation.   Baseline Target date 05/26/2015   Time 8   Period Weeks   Status On-going   PT LONG TERM GOAL #5   Title Improve DGI to > or equal to 21/24 for improved dynamic gait activities.   Baseline Target date 05/26/2015   Time 8   Period Weeks   Status On-going               Plan -  04/20/15 1443    Clinical Impression Statement The patient met STGs.  She continues with headaches after gaze adaptation exercises.  PT progressing HEP and recommended decrease to 1x/week to allow further time to work on motion tolerance  and gaze between sessions.  Also discussed return to driving and PT recommended she call MD to be cleared.   PT Treatment/Interventions Vestibular;Canalith Repostioning;Balance training;Neuromuscular re-education;Gait training;DME Instruction;Patient/family education;Stair training;Functional mobility training;Therapeutic activities;Therapeutic exercise;Manual techniques   PT Next Visit Plan progress habituation, gaze adaptation and HEP to tolerance.   Consulted and Agree with Plan of Care Patient;Family member/caregiver   Family Member Consulted Son present        Problem List Patient Active Problem List   Diagnosis Date Noted  . Postconcussive syndrome 03/02/2015  . Head injury 02/03/2015  . Fragile X associated tremor ataxia syndrome 10/10/2014  . Generalized epilepsy ([place]) 10/10/2014  . Dysphagia 10/10/2014  . Tremor 10/10/2014  . Fatigue 02/01/2014  . Accidental fall 02/01/2014  . RAD (reactive airway disease) 02/01/2014  . Microalbuminuria 02/01/2014  . Cataract 10/15/2013  . Acid reflux 09/13/2013  . Essential (primary) hypertension 09/13/2013  . Hypercholesteremia 09/13/2013  . Diabetes mellitus, type 2 ([place]) 09/13/2013  . TIA (transient ischemic attack) 08/27/2013  . Headache(784.0) 08/27/2013  . Avitaminosis D 04/06/2013  . Obesity 04/06/2013  . Allergic rhinitis 11/27/2012  . Anxiety state 11/27/2012  . Clinical depression 11/27/2012  . Bone/cartilage disorder 11/27/2012  . Fragile X syndrome 11/27/2012  . Absence of bladder continence 11/27/2012  . Arthralgia of hip or thigh 11/27/2012  . Cardiac conduction disorder 11/27/2012  . DS (disseminated sclerosis) ([place]) 11/27/2012    [place] , PT  04/20/2015, 2:44 PM  [facility] [address] [place], [place], [address] Phone: [phone]   Fax:  [phone]  Name: Brandi Ramsey MRN: [id number] Date of Birth: 01-30-1951

## 2015-04-20 NOTE — Telephone Encounter (Signed)
Pt /husband request her med notes/records info/call back @ [phone]

## 2015-04-23 ENCOUNTER — Ambulatory Visit: Payer: PPO | Admitting: Rehabilitative and Restorative Service Providers"

## 2015-04-23 ENCOUNTER — Telehealth: Payer: Self-pay | Admitting: *Deleted

## 2015-04-23 DIAGNOSIS — G40309 Generalized idiopathic epilepsy and epileptic syndromes, not intractable, without status epilepticus: Secondary | ICD-10-CM

## 2015-04-23 MED ORDER — PHENOBARBITAL 97.2 MG PO TABS: 97.2000 mg | ORAL_TABLET | Freq: Every day | ORAL | Status: DC

## 2015-04-23 NOTE — Telephone Encounter (Signed)
Spoke with husband. Records will be printed and mailed to patient.

## 2015-04-23 NOTE — Telephone Encounter (Signed)
Notified pt [facility],,,/lmb

## 2015-04-23 NOTE — Telephone Encounter (Signed)
done

## 2015-04-23 NOTE — Telephone Encounter (Signed)
Let msg on triage requesting refill on her phenobarbital.../lmb

## 2015-04-25 ENCOUNTER — Ambulatory Visit: Payer: PPO | Attending: Neurology | Admitting: Rehabilitative and Restorative Service Providers"

## 2015-04-25 ENCOUNTER — Encounter: Payer: PPO | Admitting: Rehabilitative and Restorative Service Providers"

## 2015-04-25 DIAGNOSIS — R269 Unspecified abnormalities of gait and mobility: Secondary | ICD-10-CM | POA: Insufficient documentation

## 2015-04-25 DIAGNOSIS — H8111 Benign paroxysmal vertigo, right ear: Secondary | ICD-10-CM | POA: Diagnosis present

## 2015-04-25 DIAGNOSIS — F0781 Postconcussional syndrome: Secondary | ICD-10-CM | POA: Diagnosis present

## 2015-04-25 NOTE — Therapy (Signed)
[facility] [address] [place], [place], [address] Phone: [phone]   Fax:  [phone]  Physical Therapy Treatment  Patient Details  Name: Brandi Ramsey MRN: [id number] Date of Birth: 1951-01-24 No Data Recorded  Encounter Date: 04/25/2015      PT End of Session - 04/25/15 0947    Visit Number 7   Number of Visits 16   Date for PT Re-Evaluation 05/26/15   Authorization Type private insurance with $30 copay, no visit limit per epic notes   PT Start Time 0940   PT Stop Time 1020   PT Time Calculation (min) 40 min   Activity Tolerance Patient tolerated treatment well   Behavior During Therapy [facility] for tasks assessed/performed      Past Medical History  Diagnosis Date  . Seizures ([place])   . Diabetes mellitus without complication ([place])   . Fragile X syndrome     ataxia syndrome.    [name] Hypertension   . Hemorrhoids   . Transient cerebral ischemia   . Vertigo, benign paroxysmal   . MS (multiple sclerosis) ([place])   . Cerebellar ataxia ([place])   . Stroke [facility])     Past Surgical History  Procedure Laterality Date  . Abdominal hysterectomy    . Fracture surgery      tib/fib (R) leg    There were no vitals filed for this visit.  Visit Diagnosis:  BPPV (benign paroxysmal positional vertigo), right  Post concussion syndrome      Subjective Assessment - 04/25/15 0942    Subjective The patient reports visual sensation of "fogginess"that is not clearing up.  She had a headache after our session last week and gaze exercise provokes headache.  She noted dizziness with brandt daroff yesterday.   Patient Stated Goals Return to driving and know I'm safe and improve balance.   Currently in Pain? No/denies             Vestibular Assessment - 04/25/15 0948    Positional Testing   Dix-Hallpike Dix-Hallpike Right;Dix-Hallpike Left   Sidelying Test Sidelying Right;Sidelying Left   Horizontal Canal Testing  Horizontal Canal Right;Horizontal Canal Left   Dix-Hallpike Right   Dix-Hallpike Right Duration 10 seconds   Dix-Hallpike Right Symptoms Upbeat, right rotatory nystagmus   Dix-Hallpike Left   Dix-Hallpike Left Duration 0   Dix-Hallpike Left Symptoms No nystagmus  *however in 2nd rep of R epley's toL, patient had mild nyst   Sidelying Right   Sidelying Right Duration 20   Sidelying Right Symptoms Upbeat, right rotatory nystagmus   Sidelying Left   Sidelying Left Duration none   Sidelying Left Symptoms No nystagmus   Horizontal Canal Right   Horizontal Canal Right Duration none   Horizontal Canal Right Symptoms Normal   Horizontal Canal Left   Horizontal Canal Left Duration none   Horizontal Canal Left Symptoms Normal          [facility] Adult PT Treatment/Exercise - 04/25/15 1245    Ambulation/Gait   Ambulation/Gait Yes   Ambulation/Gait Assistance 5: Supervision   Ambulation Distance (Feet) 345 Feet   Assistive device None         Vestibular Treatment/Exercise - 04/25/15 0001    Vestibular Treatment/Exercise   Vestibular Treatment Provided Canalith Repositioning;Gaze;Habituation   Canalith Repositioning Epley Manuever Right   Habituation Exercises --  *had patient stop doing HEP for habituation   Gaze Exercises X1 Viewing Horizontal;X1 Viewing Vertical    EPLEY MANUEVER RIGHT  Number of Reps  1   Overall Response Symptoms Resolved   Standing Horizontal Head Turns   Symptom Description  during gait with SBA for safety and mild dizziness after 10 reps   Standing Vertical Head Turns   Symptom Description  during gait with minimal dizziness x 10 reps   X1 Viewing Horizontal   Foot Position seated   Reps 2   Comments x 5 repetitions   X1 Viewing Vertical   Foot Position seated   Reps 2   Comments x 5 reps                PT Education - 04/25/15 1017    Education provided Yes   Education Details HEP: modified HEP to d/c habituation, kept gaze and balance  exercises   Person(s) Educated Patient   Methods Explanation;Demonstration;Handout   Comprehension Returned demonstration;Verbalized understanding          PT Short Term Goals - 04/20/15 1353    PT SHORT TERM GOAL #1   Title The patient will have negative bilateral dix hallpike testing (to demo resolution of R posterior canalithiasis and R anterior canalithiasis).   Baseline Met on 04/20/2015 per report of no spinning or dizziness with brandt daroff or rolling in the home.  Will formally reasses.   Time 4   Period Weeks   Status Achieved   PT SHORT TERM GOAL #2   Title The patient will be indep with HEP for gaze adaptation, habituation, and general mobility.   Baseline Met on 04/12/2015   Time 4   Period Weeks   Status Achieved   PT SHORT TERM GOAL #3   Title The patient will be further assessed for Berg balance test and goal to follow, as indicated.   Baseline Patient met on 04/12/2015 scoring 45/56.   Time 4   Period Weeks   Status Achieved   PT SHORT TERM GOAL #4   Title The patient will be further assessed for gait speed and goal to follow, as indicated.   Baseline 3.21 ft/sec on 04/12/2015   Time 4   Period Weeks   Status Achieved   PT SHORT TERM GOAL #5   Title The patient will perform rolling R<>left without nystagmus viewed or subjective reports of dizziness.   Baseline Met on 04/20/2015   Time 4   Period Weeks   Status Achieved           PT Long Term Goals - 04/20/15 1335    PT LONG TERM GOAL #1   Title The patient will be indep with HEP progression for balance, gait and general motion tolerance.   Baseline Target date 05/26/2015   Time 8   Period Weeks   Status On-going   PT LONG TERM GOAL #2   Title Patient will improve Berg to > or equal to 50/56.   Baseline Target date 05/26/2015   Time 8   Period Weeks   Status Revised   PT LONG TERM GOAL #3   Title Gait speed goal to follow   Baseline *patient a community ambulator per gait speed on  04/12/2015.   Time 8   Period Weeks   Status Deferred   PT LONG TERM GOAL #4   Title The patient will subjectively report vertigo improved by >50% since initial evaluation.   Baseline Target date 05/26/2015   Time 8   Period Weeks   Status On-going   PT LONG TERM GOAL #5   Title Improve DGI  to > or equal to 21/24 for improved dynamic gait activities.   Baseline Target date 05/26/2015   Time 8   Period Weeks   Status On-going               Plan - 04/25/15 1239    Clinical Impression Statement The patient had return of R BPPV noted through positive R sidelying test.  PT treated BPPV again today with Epley's maneuver and modified HEP to not include habituation (to allow time for otoconia to settle after repositioning).   PT Next Visit Plan RECHECK BPPV; progress habituation, gaze adaptation and HEP to tolerance.   Consulted and Agree with Plan of Care Patient;Family member/caregiver   Family Member Consulted daughter in law        Problem List Patient Active Problem List   Diagnosis Date Noted  . Postconcussive syndrome 03/02/2015  . Head injury 02/03/2015  . Fragile X associated tremor ataxia syndrome 10/10/2014  . Generalized epilepsy ([place]) 10/10/2014  . Dysphagia 10/10/2014  . Tremor 10/10/2014  . Fatigue 02/01/2014  . Accidental fall 02/01/2014  . RAD (reactive airway disease) 02/01/2014  . Microalbuminuria 02/01/2014  . Cataract 10/15/2013  . Acid reflux 09/13/2013  . Essential (primary) hypertension 09/13/2013  . Hypercholesteremia 09/13/2013  . Diabetes mellitus, type 2 ([place]) 09/13/2013  . TIA (transient ischemic attack) 08/27/2013  . Headache(784.0) 08/27/2013  . Avitaminosis D 04/06/2013  . Obesity 04/06/2013  . Allergic rhinitis 11/27/2012  . Anxiety state 11/27/2012  . Clinical depression 11/27/2012  . Bone/cartilage disorder 11/27/2012  . Fragile X syndrome 11/27/2012  . Absence of bladder continence 11/27/2012  . Arthralgia of hip or thigh  11/27/2012  . Cardiac conduction disorder 11/27/2012  . DS (disseminated sclerosis) ([place]) 11/27/2012    [place], PT 04/25/2015, 1:12 PM  [facility] [address] [place], [place], [address] Phone: [phone]   Fax:  [phone]  Name: LEMMA TETRO MRN: [id number] Date of Birth: 09-22-1950

## 2015-04-25 NOTE — Patient Instructions (Signed)
Feet Apart, Varied Arm Positions - Eyes Closed    In the corner with a chair in front of you. Stand with feet shoulder width apart and arms at your side. Close eyes and visualize upright position. Hold _30___ seconds. Repeat _3___ times per session. Do __1-2__ sessions per day.  Copyright  VHI. All rights reserved.  Feet Apart, Head Motion - Eyes Open    With eyes open, feet apart, move head slowly: side to side. Repeat _5___ times per session. Do __1-2__ sessions per day.   Gaze Stabilization: Tip Card 1.Target must remain in focus, not blurry, and appear stationary while head is in motion. 2.Perform exercises with small head movements (45 to either side of midline). 3.Increase speed of head motion so long as target is in focus. 4.If you wear eyeglasses, be sure you can see target through lens (therapist will give specific instructions for bifocal / progressive lenses). 5.These exercises may provoke dizziness or nausea. Work through these symptoms. If too dizzy, slow head movement slightly. Rest between each exercise. 6.Exercises demand concentration; avoid distractions. 7.For safety, perform standing exercises close to a counter, wall, corner, or next to someone.  Copyright  VHI. All rights reserved.  Gaze Stabilization: Standing Feet Apart *ONLY DO IN SEATED POSITION*   Feet shoulder width apart, keeping eyes on target on wall 3 feet away, tilt head down slightly and move head side to side for 5 TIMES. Repeat while moving head up and down for 5 TIMES. Do 2 sessions per day.  Copyright  VHI. All rights reserved.   Special Instructions: Exercises may bring on mild to moderate symptoms of nausea, dizziness that resolve within 30 minutes of completing exercises. If symptoms are lasting longer than 30 minutes, modify your exercises by: >decreasing the # of times you complete each activity >ensuring your symptoms return to baseline before moving onto the next  exercise >dividing up exercises so you do not do them all in one session, but multiple short sessions throughout the day >doing them once a day until symptoms improve

## 2015-05-04 ENCOUNTER — Ambulatory Visit: Payer: PPO | Admitting: Rehabilitative and Restorative Service Providers"

## 2015-05-09 ENCOUNTER — Other Ambulatory Visit: Payer: Self-pay | Admitting: Internal Medicine

## 2015-05-10 ENCOUNTER — Ambulatory Visit (INDEPENDENT_AMBULATORY_CARE_PROVIDER_SITE_OTHER): Payer: PPO | Admitting: Family Medicine

## 2015-05-10 ENCOUNTER — Encounter: Payer: Self-pay | Admitting: Family Medicine

## 2015-05-10 ENCOUNTER — Other Ambulatory Visit: Payer: Self-pay | Admitting: Internal Medicine

## 2015-05-10 VITALS — BP 132/80 | HR 81

## 2015-05-10 DIAGNOSIS — F0781 Postconcussional syndrome: Secondary | ICD-10-CM

## 2015-05-10 MED ORDER — DULOXETINE HCL 30 MG PO CPEP: 30.0000 mg | ORAL_CAPSULE | Freq: Every day | ORAL | Status: DC

## 2015-05-10 MED ORDER — CLONAZEPAM 1 MG PO TABS: 1.0000 mg | ORAL_TABLET | Freq: Two times a day (BID) | ORAL | Status: DC | PRN

## 2015-05-10 NOTE — Telephone Encounter (Signed)
Pt requesting refill for DULoxetine (CYMBALTA) 30 MG capsule [id number]  [facility] [address] [facility] [place] A request was also put in from pharmacy for clonazePAM (KLONOPIN) 1 MG tablet [id number] that was sent to [facility]. Wondering if Dr. Ronnald Ramp wants to fill that one also

## 2015-05-10 NOTE — Telephone Encounter (Signed)
Ok to fill 

## 2015-05-10 NOTE — Assessment & Plan Note (Signed)
Patient seems to have resolved the postconcussive syndrome at this time. TEST were normal today. No signs or even significant nice tenderness today. His lungs patient does significantly well we like to continue to monitor. I do think the patient's underlying fragile X associated tremor ataxia syndrome as well as the possibility of the eye pathology could be contribute in. Patient and will come back and see me again on an as-needed basis. I do feel that patient could do well with possibly short drives at this time.

## 2015-05-10 NOTE — Patient Instructions (Signed)
Good to see you Continue to be your self I know once the eye is good you will do great See me again when you need me Happy holidays!

## 2015-05-10 NOTE — Telephone Encounter (Signed)
Rx called in to pt pharmacy.

## 2015-05-10 NOTE — Progress Notes (Signed)
[facility] [place], [place] [address] Phone: [phone] Subjective:    I'm seeing this patient by the request  of:  Brandi Likens MD  CC: Post concussion.   [id number] Brandi Ramsey is a 64 y.o. female coming in with complaint of  Date of concussion: 01/29/2015  She was initially seen in the emergency department for contusion on her head. Patient had a CT of the cervical spine as well as a CT of the head at that time. These were reviewed by me. Patient did have some cervical spine spondylosis but otherwise fairly unremarkable.  Patient was seen by me and has been treated for more of a postconcussive syndrome. Continued to have some difficult he with balance and coordination. Patient has been sent to formal physical therapy. Patient has gone 3 times since last visit. Patient statesshe continues on the gabapentin 200 mg at bedtime. We also gave patient high-dose vitamin D supplementation. Patient was to take iron as well as a regular basis. Patient has been going to formal physical therapy for vestibular training. Patient has done very well. Patient states that she's doing improvement but continued to have more of a blurry vision in her left eye. Patient did see a ophthalmologist and they feel there is distortion and patient may need a surgical intervention. They're having another test next week. Patient states that the headaches has been significantly less severe. Patient states that the nystagmus in the eyes is improved. Patient has been doing some mild driving from time to time and is not having any difficulty. No new symptom.   Past medical history significant for a transient ischemic attack as well as depression and migraines with remote history of epilepsy   Past Medical History  Diagnosis Date  . Seizures ([place])   . Diabetes mellitus without complication ([place])   . Fragile X syndrome     ataxia syndrome.    [name] Hypertension   . Hemorrhoids   .  Transient cerebral ischemia   . Vertigo, benign paroxysmal   . MS (multiple sclerosis) ([place])   . Cerebellar ataxia ([place])   . Stroke [facility])    Past Surgical History  Procedure Laterality Date  . Abdominal hysterectomy    . Fracture surgery      tib/fib (R) leg   Social History  Substance Use Topics  . Smoking status: Never Smoker   . Smokeless tobacco: Never Used  . Alcohol Use: No   Allergies  Allergen Reactions  . Buprenorphine Hcl Hives and Itching  . Meperidine Hives and Itching    Other reaction(s): HIVES,ITCHING  . Morphine And Related Hives and Itching  . Pentazocine Anaphylaxis  . Pentazocine Lactate Anaphylaxis  . Phenytoin Hives    Other reaction(s): RASH  . Demerol Hives and Itching  . Dilaudid [Hydromorphone Hcl]     Hallucinations and felt crazy   . Doxycycline Hyclate     Other reaction(s): GI UPSET,NAUSEA,VOMITING  . Hydromorphone     Altered mental status  . Ibuprofen     Contraindicated for risk of GI bleeding.  [name] Phenytoin Sodium Extended Hives  . Codeine Rash    When mixed with Demerol Patient reports no reaction with Codeine/APAP (Tylenol #3)   Family History  Problem Relation Age of Onset  . Stroke    . Fragile X syndrome Son     Patient is carrier  . Raynaud syndrome Mother   . Alzheimer's disease Father   . Diabetes Father   .  Diabetes Sister   . Cancer Brother     brother  . Cancer Maternal Grandmother     cervical & breast   . Heart failure Maternal Grandfather   . Diabetes Paternal Grandmother        Past medical history, social, surgical and family history all reviewed in electronic medical record.   Review of Systems: No headache, visual changes, nausea, vomiting, diarrhea, constipation, dizziness, abdominal pain, skin rash, fevers, chills, night sweats, weight loss, swollen lymph nodes, body aches, joint swelling, muscle aches, chest pain, shortness of breath, mood changes.   Objective Blood pressure 132/80, pulse 81, SpO2  98 %.  General: No apparent distress alert and oriented x3 mood and affect normal, dressed appropriately.  HEENT: Pupils equal, extraocular movements intact  Respiratory: Patient's speak in full sentences and does not appear short of breath  Cardiovascular: No lower extremity edema, non tender, no erythema  Skin: Warm dry intact with no signs of infection or rash on extremities or on axial skeleton.  Abdomen: Soft nontender  Neuro: Cranial nerves II through XII are intact, neurovascularly intact in all extremities with 2+ DTRs and 2+ pulses.  Lymph: No lymphadenopathy of posterior or anterior cervical chain or axillae bilaterally.  Gait normal with good balance and coordination.  MSK:  Non tender with full range of motion and good stability and symmetric strength and tone of shoulders, elbows, wrist, hip, knee and ankles bilaterally.  Neck: Inspection unremarkable. No palpable stepoffs. Negative Spurling's maneuver. Full neck range of motion Grip strength and sensation normal in bilateral hands Strength good C4 to T1 distribution No sensory change to C4 to T1 Negative Hoffman sign bilaterally Reflexes normal   Concussion screening protocol has patient cleared with minimal nystagmus.  Pass serial 7, recall and repetitive movement with no problem.        Impression and Recommendations:     This case required medical decision making of moderate complexity.

## 2015-05-11 ENCOUNTER — Ambulatory Visit: Payer: PPO | Admitting: Rehabilitative and Restorative Service Providers"

## 2015-05-11 DIAGNOSIS — F0781 Postconcussional syndrome: Secondary | ICD-10-CM

## 2015-05-11 DIAGNOSIS — H8111 Benign paroxysmal vertigo, right ear: Secondary | ICD-10-CM | POA: Diagnosis not present

## 2015-05-11 DIAGNOSIS — R269 Unspecified abnormalities of gait and mobility: Secondary | ICD-10-CM

## 2015-05-11 NOTE — Therapy (Signed)
[facility] [address] [place], [place], [address] Phone: [phone]   Fax:  [phone]  Physical Therapy Treatment  Patient Details  Name: Brandi Ramsey MRN: [id number] Date of Birth: Sep 17, 1950 No Data Recorded  Encounter Date: 05/11/2015      PT End of Session - 05/11/15 1529    Visit Number 8   Number of Visits 16   Date for PT Re-Evaluation 05/26/15   Authorization Type private insurance with $30 copay, no visit limit per epic notes   PT Start Time 1450   PT Stop Time 1530   PT Time Calculation (min) 40 min   Activity Tolerance Patient tolerated treatment well   Behavior During Therapy [facility] for tasks assessed/performed      Past Medical History  Diagnosis Date  . Seizures ([place])   . Diabetes mellitus without complication ([place])   . Fragile X syndrome     ataxia syndrome.    [name] Hypertension   . Hemorrhoids   . Transient cerebral ischemia   . Vertigo, benign paroxysmal   . MS (multiple sclerosis) ([place])   . Cerebellar ataxia ([place])   . Stroke [facility])     Past Surgical History  Procedure Laterality Date  . Abdominal hysterectomy    . Fracture surgery      tib/fib (R) leg    There were no vitals filed for this visit.  Visit Diagnosis:  Post concussion syndrome  Abnormality of gait      Subjective Assessment - 05/11/15 1447    Subjective The patient reports she saw her eye doctor yesterday.  He thinks that there has been a shift in her left eye since consussion/trauma.  She sees MD for eye doctor on 05/24/2015.        SELF CARE/HOME MANAGEMENT: Discussed options for PT and current HEP.  Recommended patient hold exercises until after further eye evaluation as gaze fixation may not improve if further diagnostics need to be performed. Discussed f/u with PT by phone (PT to wait to do d/c as patient is unsure of next steps) and schedule appt if indicated. Discussed general mobility with patient  continuing to increase walking speed and distance for exercise. Recommended patient continue to progress current ambulation/walking Dizziness discussed and patient feels majority of symptoms resolved--she has had 2 brief mild episodes that did not remain.  THERAPEUTIC EXERCISE: Patient c/o continuing neck discomfort. PT provided HEP for neck stretching (patient has guarded since concussion due to vertigo): Towel roll scapular extension stretch Upper trapezius stretch Shoulder rolls for neck tension        PT Education - 05/11/15 1528    Education provided Yes   Education Details CLE:[id number] roll, neck tension stretch, towel roll stretch.   Person(s) Educated Patient   Methods Explanation;Demonstration;Handout   Comprehension Returned demonstration;Verbalized understanding          PT Short Term Goals - 04/20/15 1353    PT SHORT TERM GOAL #1   Title The patient will have negative bilateral dix hallpike testing (to demo resolution of R posterior canalithiasis and R anterior canalithiasis).   Baseline Met on 04/20/2015 per report of no spinning or dizziness with brandt daroff or rolling in the home.  Will formally reasses.   Time 4   Period Weeks   Status Achieved   PT SHORT TERM GOAL #2   Title The patient will be indep with HEP for gaze adaptation, habituation, and general mobility.   Baseline Met on 04/12/2015  Time 4   Period Weeks   Status Achieved   PT SHORT TERM GOAL #3   Title The patient will be further assessed for Berg balance test and goal to follow, as indicated.   Baseline Patient met on 04/12/2015 scoring 45/56.   Time 4   Period Weeks   Status Achieved   PT SHORT TERM GOAL #4   Title The patient will be further assessed for gait speed and goal to follow, as indicated.   Baseline 3.21 ft/sec on 04/12/2015   Time 4   Period Weeks   Status Achieved   PT SHORT TERM GOAL #5   Title The patient will perform rolling R<>left without nystagmus viewed or  subjective reports of dizziness.   Baseline Met on 04/20/2015   Time 4   Period Weeks   Status Achieved           PT Long Term Goals - 05/11/15 1530    PT LONG TERM GOAL #1   Title The patient will be indep with HEP progression for balance, gait and general motion tolerance.   Baseline Target date 05/26/2015   Time 8   Period Weeks   Status On-going   PT LONG TERM GOAL #2   Title Patient will improve Berg to > or equal to 50/56.   Baseline Target date 05/26/2015   Time 8   Period Weeks   Status On-going   PT LONG TERM GOAL #3   Title Gait speed goal to follow   Baseline *patient a community ambulator per gait speed on 04/12/2015.   Time 8   Period Weeks   Status Deferred   PT LONG TERM GOAL #4   Title The patient will subjectively report vertigo improved by >50% since initial evaluation.   Baseline Patient reports vertigo significantly improved.   Time 8   Period Weeks   Status Achieved   PT LONG TERM GOAL #5   Title Improve DGI to > or equal to 21/24 for improved dynamic gait activities.   Baseline Target date 05/26/2015   Time 8   Period Weeks   Status On-going               Plan - 05/11/15 1530    Clinical Impression Statement The patient is going to undergo further eye evaluation and may need a procedure to correct for visual issues.  PT to hold until after further eye evaluation, as gaze activities may not be indicated at this time.   PT Next Visit Plan check neck HEP, check gaze if needed, discharge or renew.   Consulted and Agree with Plan of Care Patient;Family member/caregiver   Family Member Consulted husband        Problem List Patient Active Problem List   Diagnosis Date Noted  . Postconcussive syndrome 03/02/2015  . Head injury 02/03/2015  . Fragile X associated tremor ataxia syndrome 10/10/2014  . 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 Dysphagia 10/10/2014  . Tremor 10/10/2014  . Fatigue 02/01/2014  . Accidental fall 02/01/2014  .  RAD (reactive airway disease) 02/01/2014  . Microalbuminuria 02/01/2014  . Cataract 10/15/2013  . Acid reflux 09/13/2013  . Essential (primary) hypertension 09/13/2013  . Hypercholesteremia 09/13/2013  . Diabetes mellitus, type 2 ([place]) 09/13/2013  . TIA (transient ischemic attack) 08/27/2013  . Headache(784.0) 08/27/2013  . Avitaminosis D 04/06/2013  . Obesity 04/06/2013  . Allergic rhinitis 11/27/2012  . Anxiety state 11/27/2012  . Clinical depression 11/27/2012  . Bone/cartilage disorder 11/27/2012  .  Fragile X syndrome 11/27/2012  . Absence of bladder continence 11/27/2012  . Arthralgia of hip or thigh 11/27/2012  . Cardiac conduction disorder 11/27/2012  . DS (disseminated sclerosis) ([place]) 11/27/2012    [place], PT 05/11/2015, 3:31 PM  [facility] [address] [place], [place], [address] Phone: [phone]   Fax:  [phone]  Name: Brandi Ramsey MRN: [id number] Date of Birth: 11-Mar-1951

## 2015-05-11 NOTE — Patient Instructions (Signed)
Healthy Back - Shoulder Roll    Stand straight with arms relaxed at sides. Roll shoulders backward continuously. Do __10__ times.   Copyright  VHI. All rights reserved.  Thoracic Self-Mobilization (Supine)    With rolled towel placed lengthwise at lower ribs level, lie back on towel with arms outstretched. Hold __2 minutes. Relax. Repeat __1__ times per set.  Do _1-2___ sessions per day.  http://orth.exer.us/1001   Copyright  VHI. All rights reserved.  Upper Limb Neural Tension I, Standing    Stand, one hand over top of head--the other hand can be by your side. Turn head down toward pulling side. Gently increase stretch by pulling on head and depressing opposite (stretch-side) shoulder blade. Hold _20__ seconds. Repeat _3__ times per session. Do _2__ sessions per day.  Copyright  VHI. All rights reserved.

## 2015-05-22 ENCOUNTER — Other Ambulatory Visit: Payer: Self-pay | Admitting: Internal Medicine

## 2015-05-30 ENCOUNTER — Telehealth: Payer: Self-pay | Admitting: Internal Medicine

## 2015-05-30 NOTE — Telephone Encounter (Signed)
Pt is requesting a referral to [facility], Dr. Levonne Lapping or [place].  She had her throat scoped quite some time ago and she is wanting to have it done again. They have an opening tomorrow 12/8 at 230 and she is needing the referral to get that appointment

## 2015-05-31 ENCOUNTER — Other Ambulatory Visit: Payer: Self-pay | Admitting: Internal Medicine

## 2015-05-31 DIAGNOSIS — J370 Chronic laryngitis: Secondary | ICD-10-CM | POA: Insufficient documentation

## 2015-05-31 NOTE — Telephone Encounter (Signed)
done

## 2015-05-31 NOTE — Telephone Encounter (Signed)
Please advise 

## 2015-06-11 ENCOUNTER — Ambulatory Visit: Payer: PPO | Admitting: Neurology

## 2015-06-11 ENCOUNTER — Other Ambulatory Visit: Payer: Self-pay | Admitting: Internal Medicine

## 2015-06-15 ENCOUNTER — Ambulatory Visit: Payer: PPO | Admitting: Internal Medicine

## 2015-07-05 DIAGNOSIS — J449 Chronic obstructive pulmonary disease, unspecified: Secondary | ICD-10-CM | POA: Diagnosis not present

## 2015-07-05 DIAGNOSIS — R911 Solitary pulmonary nodule: Secondary | ICD-10-CM | POA: Diagnosis not present

## 2015-07-05 DIAGNOSIS — R05 Cough: Secondary | ICD-10-CM | POA: Diagnosis not present

## 2015-07-05 DIAGNOSIS — J45909 Unspecified asthma, uncomplicated: Secondary | ICD-10-CM | POA: Diagnosis not present

## 2015-07-17 DIAGNOSIS — E041 Nontoxic single thyroid nodule: Secondary | ICD-10-CM | POA: Diagnosis not present

## 2015-07-17 DIAGNOSIS — R490 Dysphonia: Secondary | ICD-10-CM | POA: Diagnosis not present

## 2015-07-17 DIAGNOSIS — K219 Gastro-esophageal reflux disease without esophagitis: Secondary | ICD-10-CM | POA: Diagnosis not present

## 2015-07-25 ENCOUNTER — Other Ambulatory Visit: Payer: Self-pay | Admitting: Internal Medicine

## 2015-07-26 ENCOUNTER — Ambulatory Visit: Payer: PPO | Admitting: Neurology

## 2015-07-28 ENCOUNTER — Other Ambulatory Visit: Payer: Self-pay | Admitting: Internal Medicine

## 2015-08-09 ENCOUNTER — Encounter: Payer: Self-pay | Admitting: Rehabilitative and Restorative Service Providers"

## 2015-08-09 NOTE — Therapy (Signed)
[facility] [address] [place], [place], [address] Phone: [phone]   Fax:  [phone]  Patient Details  Name: Brandi Ramsey MRN: [id number] Date of Birth: 1951/02/16 Referring Provider:  No ref. provider found  Encounter Date: last encounter 05/11/2015  PHYSICAL THERAPY DISCHARGE SUMMARY  Visits from Start of Care: 8  Current functional level related to goals / functional outcomes:     PT Short Term Goals - 04/20/15 1353    PT SHORT TERM GOAL #1   Title The patient will have negative bilateral dix hallpike testing (to demo resolution of R posterior canalithiasis and R anterior canalithiasis).   Baseline Met on 04/20/2015 per report of no spinning or dizziness with brandt daroff or rolling in the home.  Will formally reasses.   Time 4   Period Weeks   Status Achieved   PT SHORT TERM GOAL #2   Title The patient will be indep with HEP for gaze adaptation, habituation, and general mobility.   Baseline Met on 04/12/2015   Time 4   Period Weeks   Status Achieved   PT SHORT TERM GOAL #3   Title The patient will be further assessed for Berg balance test and goal to follow, as indicated.   Baseline Patient met on 04/12/2015 scoring 45/56.   Time 4   Period Weeks   Status Achieved   PT SHORT TERM GOAL #4   Title The patient will be further assessed for gait speed and goal to follow, as indicated.   Baseline 3.21 ft/sec on 04/12/2015   Time 4   Period Weeks   Status Achieved   PT SHORT TERM GOAL #5   Title The patient will perform rolling R<>left without nystagmus viewed or subjective reports of dizziness.   Baseline Met on 04/20/2015   Time 4   Period Weeks   Status Achieved         PT Long Term Goals - 05/11/15 1530    PT LONG TERM GOAL #1   Title The patient will be indep with HEP progression for balance, gait and general motion tolerance.   Baseline Target date 05/26/2015   Time 8   Period Weeks   Status On-going   PT LONG TERM GOAL #2   Title Patient will improve Berg to > or equal to 50/56.   Baseline Target date 05/26/2015   Time 8   Period Weeks   Status On-going   PT LONG TERM GOAL #3   Title Gait speed goal to follow   Baseline *patient a community ambulator per gait speed on 04/12/2015.   Time 8   Period Weeks   Status Deferred   PT LONG TERM GOAL #4   Title The patient will subjectively report vertigo improved by >50% since initial evaluation.   Baseline Patient reports vertigo significantly improved.   Time 8   Period Weeks   Status Achieved   PT LONG TERM GOAL #5   Title Improve DGI to > or equal to 21/24 for improved dynamic gait activities.   Baseline Target date 05/26/2015   Time 8   Period Weeks   Status On-going        Remaining deficits: Visual changes Headaches *patient was to f/u with MD before further therapy   Education / Equipment: HEP, home walking, activity progression.  Plan: Patient agrees to discharge.  Patient goals were partially met. Patient is being discharged due to meeting the stated rehab goals.  ?????  Thank you for the referral of this patient. Rudell Cobb, MPT  Kei Mcelhiney 08/09/2015, 10:38 AM  [facility] [address] [place], [place], [address] Phone: [phone]   Fax:  [phone]

## 2015-08-14 ENCOUNTER — Encounter: Payer: Self-pay | Admitting: Neurology

## 2015-08-14 ENCOUNTER — Ambulatory Visit (INDEPENDENT_AMBULATORY_CARE_PROVIDER_SITE_OTHER): Payer: PPO | Admitting: Neurology

## 2015-08-14 VITALS — BP 124/86 | HR 83 | Ht 64.0 in | Wt 229.0 lb

## 2015-08-14 DIAGNOSIS — G44219 Episodic tension-type headache, not intractable: Secondary | ICD-10-CM

## 2015-08-14 DIAGNOSIS — F329 Major depressive disorder, single episode, unspecified: Secondary | ICD-10-CM | POA: Diagnosis not present

## 2015-08-14 DIAGNOSIS — G40309 Generalized idiopathic epilepsy and epileptic syndromes, not intractable, without status epilepticus: Secondary | ICD-10-CM | POA: Diagnosis not present

## 2015-08-14 DIAGNOSIS — Q992 Fragile X chromosome: Secondary | ICD-10-CM

## 2015-08-14 DIAGNOSIS — G250 Essential tremor: Secondary | ICD-10-CM

## 2015-08-14 DIAGNOSIS — G119 Hereditary ataxia, unspecified: Secondary | ICD-10-CM

## 2015-08-14 DIAGNOSIS — F32A Depression, unspecified: Secondary | ICD-10-CM

## 2015-08-14 MED ORDER — VENLAFAXINE HCL ER 37.5 MG PO CP24: ORAL_CAPSULE | ORAL | Status: DC

## 2015-08-14 NOTE — Progress Notes (Signed)
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  She is accompanied by her husband who provides some history.   UPDATE: Posconcussive syndrome symptoms had resolved and he was discharged from [facility] office in November.  She still notes some posterior headaches.  She reported increased vision loss in left eye with eye twitching.  She saw her ophthalmologist who said she may need new laser cataract surgery.  There may have been possible damage after the fall.  She reports increased depression.  She is lethargic and has no desire to remain social.  She is tearful.  [name]  HISTORY: Her son has fragile X syndrome with severe autism.  About 20 years ago, she began experiencing dizzy spells.  She apparently had imaging of the head which was suspicious for possible multiple sclerosis.  She had lumbar puncture, which was negative.  About 10 years ago, she developed mild tremor and unsteadiness.  She was worked up by [facility], a fragile X specialist at [facility], who diagnosed her with fragile X-associated tremor ataxia syndrome.  Work up included genetic testing for FMR1 gene premutation.   She has had a progressive course over the years.  She will have dizzy spells, described as lightheadedness, which will cause falls.  She has fractured her wrists and leg.  She also has tremor of the hands, causing difficulty with writing.  She also developed dysphonia and was found to have tremor involving her "voice box".  She reports prior episodes of both bowel and bladder incontinence, which are rare.  She also reports poor depth perception.  For example, she may sometimes drive too close to the curb or she may accidentally bump into somebody when  walking through the grocery store aisle.  She reports increased fatigue and weight gain.  She has depression and mood swings.  She also reports short-term memory problems.  She also has problems with swallowing and has choked on some occasions, requiring the Heimlich maneuver.  She has a remote history of epilepsy.  She had convulsions as a baby and two generalized tonic-clonic seizures in her early twenties.  She has been on phenobarbital ever since.  No recurrent seizures.  She takes calcium 500mg  and vitamin D 600 units twice daily.  A DEXA scan was performed and was negative.  She continues on phenobarbital 97.5mg  at bedtime.  She also has history of migraine, described as severe pressure and throbbing, over her left eye.  In March 2015, she had such a headache associated with right sided facial numbness and lower facial weakness.  She was admitted to [facility] for [facility]IA.  CT of the head was unremarkable.  MRI of the brain showed periventricular and subcortical hyperintensities.  MRA of the head showed no major intracranial arterial stenosis, however there is question of mild narrowing of the left cavernous ICA.  Carotid duplex showed 1-39% bilateral ICA stenosis.  2D echo showed LVEF of 60-65% with moderate LVH.    She suffered postconcussion syndrome since slipping and hitting the back of her head in August 2016.  She says she passed afterwards for a few seconds.  Afterwards, she noted headache, spinning sensation, nausea and she vomited.  She went to the ED where CT of the head and cervical  spine showed no acute findings.  She was discharged home with meclizine and hydrocodone.  Since she has been home, she still notes constant dull posterior headache.  She takes ibuprofen for it daily but not the hydrocodone.  The dizziness is improving.  She has been more emotional and was crying easily.  She is seeing [facility] for management. Due to concerns of possible prior history of  MS, she underwent MRI of the brain and cervical spine with and without contrast on 03/15/15.  There was slight progression of nonspecific cerebral white matter changes, most probably chronic small vessel disease, compared to 2012.  Cervical spine showed no cord lesions.    PAST MEDICAL HISTORY: Past Medical History  Diagnosis Date  . Seizures ([place])   . Diabetes mellitus without complication ([place])   . Fragile X syndrome     ataxia syndrome.    [name] Hypertension   . Hemorrhoids   . Transient cerebral ischemia   . Vertigo, benign paroxysmal   . MS (multiple sclerosis) ([place])   . Cerebellar ataxia ([place])   . Stroke [facility])     MEDICATIONS: Current Outpatient Prescriptions on File Prior to Visit  Medication Sig Dispense Refill  . aspirin EC 81 MG EC tablet Take 1 tablet (81 mg total) by mouth daily.    [name] atorvastatin (LIPITOR) 80 MG tablet Take 1 tablet (80 mg total) by mouth daily at 6 PM. ---patient needs office visit before any further refills 90 tablet 0  . atorvastatin (LIPITOR) 80 MG tablet TAKE 1 TABLET (80 MG TOTAL) BY MOUTH AT BEDTIME. 90 tablet 1  . BREO ELLIPTA 100-25 MCG/INH AEPB Inhale 1 puff into the lungs daily.   11  . Calcium-Vitamin D 600-200 MG-UNIT per tablet Take 1 tablet by mouth 2 (two) times daily.    . clonazePAM (KLONOPIN) 1 MG tablet Take 1 tablet (1 mg total) by mouth 2 (two) times daily as needed for anxiety. 45 tablet 5  . Coenzyme Q10 (CO Q 10 PO) Take by mouth.    . [employer] CALCIUM 600+D 600-800 MG-UNIT TABS Take 1 tablet by mouth 2 (two) times daily with a meal.  3  . DULoxetine (CYMBALTA) 30 MG capsule Take 1 capsule (30 mg total) by mouth daily. 30 capsule 11  . DULoxetine (CYMBALTA) 30 MG capsule TAKE 1 CAPSULE EVERY DAY 30 capsule 3  . ferrous sulfate 325 (65 FE) MG tablet Take 325 mg by mouth daily with breakfast.    . magnesium oxide (MAG-OX) 400 MG tablet Take 400 mg by mouth daily.    . Melatonin 3 MG TABS Take by mouth.    . metoprolol succinate (TOPROL-XL)  25 MG 24 hr tablet Take 1 tablet (25 mg total) by mouth at bedtime. 90 tablet 0  . Omega-3 Fatty Acids (FISH OIL) 1200 MG CAPS Take by mouth.    . pantoprazole (PROTONIX) 40 MG tablet TAKE 1 TABLET (40 MG TOTAL) BY MOUTH 2 (TWO) TIMES DAILY. 60 tablet 3  . PHENobarbital (LUMINAL) 97.2 MG tablet Take 1 tablet (97.2 mg total) by mouth at bedtime. 30 tablet 5  . ranitidine (ZANTAC) 150 MG tablet Take 150 mg by mouth 2 (two) times daily as needed (for indigestion).     [name] tetrahydrozoline (VISINE) 0.05 % ophthalmic solution Place 2 drops into both eyes 5 (five) times daily as needed (for dry eyes).    . Vitamin D, Ergocalciferol, (DRISDOL) 50000 UNITS CAPS capsule Take 50,000 Units by mouth  once a week. Take on Fridays     No current facility-administered medications on file prior to visit.    ALLERGIES: Allergies  Allergen Reactions  . Buprenorphine Hcl Hives and Itching  . Meperidine Hives and Itching    Other reaction(s): HIVES,ITCHING  . Morphine And Related Hives and Itching  . Pentazocine Anaphylaxis  . Pentazocine Lactate Anaphylaxis  . Phenytoin Hives    Other reaction(s): RASH  . Demerol Hives and Itching  . Dilaudid [Hydromorphone Hcl]     Hallucinations and felt crazy   . Doxycycline Hyclate     Other reaction(s): GI UPSET,NAUSEA,VOMITING  . Hydromorphone     Altered mental status  . Ibuprofen     Contraindicated for risk of GI bleeding.  [name] Phenytoin Sodium Extended Hives  . Codeine Rash    When mixed with Demerol Patient reports no reaction with Codeine/APAP (Tylenol #3)    FAMILY HISTORY: Family History  Problem Relation Age of Onset  . Stroke    . Fragile X syndrome Son     Patient is carrier  . Raynaud syndrome Mother   . Alzheimer's disease Father   . Diabetes Father   . Diabetes Sister   . Cancer Brother     brother  . Cancer Maternal Grandmother     cervical & breast   . Heart failure Maternal Grandfather   . Diabetes Paternal Grandmother     SOCIAL  HISTORY: Social History   Social History  . Marital Status: Married    Spouse Name: N/A  . Number of Children: N/A  . Years of Education: N/A   Occupational History  . Not on file.   Social History Main Topics  . Smoking status: Never Smoker   . Smokeless tobacco: Never Used  . Alcohol Use: No  . Drug Use: No  . Sexual Activity:    Partners: Male   Other Topics Concern  . Not on file   Social History Narrative    REVIEW OF SYSTEMS: Constitutional: No fevers, chills, or sweats, no generalized fatigue, change in appetite Eyes: No visual changes, double vision, eye pain Ear, nose and throat: No hearing loss, ear pain, nasal congestion, sore throat Cardiovascular: No chest pain, palpitations Respiratory:  No shortness of breath at rest or with exertion, wheezes GastrointestinaI: No nausea, vomiting, diarrhea, abdominal pain, fecal incontinence Genitourinary:  No dysuria, urinary retention or frequency Musculoskeletal:  No neck pain, back pain Integumentary: No rash, pruritus, skin lesions Neurological: as above Psychiatric: depression, insomnia, anxiety Endocrine: No palpitations, fatigue, diaphoresis, mood swings, change in appetite, change in weight, increased thirst Hematologic/Lymphatic:  No anemia, purpura, petechiae. Allergic/Immunologic: no itchy/runny eyes, nasal congestion, recent allergic reactions, rashes  PHYSICAL EXAM: Filed Vitals:   08/14/15 0930  BP: 124/86  Pulse: 83   General: No acute distress.  Patient appears well-groomed.   Head:  Normocephalic/atraumatic Eyes:  Fundoscopic exam unremarkable without vessel changes, exudates, hemorrhages or papilledema. Neck: supple, no paraspinal tenderness, full range of motion Heart:  Regular rate and rhythm Lungs:  Clear to auscultation bilaterally Back: No paraspinal tenderness Neurological Exam: alert and oriented to person, place, and time. Attention span and concentration intact, recent and remote memory  intact, fund of knowledge intact.  Speech fluent and not dysarthric, language intact.  Nystagmus noted on tracking in all directions.  Otherwise, CN II-XII intact. Fundoscopic exam unremarkable without vessel changes, exudates, hemorrhages or papilledema.  Bulk and tone normal, muscle strength 5/5 throughout.  Sensation to light touch, temperature  and vibration intact.  Deep tendon reflexes 2+ throughout, toes downgoing.  Finger to nose with dysmetria and tremor, mild dysmetria with left heel to right shin.  Gait ataxic, unable to tandem walk, Romberg positive.  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  Will start venlafaxine ER in addition to Cymbalta to address depression and headache, titrate to 75mg  daily 2.  Refer to psychiatry/psychology 3.  Phenobarbitol 97.5mg  daily with calcium 500mg  and vitamin D 600units twice daily 4.  ASA 5.  Follow up in 6 months.  28 minutes spent face to face with patient, over 50% spent discussing diagnosis and management.  Metta Clines, DO  CC:  Scarlette Calico, MD

## 2015-08-14 NOTE — Patient Instructions (Addendum)
1.  Will start venlafaxine ER 37.5mg .  Take 1 pill daily for 7 days then 2 pills daily.  It is an antidepressant that is helpful for headaches as well. 2.  Refer to psychiatry 3.  Follow up in 6 months. 4. Call in 4 weeks with medication update.

## 2015-08-15 ENCOUNTER — Other Ambulatory Visit: Payer: Self-pay | Admitting: Family Medicine

## 2015-08-17 NOTE — Telephone Encounter (Signed)
Pt is no longer taking this med. Refill denied.

## 2015-08-22 ENCOUNTER — Encounter: Payer: Self-pay | Admitting: Gastroenterology

## 2015-08-22 ENCOUNTER — Ambulatory Visit (INDEPENDENT_AMBULATORY_CARE_PROVIDER_SITE_OTHER): Payer: PPO | Admitting: Gastroenterology

## 2015-08-22 ENCOUNTER — Telehealth: Payer: Self-pay | Admitting: Gastroenterology

## 2015-08-22 ENCOUNTER — Other Ambulatory Visit (INDEPENDENT_AMBULATORY_CARE_PROVIDER_SITE_OTHER): Payer: PPO

## 2015-08-22 VITALS — BP 120/90 | HR 100 | Ht 64.0 in | Wt 227.6 lb

## 2015-08-22 DIAGNOSIS — K2100 Gastro-esophageal reflux disease with esophagitis, without bleeding: Secondary | ICD-10-CM

## 2015-08-22 DIAGNOSIS — K921 Melena: Secondary | ICD-10-CM

## 2015-08-22 DIAGNOSIS — K5900 Constipation, unspecified: Secondary | ICD-10-CM

## 2015-08-22 DIAGNOSIS — Z86010 Personal history of colonic polyps: Secondary | ICD-10-CM

## 2015-08-22 LAB — CBC WITH DIFFERENTIAL/PLATELET
BASOS ABS: 0.1 10*3/uL (ref 0.0–0.1)
Basophils Relative: 0.5 % (ref 0.0–3.0)
Eosinophils Absolute: 0.1 10*3/uL (ref 0.0–0.7)
Eosinophils Relative: 1.2 % (ref 0.0–5.0)
HEMATOCRIT: 42.2 % (ref 36.0–46.0)
HEMOGLOBIN: 14 g/dL (ref 12.0–15.0)
LYMPHS PCT: 22.3 % (ref 12.0–46.0)
Lymphs Abs: 2.2 10*3/uL (ref 0.7–4.0)
MCHC: 33.2 g/dL (ref 30.0–36.0)
MCV: 83.9 fl (ref 78.0–100.0)
MONOS PCT: 3.8 % (ref 3.0–12.0)
Monocytes Absolute: 0.4 10*3/uL (ref 0.1–1.0)
NEUTROS ABS: 7.2 10*3/uL (ref 1.4–7.7)
Neutrophils Relative %: 72.2 % (ref 43.0–77.0)
Platelets: 273 10*3/uL (ref 150.0–400.0)
RBC: 5.04 Mil/uL (ref 3.87–5.11)
RDW: 14.8 % (ref 11.5–15.5)
WBC: 10 10*3/uL (ref 4.0–10.5)

## 2015-08-22 LAB — COMPREHENSIVE METABOLIC PANEL
ALBUMIN: 4.1 g/dL (ref 3.5–5.2)
ALK PHOS: 88 U/L (ref 39–117)
ALT: 21 U/L (ref 0–35)
AST: 18 U/L (ref 0–37)
BILIRUBIN TOTAL: 0.4 mg/dL (ref 0.2–1.2)
BUN: 15 mg/dL (ref 6–23)
CALCIUM: 9.5 mg/dL (ref 8.4–10.5)
CO2: 25 mEq/L (ref 19–32)
Chloride: 103 mEq/L (ref 96–112)
Creatinine, Ser: 0.93 mg/dL (ref 0.40–1.20)
GFR: 64.59 mL/min (ref >60.00)
Glucose, Bld: 125 mg/dL — ABNORMAL HIGH (ref 70–99)
Potassium: 4.2 mEq/L (ref 3.5–5.1)
Sodium: 136 mEq/L (ref 135–145)
TOTAL PROTEIN: 7.4 g/dL (ref 6.0–8.3)

## 2015-08-22 MED ORDER — LIDOCAINE (ANORECTAL) 5 % EX CREA: 1.0000 "application " | TOPICAL_CREAM | Freq: Two times a day (BID) | CUTANEOUS | Status: DC

## 2015-08-22 MED ORDER — METOCLOPRAMIDE HCL 10 MG PO TABS: ORAL_TABLET | ORAL | Status: DC

## 2015-08-22 MED ORDER — HYDROCORTISONE 2.5 % RE CREA: 1.0000 "application " | TOPICAL_CREAM | Freq: Two times a day (BID) | RECTAL | Status: DC

## 2015-08-22 MED ORDER — NA SULFATE-K SULFATE-MG SULF 17.5-3.13-1.6 GM/177ML PO SOLN: 1.0000 | Freq: Once | ORAL | Status: DC

## 2015-08-22 NOTE — Progress Notes (Signed)
    History of Present Illness: This is a 65 year old female referred by Janith Lima, MD for the evaluation of hematochezia, constipation and GERD. She relates problems with constipation, bright red blood per rectum with bowel movements, rectal pain and prolapsing hemorrhoids for several months. She began taking MiraLAX on a daily basis and her constipation has improved. Her rectal bleeding, prolapsing hemorrhoids and rectal pain however persist. She has chronic GERD and states she had an endoscopy at [place] about 7 years ago that showed erosive esophagitis. Reflux symptoms are well controlled on daily pantoprazole she takes ranitidine as needed for breakthrough symptoms. She has had chronic, stable problems with mild dysphagia that she states are felt secondary to her fragile X carrier state. She states she underwent colonoscopy at [facility] about 4-5 years ago and colon polyps were found and she received a letter to return for follow-up colonoscopy about a year or so ago but has not done so. Denies weight loss, abdominal pain, diarrhea, change in stool caliber, melena, nausea, vomiting, chest pain.  Review of Systems: Pertinent positive and negative review of systems were noted in the above HPI section. All other review of systems were otherwise negative.  Current Medications, Allergies, Past Medical History, Past Surgical History, Family History and Social History were reviewed in [employer] record.  Physical Exam: General: Well developed, well nourished, no acute distress Head: Normocephalic and atraumatic Eyes:  sclerae anicteric, EOMI Ears: Normal auditory acuity Mouth: No deformity or lesions Neck: Supple, no masses or thyromegaly Lungs: Clear throughout to auscultation Heart: Regular rate and rhythm; no murmurs, rubs or bruits Abdomen: Soft, non tender and non distended. No masses, hepatosplenomegaly or hernias noted. Normal Bowel sounds Rectal: Large  external hemorrhoids, no internal lesions, Hemoccult positive brown soft stool in the vault Musculoskeletal: Symmetrical with no gross deformities  Skin: No lesions on visible extremities Pulses:  Normal pulses noted Extremities: No clubbing, cyanosis, edema or deformities noted Neurological: Alert oriented x 4, grossly nonfocal Cervical Nodes:  No significant cervical adenopathy Inguinal Nodes: No significant inguinal adenopathy Psychological:  Alert and cooperative. Normal mood and affect  Assessment and Recommendations:  1. Hematochezia. Suspected bleeding from internal and external hemorrhoids. Rule out colorectal neoplasms. Begin 2.5% HC cream PR bid, Recticare bid and standard rectal care instructions. CBC, CMP today. Schedule colonoscopy. Metoclopramide before each prepped dose per patient concern that she anticipates a high potential for nausea and vomiting with the bowel prep.The risks (including bleeding, perforation, infection, missed lesions, medication reactions and possible hospitalization or surgery if complications occur), benefits, and alternatives to colonoscopy with possible biopsy and possible polypectomy were discussed with the patient and they consent to proceed. Consider surgical referral for further mgmt of hemorrhoids.   2. Personal history of colon polyps. Attempt to obtain records from [facility]. Schedule colonoscopy as above.  3. GERD with erosive esophagitis. Mild stable dysphagia felt to be related fragile X carrier state. Continue standard antireflux measures and pantoprazole 40 mg daily. Continue ranitidine 150 mg twice a day when necessary. Attempt to obtain records from [facility].  4. Constipation. High fiber diet along with adequate water intake and continue MiraLAX daily.   cc: Janith Lima, MD [address] [place], [place] [address]

## 2015-08-22 NOTE — Telephone Encounter (Signed)
Patient notified that she can't have solids on Thursday or Friday prior to her procedure.  I reviewed her procedure instructions with her again.  All questions answered

## 2015-08-22 NOTE — Patient Instructions (Signed)
Your physician has requested that you go to the basement for the following lab work before leaving today:CBC, [place].  We have sent the following medications to your pharmacy for you to pick up at your convenience:Proctozone, recticare, reglan.  You have been scheduled for a colonoscopy. Please follow written instructions given to you at your visit today.  Please pick up your prep supplies at the pharmacy within the next 1-3 days. If you use inhalers (even only as needed), please bring them with you on the day of your procedure. Your physician has requested that you go to www.startemmi.com and enter the access code given to you at your visit today. This web site gives a general overview about your procedure. However, you should still follow specific instructions given to you by our office regarding your preparation for the procedure.  Thank you for choosing me and [facility].  Pricilla Riffle. Dagoberto Ligas., MD., [facility]

## 2015-08-24 ENCOUNTER — Ambulatory Visit (AMBULATORY_SURGERY_CENTER): Payer: PPO | Admitting: Gastroenterology

## 2015-08-24 ENCOUNTER — Encounter: Payer: Self-pay | Admitting: Gastroenterology

## 2015-08-24 VITALS — BP 114/76 | HR 80 | Temp 97.8°F | Resp 15 | Ht 64.0 in | Wt 227.0 lb

## 2015-08-24 DIAGNOSIS — D128 Benign neoplasm of rectum: Secondary | ICD-10-CM

## 2015-08-24 DIAGNOSIS — Z86010 Personal history of colonic polyps: Secondary | ICD-10-CM | POA: Diagnosis not present

## 2015-08-24 DIAGNOSIS — D123 Benign neoplasm of transverse colon: Secondary | ICD-10-CM

## 2015-08-24 DIAGNOSIS — D122 Benign neoplasm of ascending colon: Secondary | ICD-10-CM | POA: Diagnosis not present

## 2015-08-24 DIAGNOSIS — K625 Hemorrhage of anus and rectum: Secondary | ICD-10-CM | POA: Diagnosis not present

## 2015-08-24 DIAGNOSIS — D125 Benign neoplasm of sigmoid colon: Secondary | ICD-10-CM | POA: Diagnosis not present

## 2015-08-24 DIAGNOSIS — E119 Type 2 diabetes mellitus without complications: Secondary | ICD-10-CM | POA: Diagnosis not present

## 2015-08-24 DIAGNOSIS — R51 Headache: Secondary | ICD-10-CM | POA: Diagnosis not present

## 2015-08-24 DIAGNOSIS — Z8673 Personal history of transient ischemic attack (TIA), and cerebral infarction without residual deficits: Secondary | ICD-10-CM | POA: Diagnosis not present

## 2015-08-24 DIAGNOSIS — K635 Polyp of colon: Secondary | ICD-10-CM | POA: Diagnosis not present

## 2015-08-24 DIAGNOSIS — F329 Major depressive disorder, single episode, unspecified: Secondary | ICD-10-CM | POA: Diagnosis not present

## 2015-08-24 DIAGNOSIS — G35 Multiple sclerosis: Secondary | ICD-10-CM | POA: Diagnosis not present

## 2015-08-24 DIAGNOSIS — K921 Melena: Secondary | ICD-10-CM | POA: Diagnosis not present

## 2015-08-24 DIAGNOSIS — R569 Unspecified convulsions: Secondary | ICD-10-CM | POA: Diagnosis not present

## 2015-08-24 DIAGNOSIS — K621 Rectal polyp: Secondary | ICD-10-CM

## 2015-08-24 DIAGNOSIS — J45909 Unspecified asthma, uncomplicated: Secondary | ICD-10-CM | POA: Diagnosis not present

## 2015-08-24 DIAGNOSIS — I10 Essential (primary) hypertension: Secondary | ICD-10-CM | POA: Diagnosis not present

## 2015-08-24 DIAGNOSIS — M797 Fibromyalgia: Secondary | ICD-10-CM | POA: Diagnosis not present

## 2015-08-24 DIAGNOSIS — E669 Obesity, unspecified: Secondary | ICD-10-CM | POA: Diagnosis not present

## 2015-08-24 MED ORDER — SODIUM CHLORIDE 0.9 % IV SOLN: 500.0000 mL | INTRAVENOUS | Status: DC

## 2015-08-24 NOTE — Patient Instructions (Signed)
YOU HAD AN ENDOSCOPIC PROCEDURE TODAY AT [facility]:   Refer to the procedure report that was given to you for any specific questions about what was found during the examination.  If the procedure report does not answer your questions, please call your gastroenterologist to clarify.  If you requested that your care partner not be given the details of your procedure findings, then the procedure report has been included in a sealed envelope for you to review at your convenience later.  YOU SHOULD EXPECT: Some feelings of bloating in the abdomen. Passage of more gas than usual.  Walking can help get rid of the air that was put into your GI tract during the procedure and reduce the bloating. If you had a lower endoscopy (such as a colonoscopy or flexible sigmoidoscopy) you may notice spotting of blood in your stool or on the toilet paper. If you underwent a bowel prep for your procedure, you may not have a normal bowel movement for a few days.  Please Note:  You might notice some irritation and congestion in your nose or some drainage.  This is from the oxygen used during your procedure.  There is no need for concern and it should clear up in a day or so.  SYMPTOMS TO REPORT IMMEDIATELY:   Following lower endoscopy (colonoscopy or flexible sigmoidoscopy):  Excessive amounts of blood in the stool  Significant tenderness or worsening of abdominal pains  Swelling of the abdomen that is new, acute  Fever of 100F or higher    For urgent or emergent issues, a gastroenterologist can be reached at any hour by calling [phone].   DIET: Your first meal following the procedure should be a small meal and then it is ok to progress to your normal diet. Heavy or fried foods are harder to digest and may make you feel nauseous or bloated.  Likewise, meals heavy in dairy and vegetables can increase bloating.  Drink plenty of fluids but you should avoid alcoholic beverages for 24  hours.  ACTIVITY:  You should plan to take it easy for the rest of today and you should NOT DRIVE or use heavy machinery until tomorrow (because of the sedation medicines used during the test).    FOLLOW UP: Our staff will call the number listed on your records the next business day following your procedure to check on you and address any questions or concerns that you may have regarding the information given to you following your procedure. If we do not reach you, we will leave a message.  However, if you are feeling well and you are not experiencing any problems, there is no need to return our call.  We will assume that you have returned to your regular daily activities without incident.  If any biopsies were taken you will be contacted by phone or by letter within the next 1-3 weeks.  Please call us at [phone] if you have not heard about the biopsies in 3 weeks.    SIGNATURES/CONFIDENTIALITY: You and/or your care partner have signed paperwork which will be entered into your electronic medical record.  These signatures attest to the fact that that the information above on your After Visit Summary has been reviewed and is understood.  Full responsibility of the confidentiality of this discharge information lies with you and/or your care-partner.  Polyp/ Internal Hemorrhoids handout given Await pathology results Hold NSAIDS medications for 2 weeks Resume diet and medications

## 2015-08-24 NOTE — Op Note (Signed)
[place]  [address] [place], [address]   COLONOSCOPY PROCEDURE REPORT  PATIENT: Brandi [facility], [facility]  MR#: [id number] BIRTHDATE: 12/27/1950 , 34  yrs. old GENDER: female ENDOSCOPIST: Ladene Artist, MD, [facility] REFERRED BY:  Janith Lima MD PROCEDURE DATE:  08/24/2015 PROCEDURE:   Colonoscopy, diagnostic, Colonoscopy with biopsy, and Colonoscopy with snare polypectomy First Screening Colonoscopy - Avg.  risk and is 50 yrs.  old or older - No.  Prior Negative Screening - Now for repeat screening. N/A  History of Adenoma - Now for follow-up colonoscopy & has been > or = to 3 yrs.  Yes hx of adenoma.  Has been 3 or more years since last colonoscopy.  Polyps removed today? Yes ASA CLASS:   Class III INDICATIONS:Evaluation of unexplained GI bleeding, hematochezia, and heme-positive stool. MEDICATIONS: Monitored anesthesia care, Propofol 400 mg IV, lidocaine 40 mg IV, and Versed 3 mg IV  DESCRIPTION OF PROCEDURE:   After the risks benefits and alternatives of the procedure were thoroughly explained, informed consent was obtained.  The digital rectal exam revealed external hemorrhoids.   The LB [id number]  endoscope was introduced through the anus and advanced to the cecum, which was identified by both the appendix and ileocecal valve. No adverse events experienced.   The quality of the prep was excellent.  (Suprep was used)  The instrument was then slowly withdrawn as the colon was fully examined. Estimated blood loss is zero unless otherwise noted in this procedure report.   COLON FINDINGS: Six sessile polyps measuring 6-7 mm in size were found in the transverse colon.  Polypectomies were performed with a cold snare.  The resection was complete, the polyp tissue was completely retrieved and sent to histology.   A sessile polyp measuring 4 mm in size was found in the ascending colon.  A polypectomy was performed with cold forceps.  The resection  was complete, the polyp tissue was completely retrieved and sent to histology.   A sessile polyp measuring 6 mm in size was found in the sigmoid colon.  A polypectomy was performed with a cold snare. The resection was complete, the polyp tissue was completely retrieved and sent to histology.   A pedunculated polyp measuring 10 mm in size was found in the distal rectum.  A polypectomy was performed using snare cautery.  The resection was complete, the polyp tissue was completely retrieved and sent to histology.   The examination was otherwise normal.  Retroflexed views revealed internal Grade I hemorrhoids. The time to cecum = 2.0 Withdrawal time = 17.0   The scope was withdrawn and the procedure completed. COMPLICATIONS: There were no immediate complications.  ENDOSCOPIC IMPRESSION: 1.   Six sessile polyps in the transverse colon; polypectomies performed with a cold snare 2.   Sessile polyp in the ascending colon; polypectomy performed with cold forceps 3.   Sessile polyp in the sigmoid colon; polypectomy performed with a cold snare 4.   Pedunculated polyp in the rectum; polypectomy performed using snare cautery 5.   Grade l internal hemorrhoids and external hemorrhoids  RECOMMENDATIONS: 1.  Hold Aspirin and all other NSAIDS for 2 weeks. 2.  Await pathology results 3.  Repeat colonoscopy in 2-3 years pending pathology review  eSigned:  Ladene Artist, MD, [facility] 08/24/2015 2:46 PM     PATIENT NAME:  Brandi [facility], Brandi [facility] MR#: [id number]

## 2015-08-24 NOTE — Progress Notes (Signed)
Stable to RR 

## 2015-08-24 NOTE — Progress Notes (Signed)
Called to room to assist during endoscopic procedure.  Patient ID and intended procedure confirmed with present staff. Received instructions for my participation in the procedure from the performing physician.  

## 2015-08-27 ENCOUNTER — Telehealth: Payer: Self-pay | Admitting: *Deleted

## 2015-08-27 ENCOUNTER — Telehealth: Payer: Self-pay

## 2015-08-27 ENCOUNTER — Telehealth: Payer: Self-pay | Admitting: Neurology

## 2015-08-27 DIAGNOSIS — Z961 Presence of intraocular lens: Secondary | ICD-10-CM | POA: Diagnosis not present

## 2015-08-27 DIAGNOSIS — H16223 Keratoconjunctivitis sicca, not specified as Sjogren's, bilateral: Secondary | ICD-10-CM | POA: Diagnosis not present

## 2015-08-27 DIAGNOSIS — H26492 Other secondary cataract, left eye: Secondary | ICD-10-CM | POA: Diagnosis not present

## 2015-08-27 DIAGNOSIS — I10 Essential (primary) hypertension: Secondary | ICD-10-CM | POA: Diagnosis not present

## 2015-08-27 DIAGNOSIS — H25093 Other age-related incipient cataract, bilateral: Secondary | ICD-10-CM | POA: Diagnosis not present

## 2015-08-27 MED ORDER — PANTOPRAZOLE SODIUM 40 MG PO TBEC: DELAYED_RELEASE_TABLET | ORAL | Status: DC

## 2015-08-27 NOTE — Telephone Encounter (Signed)
i have faxed rx request from [facility] for protonix---i don't see a recent wellness visit or another visit addressing this medication---please advise, thanks

## 2015-08-27 NOTE — Telephone Encounter (Signed)
  Follow up Call-  Call back number 08/24/2015  Post procedure Call Back phone  # [phone]  Permission to leave phone message Yes     Patient questions:  Do you have a fever, pain , or abdominal swelling? No. Pain Score  0 *  Have you tolerated food without any problems? Yes.    Have you been able to return to your normal activities? Yes.    Do you have any questions about your discharge instructions: Diet   No. Medications  No. Follow up visit  No.  Do you have questions or concerns about your Care? No.  Actions: * If pain score is 4 or above: No action needed, pain <4.

## 2015-08-27 NOTE — Telephone Encounter (Signed)
Pt wants to check the status of the referral please call [phone]

## 2015-08-27 NOTE — Telephone Encounter (Signed)
Refill sent She is due for OV

## 2015-08-28 ENCOUNTER — Other Ambulatory Visit: Payer: Self-pay | Admitting: Internal Medicine

## 2015-08-28 NOTE — Telephone Encounter (Signed)
Patient advised refill sent, and patient has scheduled march appt to see dr Ronnald Ramp

## 2015-08-29 NOTE — Telephone Encounter (Signed)
Referral placed on 08/14/15. Refaxed  to [employer] today to [phone]. Phone: [address], [place], [place] [address] Will contact clinic after opening hours to verify received faxed and appointment made. Will contact pt at later hour as well.

## 2015-09-04 DIAGNOSIS — H25093 Other age-related incipient cataract, bilateral: Secondary | ICD-10-CM | POA: Diagnosis not present

## 2015-09-04 LAB — HM DIABETES EYE EXAM

## 2015-09-05 ENCOUNTER — Other Ambulatory Visit (INDEPENDENT_AMBULATORY_CARE_PROVIDER_SITE_OTHER): Payer: PPO

## 2015-09-05 ENCOUNTER — Encounter: Payer: Self-pay | Admitting: Internal Medicine

## 2015-09-05 ENCOUNTER — Ambulatory Visit (INDEPENDENT_AMBULATORY_CARE_PROVIDER_SITE_OTHER): Payer: PPO | Admitting: Internal Medicine

## 2015-09-05 VITALS — BP 120/88 | HR 85 | Temp 97.6°F | Resp 16 | Ht 64.0 in | Wt 226.0 lb

## 2015-09-05 DIAGNOSIS — E785 Hyperlipidemia, unspecified: Secondary | ICD-10-CM

## 2015-09-05 DIAGNOSIS — J0100 Acute maxillary sinusitis, unspecified: Secondary | ICD-10-CM | POA: Diagnosis not present

## 2015-09-05 DIAGNOSIS — J0101 Acute recurrent maxillary sinusitis: Secondary | ICD-10-CM | POA: Insufficient documentation

## 2015-09-05 DIAGNOSIS — E118 Type 2 diabetes mellitus with unspecified complications: Secondary | ICD-10-CM

## 2015-09-05 DIAGNOSIS — Z23 Encounter for immunization: Secondary | ICD-10-CM

## 2015-09-05 LAB — LIPID PANEL
CHOL/HDL RATIO: 4
Cholesterol: 171 mg/dL (ref 0–200)
HDL: 44.6 mg/dL (ref >39.00)
LDL Cholesterol: 100 mg/dL — ABNORMAL HIGH (ref 0–99)
NONHDL: 126.54
TRIGLYCERIDES: 132 mg/dL (ref 0.0–149.0)
VLDL: 26.4 mg/dL (ref 0.0–40.0)

## 2015-09-05 LAB — URINALYSIS, ROUTINE W REFLEX MICROSCOPIC
BILIRUBIN URINE: NEGATIVE
HGB URINE DIPSTICK: NEGATIVE
KETONES UR: NEGATIVE
Leukocytes, UA: NEGATIVE
Nitrite: NEGATIVE
SPECIFIC GRAVITY, URINE: 1.02 (ref 1.000–1.030)
Total Protein, Urine: NEGATIVE
UROBILINOGEN UA: 0.2 (ref 0.0–1.0)
Urine Glucose: NEGATIVE
pH: 5.5 (ref 5.0–8.0)

## 2015-09-05 LAB — BASIC METABOLIC PANEL
BUN: 17 mg/dL (ref 6–23)
CALCIUM: 9.3 mg/dL (ref 8.4–10.5)
CO2: 27 meq/L (ref 19–32)
CREATININE: 0.86 mg/dL (ref 0.40–1.20)
Chloride: 103 mEq/L (ref 96–112)
GFR: 70.69 mL/min (ref >60.00)
Glucose, Bld: 115 mg/dL — ABNORMAL HIGH (ref 70–99)
Potassium: 3.9 mEq/L (ref 3.5–5.1)
SODIUM: 138 meq/L (ref 135–145)

## 2015-09-05 LAB — MICROALBUMIN / CREATININE URINE RATIO
CREATININE, U: 187.3 mg/dL
MICROALB UR: 1.4 mg/dL (ref 0.0–1.9)
Microalb Creat Ratio: 0.7 mg/g (ref 0.0–30.0)

## 2015-09-05 LAB — HEMOGLOBIN A1C: Hgb A1c MFr Bld: 7.2 % — ABNORMAL HIGH (ref 4.6–6.5)

## 2015-09-05 LAB — TSH: TSH: 0.94 u[IU]/mL (ref 0.35–4.50)

## 2015-09-05 MED ORDER — AMOXICILLIN 875 MG PO TABS: 875.0000 mg | ORAL_TABLET | Freq: Two times a day (BID) | ORAL | Status: AC

## 2015-09-05 NOTE — Patient Instructions (Signed)

## 2015-09-05 NOTE — Progress Notes (Signed)
Pre visit review using our clinic review tool, if applicable. No additional management support is needed unless otherwise documented below in the visit note. 

## 2015-09-05 NOTE — Progress Notes (Signed)
Subjective:  Patient ID: Brandi Ramsey, female    DOB: August 02, 1950  Age: 65 y.o. MRN: [id number]  CC: Diabetes; Hyperlipidemia; and Sinusitis   HPI Brandi Ramsey presents for a follow-up on diabetes and hypercholesterolemia.  She complains that she has not lost any weight and wants to be referred to consider bariatric surgery. She does not check her blood sugars but denies polyuria, polydipsia, polyphagia.  She also complains of a 5 day history of right facial pain, thick yellow nasal phlegm on the right side with low-grade fever, chills, nonproductive cough. She is taking Tylenol Cold and sinus and is getting some symptom relief. She does not want to take a narcotic cough suppressant. She has postnasal drip.  Outpatient Prescriptions Prior to Visit  Medication Sig Dispense Refill  . aspirin EC 81 MG EC tablet Take 1 tablet (81 mg total) by mouth daily.    [name] atorvastatin (LIPITOR) 80 MG tablet Take 1 tablet (80 mg total) by mouth daily at 6 PM. ---patient needs office visit before any further refills 90 tablet 0  . BREO ELLIPTA 100-25 MCG/INH AEPB Inhale 1 puff into the lungs daily.   11  . Calcium-Vitamin D 600-200 MG-UNIT per tablet Take 1 tablet by mouth 2 (two) times daily.    . clonazePAM (KLONOPIN) 1 MG tablet Take 1 tablet (1 mg total) by mouth 2 (two) times daily as needed for anxiety. 45 tablet 5  . Coenzyme Q10 (CO Q 10 PO) Take by mouth.    . [facility] CALCIUM 600+D 600-800 MG-UNIT TABS Take 1 tablet by mouth 2 (two) times daily with a meal. Reported on 08/24/2015  3  . DULoxetine (CYMBALTA) 30 MG capsule Take 1 capsule (30 mg total) by mouth daily. 30 capsule 11  . ferrous sulfate 325 (65 FE) MG tablet Take 325 mg by mouth daily with breakfast.    . hydrocortisone (PROCTOZONE-HC) 2.5 % rectal cream Place 1 application rectally 2 (two) times daily. Please add internal applicator 30 g 0  . magnesium oxide (MAG-OX) 400 MG tablet Take 400 mg by mouth daily. Reported on 08/24/2015    .  Melatonin 3 MG TABS Take by mouth.    . metoCLOPramide (REGLAN) 10 MG tablet Take as directed per prep instructions 2 tablet 0  . metoprolol succinate (TOPROL-XL) 25 MG 24 hr tablet TAKE 1 TABLET AT BEDTIME 90 tablet 3  . Omega-3 Fatty Acids (FISH OIL) 1200 MG CAPS Take by mouth.    . pantoprazole (PROTONIX) 40 MG tablet TAKE 1 TABLET (40 MG TOTAL) BY MOUTH 2 (TWO) TIMES DAILY. 60 tablet 3  . PHENobarbital (LUMINAL) 97.2 MG tablet Take 1 tablet (97.2 mg total) by mouth at bedtime. 30 tablet 5  . polyethylene glycol (MIRALAX / GLYCOLAX) packet Take 17 g by mouth daily.    . ranitidine (ZANTAC) 150 MG tablet Take 150 mg by mouth 2 (two) times daily as needed (for indigestion).     [name] tetrahydrozoline (VISINE) 0.05 % ophthalmic solution Place 2 drops into both eyes 5 (five) times daily as needed (for dry eyes).    . Vitamin D, Ergocalciferol, (DRISDOL) 50000 UNITS CAPS capsule Take 50,000 Units by mouth once a week. Reported on 08/24/2015    . DULoxetine (CYMBALTA) 30 MG capsule TAKE 1 CAPSULE EVERY DAY (Patient not taking: Reported on 08/24/2015) 30 capsule 3  . Lidocaine, Anorectal, (RECTICARE) 5 % CREA Place 1 application rectally 2 (two) times daily. (Patient not taking: Reported on 08/24/2015) 30 g  1   No facility-administered medications prior to visit.    ROS Review of Systems  Constitutional: Positive for chills and unexpected weight change. Negative for fever, diaphoresis, activity change, appetite change and fatigue.  HENT: Positive for congestion, postnasal drip, rhinorrhea and sinus pressure. Negative for ear pain, facial swelling, nosebleeds, sneezing, sore throat, trouble swallowing and voice change.   Eyes: Negative.   Respiratory: Positive for cough. Negative for choking, chest tightness, shortness of breath, wheezing and stridor.   Cardiovascular: Negative.  Negative for chest pain, palpitations and leg swelling.  Gastrointestinal: Negative.  Negative for nausea, vomiting, abdominal  pain, constipation and blood in stool.  Endocrine: Negative.  Negative for polydipsia, polyphagia and polyuria.  Genitourinary: Negative.   Musculoskeletal: Negative.  Negative for myalgias, back pain, joint swelling and arthralgias.  Skin: Negative.  Negative for color change and rash.  Allergic/Immunologic: Negative.   Neurological: Negative.  Negative for dizziness.  Hematological: Negative.  Negative for adenopathy. Does not bruise/bleed easily.  Psychiatric/Behavioral: Negative.     Objective:  BP 120/88 mmHg  Pulse 85  Temp(Src) 97.6 F (36.4 C) (Oral)  Resp 16  Ht 5\' 4"  (1.626 m)  Wt 226 lb (102.513 kg)  BMI 38.77 kg/m2  SpO2 95%  BP Readings from Last 3 Encounters:  09/05/15 120/88  08/24/15 114/76  08/22/15 120/90    Wt Readings from Last 3 Encounters:  09/05/15 226 lb (102.513 kg)  08/24/15 227 lb (102.967 kg)  08/22/15 227 lb 9.6 oz (103.239 kg)    Physical Exam  Constitutional: She is oriented to person, place, and time.  Non-toxic appearance. She does not have a sickly appearance. She does not appear ill. No distress.  HENT:  Right Ear: Tympanic membrane normal.  Left Ear: Tympanic membrane normal.  Nose: Rhinorrhea present. No mucosal edema or sinus tenderness. No epistaxis. Right sinus exhibits maxillary sinus tenderness. Right sinus exhibits no frontal sinus tenderness. Left sinus exhibits no maxillary sinus tenderness and no frontal sinus tenderness.  Mouth/Throat: Oropharynx is clear and moist and mucous membranes are normal. Mucous membranes are not pale, not dry and not cyanotic. No oral lesions. No trismus in the jaw. No uvula swelling. No oropharyngeal exudate, posterior oropharyngeal edema, posterior oropharyngeal erythema or tonsillar abscesses.  Eyes: Conjunctivae are normal. Right eye exhibits no discharge. Left eye exhibits no discharge. No scleral icterus.  Neck: Normal range of motion. Neck supple. No JVD present. No tracheal deviation present.  No thyromegaly present.  Cardiovascular: Normal rate, regular rhythm, normal heart sounds and intact distal pulses.  Exam reveals no gallop and no friction rub.   No murmur heard. Pulmonary/Chest: Effort normal and breath sounds normal. No stridor. No respiratory distress. She has no wheezes. She has no rales. She exhibits no tenderness.  Abdominal: Soft. Bowel sounds are normal. She exhibits no distension and no mass. There is no tenderness. There is no rebound and no guarding.  Musculoskeletal: Normal range of motion. She exhibits no edema or tenderness.  Lymphadenopathy:    She has no cervical adenopathy.  Neurological: She is oriented to person, place, and time.  Skin: Skin is warm and dry. No rash noted. She is not diaphoretic. No erythema. No pallor.  Vitals reviewed.   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 Dizziness. Posterior head and neck pain. Subsequent encounter. Creatinine was obtained on site at [facility] at [address]Results: Creatinine 1.0 mg/dL. EXAM: MRI HEAD WITHOUT AND WITH CONTRAST MRI CERVICAL SPINE WITHOUT AND WITH CONTRAST TECHNIQUE: Multiplanar, multiecho pulse sequences of the brain and surrounding structures, and cervical spine, to include the craniocervical junction and  cervicothoracic junction, were obtained without and with intravenous contrast. CONTRAST:  40mL MULTIHANCE GADOBENATE DIMEGLUMINE 529 MG/ML IV SOLN COMPARISON:  01/29/2015 CT.  08/27/2013 in 09/2009 2012 brain MR FINDINGS: MRI HEAD FINDINGS Some of the sequences are motion degraded. No acute infarct. No intracranial hemorrhage. Slight progression of moderate nonspecific white matter type changes most notable periventricular region. In a diabetic hypertensive patient of this age, findings may reflect result of small vessel disease. With history multiple sclerosis, demyelinating process is also consideration although appearance is not specific for such. None of these areas demonstrate enhancement or restricted motion as can be seen with areas of active demyelination. None of these areas demonstrate mass effect as can be seen with progressive multifocal leukoencephalopathy. No intracranial mass or abnormal enhancement. Hyperostosis frontalis interna incidentally noted. Mild global atrophy most notable frontal -temporal lobes without hydrocephalus. Major intracranial vascular structures are patent. Cervical medullary junction, pituitary region, pineal region and orbital structures unremarkable. Post lens replacement. No evidence of mesial temporal sclerosis. MRI CERVICAL SPINE FINDINGS Some of the sequences are motion degraded. Cervical medullary junction unremarkable. Mild motion artifact extends through the cervical cord without definitive focal cord signal abnormality. No abnormal enhancement. Mild kyphosis cervical spine centered at C5 level. Left C4 facet slightly anteriorly located. This appears at most minimally different from the 08/27/2013 examination. If there were any suspicion of instability, flexion/ extension views can be obtained for further delineation. There is no evidence of bony edema soft tissue edema to suggest acute osseous or soft tissue injury. C2-3:  Negative. C3-4: Shallow disc osteophyte  greater right paracentral position. Slight narrowing ventral aspect of the thecal sac without cord compression. C4-5: Shallow disc osteophyte complex slightly greater to right. Mild narrowing ventral aspect of the thecal sac. C5-6: Broad-based disc osteophyte complex greatest extension left paracentral position. Narrowing ventral aspect of the thecal sac greater on left with mild left-sided cord flattening. Left uncinate hypertrophy. Moderate left foraminal narrowing. C6-7: Broad-based disc osteophyte complex slightly greater to left. Narrowing ventral aspect of the thecal sac without cord compression. Mild foraminal narrowing greater on the left. C7-T1: Mild posterior element hypertrophy greater on the left. Slight impression dorsal aspect of the thecal sac greater on the left. IMPRESSION: MRI HEAD FINDINGS Some of the sequences are motion degraded. Slight progression of moderate nonspecific white matter type changes most notable periventricular region. In a diabetic hypertensive patient of this age, findings may reflect result of small vessel disease. With history multiple sclerosis, demyelinating process is also consideration although appearance is not specific for such. No acute infarct or intracranial hemorrhage. No intracranial  mass or abnormal enhancement. Mild global atrophy. MRI CERVICAL SPINE Some of the sequences are motion degraded. Mild motion artifact extends through the cervical cord without definitive focal cord signal abnormality. No abnormal enhancement. Mild kyphosis cervical spine centered at C5 level. Left C4 facet slightly anteriorly located. This appears at most minimally different from the 08/27/2013 examination. If there were any suspicion of instability, flexion/ extension views can be obtained for further delineation. There is no evidence of bony edema soft tissue edema to suggest acute osseous or soft tissue injury. Cervical spondylotic changes most notable C5-6 level. Summary of  pertinent findings includes: C3-4 shallow disc osteophyte greater right paracentral position. Slight narrowing ventral aspect of the thecal sac without cord compression. C4-5 shallow disc osteophyte complex slightly greater to right. Mild narrowing ventral aspect of the thecal sac. C5-6 broad-based disc osteophyte complex greatest extension left paracentral position. Narrowing ventral aspect of the thecal sac greater on left with mild left-sided cord flattening. Left uncinate hypertrophy. Moderate left foraminal narrowing. C6-7 broad-based disc osteophyte complex slightly greater to left. Narrowing ventral aspect of the thecal sac without cord compression. Mild foraminal narrowing greater on the left. C7-T1 mild posterior element hypertrophy greater on the left. Slight impression dorsal aspect of the thecal sac greater on the left. Electronically Signed   By: Genia Del M.D.   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 Dizziness. Posterior head and neck pain. Subsequent encounter. Creatinine was obtained on site at [facility] at [address]Results: Creatinine 1.0 mg/dL. EXAM: MRI HEAD WITHOUT AND WITH CONTRAST MRI CERVICAL SPINE WITHOUT AND WITH CONTRAST TECHNIQUE: Multiplanar, multiecho pulse sequences of the brain and surrounding structures, and cervical spine, to include the craniocervical junction and cervicothoracic junction, were obtained without and with intravenous contrast. CONTRAST:  59mL MULTIHANCE GADOBENATE DIMEGLUMINE 529 MG/ML IV SOLN COMPARISON:  01/29/2015 CT.  08/27/2013 in 09/2009 2012 brain MR FINDINGS: MRI HEAD FINDINGS Some of the sequences are motion degraded. No acute infarct. No intracranial hemorrhage. Slight progression of moderate nonspecific white matter type  changes most notable periventricular region. In a diabetic hypertensive patient of this age, findings may reflect result of small vessel disease. With history multiple sclerosis, demyelinating process is also consideration although appearance is not specific for such. None of these areas demonstrate enhancement or restricted motion as can be seen with areas of active demyelination. None of these areas demonstrate mass effect as can be seen with progressive multifocal leukoencephalopathy. No intracranial mass or abnormal enhancement. Hyperostosis frontalis interna incidentally noted. Mild global atrophy most notable frontal -temporal lobes without hydrocephalus. Major intracranial vascular structures are patent. Cervical medullary junction, pituitary region, pineal region and orbital structures unremarkable. Post lens replacement. No evidence of mesial temporal sclerosis. MRI CERVICAL SPINE FINDINGS Some of the sequences are motion degraded. Cervical medullary junction unremarkable. Mild motion artifact extends through the cervical cord without definitive focal cord signal abnormality. No abnormal enhancement. Mild kyphosis cervical spine centered at C5 level. Left C4 facet slightly anteriorly located. This appears at most minimally different from the 08/27/2013 examination. If there were any suspicion of instability, flexion/ extension views can be obtained for further delineation. There is no evidence of bony edema soft tissue edema to suggest acute osseous or soft tissue injury. C2-3:  Negative. C3-4: Shallow disc osteophyte greater right paracentral  position. Slight narrowing ventral aspect of the thecal sac without cord compression. C4-5: Shallow disc osteophyte complex slightly greater to right. Mild narrowing ventral aspect of the thecal sac. C5-6: Broad-based disc osteophyte complex greatest extension left paracentral position. Narrowing ventral aspect of the thecal sac greater on left with mild left-sided  cord flattening. Left uncinate hypertrophy. Moderate left foraminal narrowing. C6-7: Broad-based disc osteophyte complex slightly greater to left. Narrowing ventral aspect of the thecal sac without cord compression. Mild foraminal narrowing greater on the left. C7-T1: Mild posterior element hypertrophy greater on the left. Slight impression dorsal aspect of the thecal sac greater on the left. IMPRESSION: MRI HEAD FINDINGS Some of the sequences are motion degraded. Slight progression of moderate nonspecific white matter type changes most notable periventricular region. In a diabetic hypertensive patient of this age, findings may reflect result of small vessel disease. With history multiple sclerosis, demyelinating process is also consideration although appearance is not specific for such. No acute infarct or intracranial hemorrhage. No intracranial mass or abnormal enhancement. Mild global atrophy. MRI CERVICAL SPINE Some of the sequences are motion degraded. Mild motion artifact extends through the cervical cord without definitive focal cord signal abnormality. No abnormal enhancement. Mild kyphosis cervical spine centered at C5 level. Left C4 facet slightly anteriorly located. This appears at most minimally different from the 08/27/2013 examination. If there were any suspicion of instability, flexion/ extension views can be obtained for further delineation. There is no evidence of bony edema soft tissue edema to suggest acute osseous or soft tissue injury. Cervical spondylotic changes most notable C5-6 level. Summary of pertinent findings includes: C3-4 shallow disc osteophyte greater right paracentral position. Slight narrowing ventral aspect of the thecal sac without cord compression. C4-5 shallow disc osteophyte complex slightly greater to right. Mild narrowing ventral aspect of the thecal sac. C5-6 broad-based disc osteophyte complex greatest extension left paracentral position. Narrowing ventral aspect of the  thecal sac greater on left with mild left-sided cord flattening. Left uncinate hypertrophy. Moderate left foraminal narrowing. C6-7 broad-based disc osteophyte complex slightly greater to left. Narrowing ventral aspect of the thecal sac without cord compression. Mild foraminal narrowing greater on the left. C7-T1 mild posterior element hypertrophy greater on the left. Slight impression dorsal aspect of the thecal sac greater on the left. Electronically Signed   By: Genia Del M.D.   On: 03/16/2015 00:05    Assessment & Plan:   Brice was seen today for diabetes, hyperlipidemia and sinusitis.  Diagnoses and all orders for this visit:  Type 2 diabetes mellitus with complication, without long-term current use of insulin ([place])- her A1c is up to 7.2, she has normal renal function, she recently had a normal eye exam, with the increase in her blood sugar of asked her to start taking metformin -     Basic metabolic panel; Future -     Hemoglobin A1c; Future -     Urinalysis, Routine w reflex microscopic (not at [facility]); Future -     Microalbumin / creatinine urine ratio; Future -     Lipid panel; Future -     metFORMIN (GLUCOPHAGE) 500 MG tablet; Take 1 tablet (500 mg total) by mouth 2 (two) times daily with a meal.  Hyperlipidemia with target LDL less than 100- she has achieved her LDL goal is doing well on the statin -     Lipid panel; Future -     TSH; Future  Subacute maxillary sinusitis- she will continue Tylenol cold/sinus, will treat the  infection with amoxicillin. -     amoxicillin (AMOXIL) 875 MG tablet; Take 1 tablet (875 mg total) by mouth 2 (two) times daily.  Morbid obesity due to excess calories ([place]) -     Ambulatory referral to General Surgery  Need for Tdap vaccination -     Tdap vaccine greater than or equal to 7yo IM   I have discontinued Ms. Boda's Lidocaine (Anorectal). I am also having her start on amoxicillin and metFORMIN. Additionally, I am having her maintain her  Vitamin D (Ergocalciferol), Calcium-Vitamin D, ranitidine, tetrahydrozoline, CVS CALCIUM 600+D, BREO ELLIPTA, aspirin, Fish Oil, Coenzyme Q10 (CO Q 10 PO), magnesium oxide, Melatonin, ferrous sulfate, PHENobarbital, clonazePAM, DULoxetine, atorvastatin, polyethylene glycol, metoCLOPramide, hydrocortisone, pantoprazole, and metoprolol succinate.  Meds ordered this encounter  Medications  . amoxicillin (AMOXIL) 875 MG tablet    Sig: Take 1 tablet (875 mg total) by mouth 2 (two) times daily.    Dispense:  20 tablet    Refill:  0  . metFORMIN (GLUCOPHAGE) 500 MG tablet    Sig: Take 1 tablet (500 mg total) by mouth 2 (two) times daily with a meal.    Dispense:  180 tablet    Refill:  1     Follow-up: Return if symptoms worsen or fail to improve.  Scarlette Calico, MD

## 2015-09-06 ENCOUNTER — Encounter: Payer: Self-pay | Admitting: Internal Medicine

## 2015-09-06 MED ORDER — METFORMIN HCL 500 MG PO TABS: 500.0000 mg | ORAL_TABLET | Freq: Two times a day (BID) | ORAL | Status: DC

## 2015-09-07 ENCOUNTER — Telehealth: Payer: Self-pay | Admitting: Internal Medicine

## 2015-09-07 ENCOUNTER — Telehealth: Payer: Self-pay | Admitting: Gastroenterology

## 2015-09-07 NOTE — Telephone Encounter (Signed)
Pt called regarding lab work. Dr. Jenny Reichmann left an email but she doesn't use mychart. I let her know what he wrote and she had some questions regarding her blood sugar and if this does make her diabetic.  Can you please give her a call back on her cell at [phone]

## 2015-09-07 NOTE — Telephone Encounter (Signed)
Pt informed

## 2015-09-07 NOTE — Telephone Encounter (Signed)
Patient notified that Dr. Fuller Plan will review her pathology when he returns and we will send a letter

## 2015-09-10 ENCOUNTER — Encounter: Payer: Self-pay | Admitting: Gastroenterology

## 2015-09-10 LAB — HM COLONOSCOPY

## 2015-09-10 NOTE — Addendum Note (Signed)
Addended by: Janith Lima on: 09/10/2015 10:53 AM   Modules accepted: Miquel Dunn

## 2015-09-12 ENCOUNTER — Telehealth: Payer: Self-pay | Admitting: Internal Medicine

## 2015-09-12 NOTE — Telephone Encounter (Signed)
Pt called back and she said the nausea is really bad and is wondering if she can take Zofran that she she already has.  Please respond asap

## 2015-09-12 NOTE — Telephone Encounter (Signed)
Pt informed

## 2015-09-12 NOTE — Telephone Encounter (Signed)
Pt started Metformin last week and she started having diarrhea off and on and now she has been up all night with it. She has no fever and hasn't vomited yet.  She has taken Pepto 4 times already tonight.  She's wondering if it is a side effect from the medication. She isn't going to take her dose today till she hears back from you. Best number for her is [phone]

## 2015-09-12 NOTE — Telephone Encounter (Signed)
Yes, metformin can do this, stop the metformin Yes she can take Zofran for nausea

## 2015-09-16 ENCOUNTER — Other Ambulatory Visit: Payer: Self-pay | Admitting: Neurology

## 2015-09-17 NOTE — Telephone Encounter (Signed)
Last OV: 08/14/15 Next OV: 02/11/16 Will start venlafaxine ER in addition to Cymbalta to address depression and headache, titrate to 75mg  daily

## 2015-10-01 DIAGNOSIS — Z961 Presence of intraocular lens: Secondary | ICD-10-CM | POA: Diagnosis not present

## 2015-10-01 DIAGNOSIS — H26491 Other secondary cataract, right eye: Secondary | ICD-10-CM | POA: Diagnosis not present

## 2015-10-09 ENCOUNTER — Ambulatory Visit: Payer: PPO | Admitting: Neurology

## 2015-10-11 DIAGNOSIS — E042 Nontoxic multinodular goiter: Secondary | ICD-10-CM | POA: Diagnosis not present

## 2015-10-11 DIAGNOSIS — Q992 Fragile X chromosome: Secondary | ICD-10-CM | POA: Diagnosis not present

## 2015-10-11 DIAGNOSIS — R5382 Chronic fatigue, unspecified: Secondary | ICD-10-CM | POA: Diagnosis not present

## 2015-10-11 DIAGNOSIS — Z6838 Body mass index (BMI) 38.0-38.9, adult: Secondary | ICD-10-CM | POA: Diagnosis not present

## 2015-10-11 DIAGNOSIS — R5381 Other malaise: Secondary | ICD-10-CM | POA: Diagnosis not present

## 2015-10-18 ENCOUNTER — Other Ambulatory Visit: Payer: Self-pay | Admitting: Internal Medicine

## 2015-10-20 NOTE — Telephone Encounter (Signed)
faxed

## 2015-10-30 ENCOUNTER — Other Ambulatory Visit: Payer: Self-pay | Admitting: Internal Medicine

## 2015-10-31 DIAGNOSIS — Z6839 Body mass index (BMI) 39.0-39.9, adult: Secondary | ICD-10-CM | POA: Diagnosis not present

## 2015-10-31 DIAGNOSIS — E118 Type 2 diabetes mellitus with unspecified complications: Secondary | ICD-10-CM | POA: Diagnosis not present

## 2015-10-31 DIAGNOSIS — I10 Essential (primary) hypertension: Secondary | ICD-10-CM | POA: Diagnosis not present

## 2015-10-31 DIAGNOSIS — E669 Obesity, unspecified: Secondary | ICD-10-CM | POA: Diagnosis not present

## 2015-11-28 ENCOUNTER — Telehealth: Payer: Self-pay

## 2015-11-28 NOTE — Telephone Encounter (Signed)
-----   Message from [place] sent at 11/28/2015  3:43 PM EDT ----- Marykay Lex,   Patient called asking if Dr.Turner could giver her cardiac clearance- She stated she may need to make an appointment if so please cal her so she can do so.   - [facility] [phone]  Thanks, Maudie Mercury

## 2015-11-28 NOTE — Telephone Encounter (Signed)
Patient st Dr. Radford Pax saw her in the hospital when she went to the hospital for CP August 2016 and a stress test was ordered.  The stress test was negative and no follow-up was planned. The patient is now calling for cardiac clearance for bariatric surgery.  To Dr. Radford Pax.

## 2015-11-29 NOTE — Telephone Encounter (Signed)
Scheduled patient for surgical clearance evaluation 6/12 with Richardson Dopp, PA. Patient was grateful for call.

## 2015-11-29 NOTE — Telephone Encounter (Signed)
Please have her see one of the extenders to make sure she has not had any cardiac issues

## 2015-12-02 NOTE — Progress Notes (Signed)
Cardiology Office Note:    Date:  12/03/2015   ID:  Brandi Ramsey, DOB [birth year], MRN [id number]  PCP:  Scarlette Calico, MD  Cardiologist:  Dr. Fransico Him   Electrophysiologist:  n/a  Referring MD: Janith Lima, MD   Chief Complaint  Patient presents with  . Surgical Clearance    [facility] [phone]    History of Present Illness:     Brandi Ramsey is a 65 y.o. female with a hx of DM, tachycardia, HL. She was evaluated by Dr. Radford Pax in the hospital in 5/16 with chest pain. Outpatient nuclear stress test was low risk and negative for ischemia with normal LV function. She has not followed up in our office since seen in the hospital in 2016. She now presents for surgical clearance. She is being considered for bariatric surgery in [facility].    Here with her husband.  She is seeing Dr. Raul Del in [facility] and is considering gastric sleeve.  She denies chest pain. She does get short of breath with some activities. However, she has been exercising in the pool without chest discomfort or significant dyspnea. Denies syncope. Denies orthopnea.   Past Medical History  Diagnosis Date  . Seizures ([place])   . Diabetes mellitus without complication ([place])   . Fragile X syndrome     ataxia syndrome.    [name] Hypertension   . Hemorrhoids   . Transient cerebral ischemia   . Vertigo, benign paroxysmal   . MS (multiple sclerosis) ([place])   . Cerebellar ataxia ([place])   . Stroke ([place])   . Anxiety   . Arthritis   . Gastric ulcer   . Colon polyps   . Depression   . Fibromyalgia   . Hyperlipidemia   . Obesity   . Pneumonia   . History of nuclear stress test     a. Myoview 5/16: Normal perfusion, EF 78%, low risk  . History of echocardiogram     a. Echo 3/15: Moderate LVH, focal basal hypertrophy, EF 60-65%, normal wall motion  . History of Doppler ultrasound     a. Carotid US 3/15: mild soft plaque origin ICA. 1-39% ICA stenosis.    Past Surgical History  Procedure Laterality  Date  . Abdominal hysterectomy    . Fracture surgery      tib/fib (R) leg    Current Medications: Outpatient Prescriptions Prior to Visit  Medication Sig Dispense Refill  . aspirin EC 81 MG EC tablet Take 1 tablet (81 mg total) by mouth daily.    [name] atorvastatin (LIPITOR) 80 MG tablet Take 1 tablet (80 mg total) by mouth daily at 6 PM. 90 tablet 1  . BREO ELLIPTA 100-25 MCG/INH AEPB Inhale 1 puff into the lungs daily.   11  . Calcium-Vitamin D 600-200 MG-UNIT per tablet Take 1 tablet by mouth 2 (two) times daily.    . clonazePAM (KLONOPIN) 1 MG tablet Take 1 tablet (1 mg total) by mouth 2 (two) times daily as needed for anxiety. 45 tablet 5  . Coenzyme Q10 (CO Q 10 PO) Take by mouth.    . DULoxetine (CYMBALTA) 30 MG capsule Take 1 capsule (30 mg total) by mouth daily. 30 capsule 11  . Melatonin 3 MG TABS Take 3 mg by mouth daily.     . metoCLOPramide (REGLAN) 10 MG tablet Take as directed per prep instructions 2 tablet 0  . Omega-3 Fatty Acids (FISH OIL) 1200 MG CAPS Take 3,000 mg by  mouth daily.     . pantoprazole (PROTONIX) 40 MG tablet TAKE 1 TABLET (40 MG TOTAL) BY MOUTH 2 (TWO) TIMES DAILY. 60 tablet 3  . polyethylene glycol (MIRALAX / GLYCOLAX) packet Take 17 g by mouth daily.    . ranitidine (ZANTAC) 150 MG tablet Take 150 mg by mouth 2 (two) times daily as needed (for indigestion).     . Vitamin D, Ergocalciferol, (DRISDOL) 50000 UNITS CAPS capsule Take 50,000 Units by mouth once a week. Reported on 08/24/2015    . [employer] CALCIUM 600+D 600-800 MG-UNIT TABS Take 1 tablet by mouth 2 (two) times daily with a meal. Reported on 12/03/2015  3  . ferrous sulfate 325 (65 FE) MG tablet Take 325 mg by mouth daily with breakfast. Reported on 12/03/2015    . hydrocortisone (PROCTOZONE-HC) 2.5 % rectal cream Place 1 application rectally 2 (two) times daily. Please add internal applicator (Patient not taking: Reported on 12/03/2015) 30 g 0  . magnesium oxide (MAG-OX) 400 MG tablet Take 400 mg by mouth  daily. Reported on 12/03/2015    . metFORMIN (GLUCOPHAGE) 500 MG tablet Take 1 tablet (500 mg total) by mouth 2 (two) times daily with a meal. (Patient not taking: Reported on 12/03/2015) 180 tablet 1  . metoprolol succinate (TOPROL-XL) 25 MG 24 hr tablet TAKE 1 TABLET AT BEDTIME (Patient not taking: Reported on 12/03/2015) 90 tablet 3  . PHENobarbital (LUMINAL) 97.2 MG tablet TAKE 1 TABLET AT BEDTIME (Patient not taking: Reported on 12/03/2015) 30 tablet 5  . tetrahydrozoline (VISINE) 0.05 % ophthalmic solution Place 2 drops into both eyes 5 (five) times daily as needed (for dry eyes). Reported on 12/03/2015    . venlafaxine XR (EFFEXOR-XR) 75 MG 24 hr capsule Take 1 capsule (75 mg total) by mouth daily. (Patient not taking: Reported on 12/03/2015) 30 capsule 5   No facility-administered medications prior to visit.      Allergies:   Buprenorphine hcl; Meperidine; Morphine and related; Pentazocine; Pentazocine lactate; Phenytoin; Demerol; Dilaudid; Doxycycline hyclate; Doxycycline hyclate; Hydromorphone; Ibuprofen; Phenytoin sodium extended; and Codeine   Social History   Social History  . Marital Status: Married    Spouse Name: N/A  . Number of Children: N/A  . Years of Education: N/A   Social History Main Topics  . Smoking status: Never Smoker   . Smokeless tobacco: Never Used  . Alcohol Use: No  . Drug Use: No  . Sexual Activity:    Partners: Male   Other Topics Concern  . None   Social History Narrative   Retired Health and safety inspector radio and [employer]   [place] with special needs   Married        Family History:  The patient's family history includes Alzheimer's disease in her father; Breast cancer in her maternal grandmother; Cervical cancer in her maternal grandmother; Colon cancer in her maternal aunt; Colon polyps in her brother, father, mother, and sister; Diabetes in her brother, father, paternal grandmother, and sister; Fragile X syndrome in her [place]; Heart attack in her mother;  Heart failure in her maternal grandfather; Raynaud syndrome in her mother.   ROS:   Please see the history of present illness.    Review of Systems  Constitution: Positive for weight gain.  Cardiovascular: Positive for dyspnea on exertion.   All other systems reviewed and are negative.   Physical Exam:    VS:  BP 120/80 mmHg  Pulse 80  Ht 5\' 4"  (1.626 m)  Wt 229 lb  6.4 oz (104.055 kg)  BMI 39.36 kg/m2   Physical Exam  Constitutional: She is oriented to person, place, and time. She appears well-developed and well-nourished.  HENT:  Head: Normocephalic.  Neck: Normal range of motion. No JVD present. Carotid bruit is not present.  Cardiovascular: Normal rate, regular rhythm and normal heart sounds.   No murmur heard. Pulmonary/Chest: Effort normal and breath sounds normal. She has no wheezes. She has no rales.  Abdominal: Soft. There is no tenderness.  Musculoskeletal: Normal range of motion. She exhibits no edema.  Neurological: She is alert and oriented to person, place, and time.  Skin: Skin is warm and dry.  Psychiatric: She has a normal mood and affect.    Wt Readings from Last 3 Encounters:  12/03/15 229 lb 6.4 oz (104.055 kg)  09/05/15 226 lb (102.513 kg)  08/24/15 227 lb (102.967 kg)      Studies/Labs Reviewed:     EKG:  EKG is  ordered today.  The ekg ordered today demonstrates NSR, HR 80, normal axis, nonspecific ST-T wave changes, QTc 442 ms  Recent Labs: 08/22/2015: ALT 21; Hemoglobin 14.0; Platelets 273.0 09/05/2015: BUN 17; Creatinine, Ser 0.86; Potassium 3.9; Sodium 138; TSH 0.94   Recent Lipid Panel    Component Value Date/Time   CHOL 171 09/05/2015 1622   TRIG 132.0 09/05/2015 1622   HDL 44.60 09/05/2015 1622   CHOLHDL 4 09/05/2015 1622   VLDL 26.4 09/05/2015 1622   LDLCALC 100* 09/05/2015 1622    Additional studies/ records that were reviewed today include:   Myoview 5/16 Clinically and electrically negative for ischemia Myoview scan with  normal perfusion.  LVEF calculated at 78%. Overall low risk scan.    Echo 3/15 - Left ventricle: The cavity size was normal. Wall thickness   was increased in a pattern of moderate LVH. There was   focal basal hypertrophy. Systolic function was normal. The   estimated ejection fraction was in the range of 60% to   65%. Wall motion was normal; there were no regional wall   motion abnormalities. There was an increased relative   contribution of atrial contraction to ventricular filling. - Aortic valve: Valve area: 2.3cm^2 (Vmax).  Carotid US 3/15 Summary: Bilateral: mild soft plaque origin ICA. 1-39% ICA stenosis. Vertebral artery flow is antegrade.   ASSESSMENT:     1. Pre-operative cardiovascular examination   2. Hyperlipidemia with target LDL less than 100   3. Type 2 diabetes mellitus with complication, without long-term current use of insulin ([place])   4. Family history of early CAD     PLAN:     In order of problems listed above:  1. Surgical clearance - She is considering gastric sleeve for weight loss with Dr. Raul Del in [place].  The patient does not have any unstable cardiac conditions.  Upon evaluation today, she can achieve 4 METs or greater without anginal symptoms.  According to [facility] and AHA guidelines, she requires no further cardiac workup prior to her noncardiac surgery and should be at acceptable risk.     2. HL - Managed by PCP. Continue statin.   3. DM - Fair control with last A1c 7.2.  FU with PCP for continued management.  Lab Results  Component Value Date   HGBA1C 7.2* 09/05/2015    4. FHx of CAD - With her FHx of CAD and CRFs, I recommend she FU every few years for a stress test. She can FU with Dr. Radford Pax in 1 year.  Medication Adjustments/Labs and Tests Ordered: Current medicines are reviewed at length with the patient today.  Concerns regarding medicines are outlined above.  Medication changes, Labs and Tests ordered today are outlined in the Patient  Instructions noted below. Patient Instructions  Medication Instructions:  Your physician recommends that you continue on your current medications as directed. Please refer to the Current Medication list given to you today. If you need a refill on your cardiac medications before your next appointment, please call your pharmacy. Labwork:  NONE ORDER TODAY Testing/Procedures: NONE ORDER TODAY Follow-Up:  Your physician wants you to follow-up in: [place] will receive a reminder letter in the mail two months in advance. If you don't receive a letter, please call our office to schedule the follow-up appointment. Any Other Special Instructions Will Be Listed Below (If Applicable).         Signed, Richardson Dopp, PA-C  12/03/2015 9:55 AM    [facility] [address], [place], [place]  [address] Phone: [phone]; Fax: [phone]

## 2015-12-03 ENCOUNTER — Ambulatory Visit (INDEPENDENT_AMBULATORY_CARE_PROVIDER_SITE_OTHER): Payer: PPO | Admitting: Physician Assistant

## 2015-12-03 ENCOUNTER — Encounter: Payer: Self-pay | Admitting: Physician Assistant

## 2015-12-03 VITALS — BP 120/80 | HR 80 | Ht 64.0 in | Wt 229.4 lb

## 2015-12-03 DIAGNOSIS — Z01810 Encounter for preprocedural cardiovascular examination: Secondary | ICD-10-CM

## 2015-12-03 DIAGNOSIS — E559 Vitamin D deficiency, unspecified: Secondary | ICD-10-CM | POA: Diagnosis not present

## 2015-12-03 DIAGNOSIS — E785 Hyperlipidemia, unspecified: Secondary | ICD-10-CM

## 2015-12-03 DIAGNOSIS — I10 Essential (primary) hypertension: Secondary | ICD-10-CM

## 2015-12-03 DIAGNOSIS — E118 Type 2 diabetes mellitus with unspecified complications: Secondary | ICD-10-CM | POA: Diagnosis not present

## 2015-12-03 DIAGNOSIS — Z8249 Family history of ischemic heart disease and other diseases of the circulatory system: Secondary | ICD-10-CM | POA: Diagnosis not present

## 2015-12-03 NOTE — Patient Instructions (Addendum)
Medication Instructions:  Your physician recommends that you continue on your current medications as directed. Please refer to the Current Medication list given to you today. If you need a refill on your cardiac medications before your next appointment, please call your pharmacy. Labwork:  NONE ORDER TODAY Testing/Procedures: NONE ORDER TODAY Follow-Up:  Your physician wants you to follow-up in: [place] will receive a reminder letter in the mail two months in advance. If you don't receive a letter, please call our office to schedule the follow-up appointment. Any Other Special Instructions Will Be Listed Below (If Applicable).

## 2015-12-05 DIAGNOSIS — R911 Solitary pulmonary nodule: Secondary | ICD-10-CM | POA: Diagnosis not present

## 2015-12-05 DIAGNOSIS — J449 Chronic obstructive pulmonary disease, unspecified: Secondary | ICD-10-CM | POA: Diagnosis not present

## 2015-12-05 DIAGNOSIS — J45909 Unspecified asthma, uncomplicated: Secondary | ICD-10-CM | POA: Diagnosis not present

## 2015-12-06 DIAGNOSIS — B079 Viral wart, unspecified: Secondary | ICD-10-CM | POA: Diagnosis not present

## 2015-12-17 ENCOUNTER — Ambulatory Visit (INDEPENDENT_AMBULATORY_CARE_PROVIDER_SITE_OTHER): Payer: PPO | Admitting: Licensed Clinical Social Worker

## 2015-12-17 DIAGNOSIS — F4323 Adjustment disorder with mixed anxiety and depressed mood: Secondary | ICD-10-CM | POA: Diagnosis not present

## 2015-12-28 DIAGNOSIS — B079 Viral wart, unspecified: Secondary | ICD-10-CM | POA: Diagnosis not present

## 2016-01-01 ENCOUNTER — Ambulatory Visit: Payer: PPO | Admitting: Licensed Clinical Social Worker

## 2016-01-07 ENCOUNTER — Ambulatory Visit: Payer: PPO | Admitting: Internal Medicine

## 2016-01-10 ENCOUNTER — Other Ambulatory Visit: Payer: Self-pay | Admitting: Internal Medicine

## 2016-01-10 NOTE — Telephone Encounter (Signed)
rx faxed

## 2016-01-11 ENCOUNTER — Other Ambulatory Visit: Payer: Self-pay | Admitting: *Deleted

## 2016-01-11 MED ORDER — DULOXETINE HCL 30 MG PO CPEP: 30.0000 mg | ORAL_CAPSULE | Freq: Every day | ORAL | Status: DC

## 2016-01-15 ENCOUNTER — Ambulatory Visit: Payer: PPO | Admitting: Internal Medicine

## 2016-01-24 ENCOUNTER — Encounter: Payer: Self-pay | Admitting: Internal Medicine

## 2016-01-24 ENCOUNTER — Ambulatory Visit (INDEPENDENT_AMBULATORY_CARE_PROVIDER_SITE_OTHER): Payer: PPO | Admitting: Internal Medicine

## 2016-01-24 VITALS — BP 142/92 | HR 86 | Temp 98.1°F | Resp 16 | Ht 64.0 in | Wt 231.0 lb

## 2016-01-24 DIAGNOSIS — I10 Essential (primary) hypertension: Secondary | ICD-10-CM | POA: Diagnosis not present

## 2016-01-24 DIAGNOSIS — E118 Type 2 diabetes mellitus with unspecified complications: Secondary | ICD-10-CM

## 2016-01-24 LAB — POCT GLUCOSE (DEVICE FOR HOME USE): POC GLUCOSE: 132 mg/dL — AB (ref 70–99)

## 2016-01-24 MED ORDER — EXENATIDE ER 2 MG ~~LOC~~ PEN: 1.0000 | PEN_INJECTOR | SUBCUTANEOUS | 3 refills | Status: DC

## 2016-01-24 MED ORDER — TELMISARTAN 40 MG PO TABS: 40.0000 mg | ORAL_TABLET | Freq: Every day | ORAL | 3 refills | Status: DC

## 2016-01-24 NOTE — Patient Instructions (Signed)

## 2016-01-24 NOTE — Progress Notes (Signed)
Subjective:  Patient ID: Brandi Ramsey, female    DOB: [birth year]  Age: 65 y.o. MRN: [id number]  CC: Follow-up (diabetes follow up ); Hypertension; and Diabetes   HPI Brandi Ramsey presents for evaluation of DM2. She is contemplating having bariatric surgery somewhere in [facility] and may have it done within the next 4 months. She has not had much success with losing weight but also does not have any symptoms of hyperglycemia such as blurred vision, polyuria, polydipsia, or polyphagia.  Outpatient Medications Prior to Visit  Medication Sig Dispense Refill  . aspirin EC 81 MG EC tablet Take 1 tablet (81 mg total) by mouth daily.    [name] atorvastatin (LIPITOR) 80 MG tablet Take 1 tablet (80 mg total) by mouth daily at 6 PM. 90 tablet 1  . BREO ELLIPTA 100-25 MCG/INH AEPB Inhale 1 puff into the lungs daily.   11  . Calcium-Vitamin D 600-200 MG-UNIT per tablet Take 1 tablet by mouth 2 (two) times daily.    . clonazePAM (KLONOPIN) 1 MG tablet TAKE 1 TABLET TWICE A DAY AS NEEDED ANXIETY 45 tablet 1  . Coenzyme Q10 (CO Q 10 PO) Take by mouth.    . DULoxetine (CYMBALTA) 30 MG capsule Take 1 capsule (30 mg total) by mouth daily. 30 capsule 5  . Melatonin 3 MG TABS Take 3 mg by mouth daily.     . metoCLOPramide (REGLAN) 10 MG tablet Take as directed per prep instructions 2 tablet 0  . metoprolol succinate (TOPROL-XL) 25 MG 24 hr tablet Take 25 mg by mouth daily.    . Omega-3 Fatty Acids (FISH OIL) 1200 MG CAPS Take 3,000 mg by mouth daily.     . pantoprazole (PROTONIX) 40 MG tablet TAKE 1 TABLET (40 MG TOTAL) BY MOUTH 2 (TWO) TIMES DAILY. 60 tablet 3  . PHENobarbital (LUMINAL) 97.2 MG tablet Take 97.2 mg by mouth at bedtime.    . polyethylene glycol (MIRALAX / GLYCOLAX) packet Take 17 g by mouth daily.    . ranitidine (ZANTAC) 150 MG tablet Take 150 mg by mouth 2 (two) times daily as needed (for indigestion).     . Vitamin D, Ergocalciferol, (DRISDOL) 50000 UNITS CAPS capsule Take 50,000 Units by mouth  once a week. Reported on 08/24/2015     No facility-administered medications prior to visit.     ROS Review of Systems  Constitutional: Negative for activity change, appetite change, fatigue and unexpected weight change.  HENT: Negative.   Eyes: Negative.  Negative for visual disturbance.  Respiratory: Negative.  Negative for cough, choking, chest tightness, shortness of breath and stridor.   Cardiovascular: Negative.  Negative for chest pain, palpitations and leg swelling.  Gastrointestinal: Negative.  Negative for abdominal pain, constipation, diarrhea, nausea and vomiting.  Endocrine: Negative.  Negative for polydipsia, polyphagia and polyuria.  Genitourinary: Negative.  Negative for difficulty urinating, dysuria and hematuria.  Musculoskeletal: Negative.  Negative for arthralgias, back pain, myalgias and neck pain.  Skin: Negative.  Negative for color change and rash.  Allergic/Immunologic: Negative.   Neurological: Negative.  Negative for dizziness, tremors, weakness, light-headedness, numbness and headaches.  Hematological: Negative.  Negative for adenopathy. Does not bruise/bleed easily.  Psychiatric/Behavioral: Negative.     Objective:  BP (!) 142/92   Pulse 86   Temp 98.1 F (36.7 C) (Oral)   Resp 16   Ht 5\' 4"  (1.626 m)   Wt 231 lb (104.8 kg)   SpO2 96%   BMI 39.65 kg/m  BP Readings from Last 3 Encounters:  01/24/16 (!) 142/92  12/03/15 120/80  09/05/15 120/88    Wt Readings from Last 3 Encounters:  01/24/16 231 lb (104.8 kg)  12/03/15 229 lb 6.4 oz (104.1 kg)  09/05/15 226 lb (102.5 kg)    Physical Exam  Constitutional: She is oriented to person, place, and time. No distress.  HENT:  Mouth/Throat: Oropharynx is clear and moist. No oropharyngeal exudate.  Eyes: Conjunctivae are normal. Right eye exhibits no discharge. Left eye exhibits no discharge. No scleral icterus.  Neck: Normal range of motion. Neck supple. No JVD present. No tracheal deviation  present. No thyromegaly present.  Cardiovascular: Normal rate, regular rhythm, normal heart sounds and intact distal pulses.  Exam reveals no gallop and no friction rub.   No murmur heard. Pulmonary/Chest: Effort normal and breath sounds normal. No stridor. No respiratory distress. She has no wheezes. She has no rales. She exhibits no tenderness.  Abdominal: Soft. Bowel sounds are normal. She exhibits no distension and no mass. There is no tenderness. There is no rebound and no guarding.  Musculoskeletal: Normal range of motion. She exhibits no edema, tenderness or deformity.  Lymphadenopathy:    She has no cervical adenopathy.  Neurological: She is oriented to person, place, and time.  Skin: Skin is warm and dry. No rash noted. She is not diaphoretic. No erythema. No pallor.  Psychiatric: She has a normal mood and affect. Her behavior is normal. Judgment and thought content normal.  Vitals reviewed.   Lab Results  Component Value Date   WBC 10.0 08/22/2015   HGB 14.0 08/22/2015   HCT 42.2 08/22/2015   PLT 273.0 08/22/2015   GLUCOSE 115 (H) 09/05/2015   CHOL 171 09/05/2015   TRIG 132.0 09/05/2015   HDL 44.60 09/05/2015   LDLCALC 100 (H) 09/05/2015   ALT 21 08/22/2015   AST 18 08/22/2015   NA 138 09/05/2015   K 3.9 09/05/2015   CL 103 09/05/2015   CREATININE 0.86 09/05/2015   BUN 17 09/05/2015   CO2 27 09/05/2015   TSH 0.94 09/05/2015   HGBA1C 7.2 (H) 09/05/2015   MICROALBUR 1.4 09/05/2015    Mr Brain W Wo Contrast  Result Date: 03/16/2015 CLINICAL DATA:  65 year old diabetic hypertensive female with fragile X syndrome, multiple sclerosis presenting with post concussion syndrome after falling backwards 7 weeks ago. Dizziness. Posterior head and neck pain. Subsequent encounter. Creatinine was obtained on site at [facility] at [address]Results: Creatinine 1.0 mg/dL. EXAM: MRI HEAD WITHOUT AND WITH CONTRAST MRI CERVICAL SPINE WITHOUT AND WITH CONTRAST TECHNIQUE:  Multiplanar, multiecho pulse sequences of the brain and surrounding structures, and cervical spine, to include the craniocervical junction and cervicothoracic junction, were obtained without and with intravenous contrast. CONTRAST:  69mL MULTIHANCE GADOBENATE DIMEGLUMINE 529 MG/ML IV SOLN COMPARISON:  01/29/2015 CT.  08/27/2013 in 09/2009 2012 brain MR FINDINGS: MRI HEAD FINDINGS Some of the sequences are motion degraded. No acute infarct. No intracranial hemorrhage. Slight progression of moderate nonspecific white matter type changes most notable periventricular region. In a diabetic hypertensive patient of this age, findings may reflect result of small vessel disease. With history multiple sclerosis, demyelinating process is also consideration although appearance is not specific for such. None of these areas demonstrate enhancement or restricted motion as can be seen with areas of active demyelination. None of these areas demonstrate mass effect as can be seen with progressive multifocal leukoencephalopathy. No intracranial mass or abnormal enhancement. Hyperostosis frontalis interna incidentally  noted. Mild global atrophy most notable frontal -temporal lobes without hydrocephalus. Major intracranial vascular structures are patent. Cervical medullary junction, pituitary region, pineal region and orbital structures unremarkable. Post lens replacement. No evidence of mesial temporal sclerosis. MRI CERVICAL SPINE FINDINGS Some of the sequences are motion degraded. Cervical medullary junction unremarkable. Mild motion artifact extends through the cervical cord without definitive focal cord signal abnormality. No abnormal enhancement. Mild kyphosis cervical spine centered at C5 level. Left C4 facet slightly anteriorly located. This appears at most minimally different from the 08/27/2013 examination. If there were any suspicion of instability, flexion/ extension views can be obtained for further delineation. There is no  evidence of bony edema soft tissue edema to suggest acute osseous or soft tissue injury. C2-3:  Negative. C3-4: Shallow disc osteophyte greater right paracentral position. Slight narrowing ventral aspect of the thecal sac without cord compression. C4-5: Shallow disc osteophyte complex slightly greater to right. Mild narrowing ventral aspect of the thecal sac. C5-6: Broad-based disc osteophyte complex greatest extension left paracentral position. Narrowing ventral aspect of the thecal sac greater on left with mild left-sided cord flattening. Left uncinate hypertrophy. Moderate left foraminal narrowing. C6-7: Broad-based disc osteophyte complex slightly greater to left. Narrowing ventral aspect of the thecal sac without cord compression. Mild foraminal narrowing greater on the left. C7-T1: Mild posterior element hypertrophy greater on the left. Slight impression dorsal aspect of the thecal sac greater on the left. IMPRESSION: MRI HEAD FINDINGS Some of the sequences are motion degraded. Slight progression of moderate nonspecific white matter type changes most notable periventricular region. In a diabetic hypertensive patient of this age, findings may reflect result of small vessel disease. With history multiple sclerosis, demyelinating process is also consideration although appearance is not specific for such. No acute infarct or intracranial hemorrhage. No intracranial mass or abnormal enhancement. Mild global atrophy. MRI CERVICAL SPINE Some of the sequences are motion degraded. Mild motion artifact extends through the cervical cord without definitive focal cord signal abnormality. No abnormal enhancement. Mild kyphosis cervical spine centered at C5 level. Left C4 facet slightly anteriorly located. This appears at most minimally different from the 08/27/2013 examination. If there were any suspicion of instability, flexion/ extension views can be obtained for further delineation. There is no evidence of bony edema  soft tissue edema to suggest acute osseous or soft tissue injury. Cervical spondylotic changes most notable C5-6 level. Summary of pertinent findings includes: C3-4 shallow disc osteophyte greater right paracentral position. Slight narrowing ventral aspect of the thecal sac without cord compression. C4-5 shallow disc osteophyte complex slightly greater to right. Mild narrowing ventral aspect of the thecal sac. C5-6 broad-based disc osteophyte complex greatest extension left paracentral position. Narrowing ventral aspect of the thecal sac greater on left with mild left-sided cord flattening. Left uncinate hypertrophy. Moderate left foraminal narrowing. C6-7 broad-based disc osteophyte complex slightly greater to left. Narrowing ventral aspect of the thecal sac without cord compression. Mild foraminal narrowing greater on the left. C7-T1 mild posterior element hypertrophy greater on the left. Slight impression dorsal aspect of the thecal sac greater on the left. Electronically Signed   By: Genia Del M.D.   On: 03/16/2015 00:05   Mr Cervical Spine W Wo Contrast  Result Date: 03/16/2015 CLINICAL DATA:  65 year old diabetic hypertensive female with fragile X syndrome, multiple sclerosis presenting with post concussion syndrome after falling backwards 7 weeks ago. Dizziness. Posterior head and neck pain. Subsequent encounter. Creatinine was obtained on site at [facility] at [address]Results: Creatinine 1.0 mg/dL. EXAM: MRI HEAD WITHOUT AND WITH CONTRAST MRI CERVICAL SPINE WITHOUT AND WITH CONTRAST TECHNIQUE: Multiplanar, multiecho pulse sequences of the brain and surrounding structures, and cervical spine, to include the craniocervical junction and cervicothoracic junction, were obtained without and with intravenous contrast. CONTRAST:  9mL MULTIHANCE GADOBENATE DIMEGLUMINE 529 MG/ML IV SOLN COMPARISON:  01/29/2015 CT.  08/27/2013 in 09/2009 2012 brain MR FINDINGS: MRI HEAD FINDINGS Some of the  sequences are motion degraded. No acute infarct. No intracranial hemorrhage. Slight progression of moderate nonspecific white matter type changes most notable periventricular region. In a diabetic hypertensive patient of this age, findings may reflect result of small vessel disease. With history multiple sclerosis, demyelinating process is also consideration although appearance is not specific for such. None of these areas demonstrate enhancement or restricted motion as can be seen with areas of active demyelination. None of these areas demonstrate mass effect as can be seen with progressive multifocal leukoencephalopathy. No intracranial mass or abnormal enhancement. Hyperostosis frontalis interna incidentally noted. Mild global atrophy most notable frontal -temporal lobes without hydrocephalus. Major intracranial vascular structures are patent. Cervical medullary junction, pituitary region, pineal region and orbital structures unremarkable. Post lens replacement. No evidence of mesial temporal sclerosis. MRI CERVICAL SPINE FINDINGS Some of the sequences are motion degraded. Cervical medullary junction unremarkable. Mild motion artifact extends through the cervical cord without definitive focal cord signal abnormality. No abnormal enhancement. Mild kyphosis cervical spine centered at C5 level. Left C4 facet slightly anteriorly located. This appears at most minimally different from the 08/27/2013 examination. If there were any suspicion of instability, flexion/ extension views can be obtained for further delineation. There is no evidence of bony edema soft tissue edema to suggest acute osseous or soft tissue injury. C2-3:  Negative. C3-4: Shallow disc osteophyte greater right paracentral position. Slight narrowing ventral aspect of the thecal sac without cord compression. C4-5: Shallow disc osteophyte complex slightly greater to right. Mild narrowing ventral aspect of the thecal sac. C5-6: Broad-based disc  osteophyte complex greatest extension left paracentral position. Narrowing ventral aspect of the thecal sac greater on left with mild left-sided cord flattening. Left uncinate hypertrophy. Moderate left foraminal narrowing. C6-7: Broad-based disc osteophyte complex slightly greater to left. Narrowing ventral aspect of the thecal sac without cord compression. Mild foraminal narrowing greater on the left. C7-T1: Mild posterior element hypertrophy greater on the left. Slight impression dorsal aspect of the thecal sac greater on the left. IMPRESSION: MRI HEAD FINDINGS Some of the sequences are motion degraded. Slight progression of moderate nonspecific white matter type changes most notable periventricular region. In a diabetic hypertensive patient of this age, findings may reflect result of small vessel disease. With history multiple sclerosis, demyelinating process is also consideration although appearance is not specific for such. No acute infarct or intracranial hemorrhage. No intracranial mass or abnormal enhancement. Mild global atrophy. MRI CERVICAL SPINE Some of the sequences are motion degraded. Mild motion artifact extends through the cervical cord without definitive focal cord signal abnormality. No abnormal enhancement. Mild kyphosis cervical spine centered at C5 level. Left C4 facet slightly anteriorly located. This appears at most minimally different from the 08/27/2013 examination. If there were any suspicion of instability, flexion/ extension views can be obtained for further delineation. There is no evidence of bony edema soft tissue edema to suggest acute osseous or soft tissue injury. Cervical spondylotic changes most notable C5-6 level. Summary of pertinent findings includes: C3-4 shallow disc osteophyte greater right paracentral position. Slight narrowing ventral aspect  of the thecal sac without cord compression. C4-5 shallow disc osteophyte complex slightly greater to right. Mild narrowing ventral  aspect of the thecal sac. C5-6 broad-based disc osteophyte complex greatest extension left paracentral position. Narrowing ventral aspect of the thecal sac greater on left with mild left-sided cord flattening. Left uncinate hypertrophy. Moderate left foraminal narrowing. C6-7 broad-based disc osteophyte complex slightly greater to left. Narrowing ventral aspect of the thecal sac without cord compression. Mild foraminal narrowing greater on the left. C7-T1 mild posterior element hypertrophy greater on the left. Slight impression dorsal aspect of the thecal sac greater on the left. Electronically Signed   By: Genia Del M.D.   On: 03/16/2015 00:05    Assessment & Plan:   Brandi Ramsey was seen today for follow-up, hypertension and diabetes.  Diagnoses and all orders for this visit:  Essential (primary) hypertension- her blood pressure is not well-controlled, will start an ARB, will monitor her electrolytes and renal function. -     Basic metabolic panel; Future -     telmisartan (MICARDIS) 40 MG tablet; Take 1 tablet (40 mg total) by mouth daily.  Type 2 diabetes mellitus with complication, without long-term current use of insulin ([place])- she reports a history of side effects to metformin so will start a GLP-1 agonist to help control her blood sugars. -     Basic metabolic panel; Future -     Hemoglobin A1c; Future -     telmisartan (MICARDIS) 40 MG tablet; Take 1 tablet (40 mg total) by mouth daily. -     Exenatide ER 2 MG PEN; Inject 1 Act into the skin once a week. -     POCT Glucose (Device for Home Use)  Morbid obesity due to excess calories ([place])- I encouraged her to proceed with bariatric surgery, in the meantime I think starting at GLP-1 agonist will help control her appetite and possibly help her lose weight.   I am having Brandi Ramsey start on telmisartan and Exenatide ER. I am also having her maintain her Vitamin D (Ergocalciferol), Calcium-Vitamin D, ranitidine, BREO ELLIPTA, aspirin, Fish  Oil, Coenzyme Q10 (CO Q 10 PO), Melatonin, polyethylene glycol, metoCLOPramide, pantoprazole, atorvastatin, metoprolol succinate, PHENobarbital, clonazePAM, and DULoxetine.  Meds ordered this encounter  Medications  . telmisartan (MICARDIS) 40 MG tablet    Sig: Take 1 tablet (40 mg total) by mouth daily.    Dispense:  90 tablet    Refill:  3  . Exenatide ER 2 MG PEN    Sig: Inject 1 Act into the skin once a week.    Dispense:  12 each    Refill:  3     Follow-up: Return in about 4 months (around 05/25/2016).  Scarlette Calico, MD

## 2016-01-29 ENCOUNTER — Ambulatory Visit: Payer: PPO | Admitting: Licensed Clinical Social Worker

## 2016-01-30 DIAGNOSIS — E1100 Type 2 diabetes mellitus with hyperosmolarity without nonketotic hyperglycemic-hyperosmolar coma (NKHHC): Secondary | ICD-10-CM | POA: Diagnosis not present

## 2016-01-30 DIAGNOSIS — I10 Essential (primary) hypertension: Secondary | ICD-10-CM | POA: Diagnosis not present

## 2016-01-30 DIAGNOSIS — E669 Obesity, unspecified: Secondary | ICD-10-CM | POA: Diagnosis not present

## 2016-01-30 DIAGNOSIS — E7800 Pure hypercholesterolemia, unspecified: Secondary | ICD-10-CM | POA: Diagnosis not present

## 2016-02-01 DIAGNOSIS — L718 Other rosacea: Secondary | ICD-10-CM | POA: Diagnosis not present

## 2016-02-01 DIAGNOSIS — B079 Viral wart, unspecified: Secondary | ICD-10-CM | POA: Diagnosis not present

## 2016-02-05 ENCOUNTER — Telehealth: Payer: Self-pay | Admitting: Internal Medicine

## 2016-02-05 NOTE — Telephone Encounter (Signed)
Pt called and has some question about her Bp meds.  She would like a nurse to call her   [phone] is the best number

## 2016-02-06 NOTE — Telephone Encounter (Signed)
Patient states that she had her bp taken twice. Pt stated that bp was the first time but not the second time.   Pt will think about whether or not she needs to take.

## 2016-02-09 ENCOUNTER — Other Ambulatory Visit: Payer: Self-pay | Admitting: Internal Medicine

## 2016-02-11 ENCOUNTER — Encounter: Payer: Self-pay | Admitting: Neurology

## 2016-02-11 ENCOUNTER — Ambulatory Visit (INDEPENDENT_AMBULATORY_CARE_PROVIDER_SITE_OTHER): Payer: PPO | Admitting: Neurology

## 2016-02-11 VITALS — BP 140/98 | HR 86 | Ht 64.0 in | Wt 224.0 lb

## 2016-02-11 DIAGNOSIS — G40309 Generalized idiopathic epilepsy and epileptic syndromes, not intractable, without status epilepticus: Secondary | ICD-10-CM

## 2016-02-11 DIAGNOSIS — G250 Essential tremor: Principal | ICD-10-CM

## 2016-02-11 DIAGNOSIS — F329 Major depressive disorder, single episode, unspecified: Secondary | ICD-10-CM

## 2016-02-11 DIAGNOSIS — Q992 Fragile X chromosome: Secondary | ICD-10-CM | POA: Diagnosis not present

## 2016-02-11 DIAGNOSIS — F32A Depression, unspecified: Secondary | ICD-10-CM

## 2016-02-11 DIAGNOSIS — G119 Hereditary ataxia, unspecified: Principal | ICD-10-CM

## 2016-02-11 NOTE — Patient Instructions (Signed)
Follow up in 6 months 

## 2016-02-11 NOTE — Progress Notes (Signed)
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      UPDATE: For headaches, she is taking  Cymbalta.  She is not taking venlafaxine. Postconcussive symptoms have improved.  Once in awhile she has a severe dizzy spell.  She had one this past week.  She associated it with elevated blood pressure (160s/90s).  Overall, blood pressure control hasn't been optimal.  She plans on having gastric sleeve surgery.  She has not had seizures.   HISTORY: Her son has fragile X syndrome with severe autism.  About 20 years ago, she began experiencing dizzy spells.  She apparently had imaging of the head which was suspicious for possible multiple sclerosis.  She had lumbar puncture, which was negative.  About 10 years ago, she developed mild tremor and unsteadiness.  She was worked up by Dr. Hazle Ramsey, a fragile X specialist at [facility], who diagnosed her with fragile X-associated tremor ataxia syndrome.  Work up included genetic testing for FMR1 gene premutation.    She has had a progressive course over the years.  She will have dizzy spells, described as lightheadedness, which will cause falls.  She has fractured her wrists and leg.  She also has tremor of the hands, causing difficulty with writing.  She also developed dysphonia and was found to have tremor involving her "voice box".  She reports prior episodes of both bowel and bladder incontinence, which are rare.  She also reports poor depth perception.  For example, she may sometimes drive too close to the curb or she may accidentally bump into somebody when walking through the grocery store aisle.  She reports increased fatigue and weight gain.  She has depression and mood swings.  She also reports  short-term memory problems.  She also has problems with swallowing and has choked on some occasions, requiring the Heimlich maneuver.   She has a remote history of epilepsy.  She had convulsions as a baby and two generalized tonic-clonic seizures in her early twenties.  She has been on phenobarbital ever since.  No recurrent seizures.  She takes calcium 500mg  and vitamin D 600 units twice daily.  A DEXA scan was performed and was negative.  She continues on phenobarbital 97.5mg  at bedtime.   She also has history of migraine, described as severe pressure and throbbing, over her left eye.  In March 2015, she had such a headache associated with right sided facial numbness and lower facial weakness.  She was admitted to [facility] for TIA.  CT of the head was unremarkable.  MRI of the brain showed periventricular and subcortical hyperintensities.  MRA of the head showed no major intracranial arterial stenosis, however there is question of mild narrowing of the left cavernous ICA.  Carotid duplex showed 1-39% bilateral ICA stenosis.  2D echo showed LVEF of 60-65% with moderate LVH.     She suffered postconcussion syndrome since slipping and hitting the back of her head in August 2016.  She says she passed afterwards for a few seconds.  Afterwards, she noted headache, spinning sensation, nausea and she vomited.  She went to the ED where CT of the head and cervical spine showed no acute findings.  She was discharged home with meclizine and hydrocodone.  Since she has  been home, she still notes constant dull posterior headache.  She takes ibuprofen for it daily but not the hydrocodone.  The dizziness is improving.  She has been more emotional and was crying easily.  She is seeing Dr. Hulan Ramsey of Sports Medicine for management. Due to concerns of possible prior history of MS, she underwent MRI of the brain and cervical spine with and without contrast on 03/15/15.  There was slight progression of nonspecific  cerebral white matter changes, most probably chronic small vessel disease, compared to 2012.  Cervical spine showed no cord lesions.    PAST MEDICAL HISTORY: Past Medical History:  Diagnosis Date  . Anxiety   . Arthritis   . Cerebellar ataxia ([place])   . Colon polyps   . Depression   . Diabetes mellitus without complication ([place])   . Fibromyalgia   . Fragile X syndrome    ataxia syndrome.    . Gastric ulcer   . Hemorrhoids   . History of Doppler ultrasound    a. Carotid US 3/15: mild soft plaque origin ICA. 1-39% ICA stenosis.  [name] History of echocardiogram    a. Echo 3/15: Moderate LVH, focal basal hypertrophy, EF 60-65%, normal wall motion  . History of nuclear stress test    a. Myoview 5/16: Normal perfusion, EF 78%, low risk  . Hyperlipidemia   . Hypertension   . MS (multiple sclerosis) ([place])   . Obesity   . Pneumonia   . Seizures ([place])   . Stroke ([place])   . Transient cerebral ischemia   . Vertigo, benign paroxysmal     MEDICATIONS: Current Outpatient Prescriptions on File Prior to Visit  Medication Sig Dispense Refill  . aspirin EC 81 MG EC tablet Take 1 tablet (81 mg total) by mouth daily.    [name] atorvastatin (LIPITOR) 80 MG tablet Take 1 tablet (80 mg total) by mouth daily at 6 PM. 90 tablet 1  . BREO ELLIPTA 100-25 MCG/INH AEPB Inhale 1 puff into the lungs daily.   11  . Calcium-Vitamin D 600-200 MG-UNIT per tablet Take 1 tablet by mouth 2 (two) times daily.    . clonazePAM (KLONOPIN) 1 MG tablet TAKE 1 TABLET TWICE A DAY AS NEEDED ANXIETY 45 tablet 1  . Coenzyme Q10 (CO Q 10 PO) Take by mouth.    . DULoxetine (CYMBALTA) 30 MG capsule Take 1 capsule (30 mg total) by mouth daily. 30 capsule 5  . Exenatide ER 2 MG PEN Inject 1 Act into the skin once a week. 12 each 3  . Melatonin 3 MG TABS Take 3 mg by mouth daily.     . metoCLOPramide (REGLAN) 10 MG tablet Take as directed per prep instructions 2 tablet 0  . metoprolol succinate (TOPROL-XL) 25 MG 24 hr tablet Take 25 mg  by mouth daily.    . Omega-3 Fatty Acids (FISH OIL) 1200 MG CAPS Take 3,000 mg by mouth daily.     . pantoprazole (PROTONIX) 40 MG tablet TAKE 1 TABLET TWICE A DAY 60 tablet 11  . PHENobarbital (LUMINAL) 97.2 MG tablet Take 97.2 mg by mouth at bedtime.    . polyethylene glycol (MIRALAX / GLYCOLAX) packet Take 17 g by mouth daily.    . ranitidine (ZANTAC) 150 MG tablet Take 150 mg by mouth 2 (two) times daily as needed (for indigestion).     [name] telmisartan (MICARDIS) 40 MG tablet Take 1 tablet (40 mg total) by mouth daily. 90 tablet 3  . Vitamin D,  Ergocalciferol, (DRISDOL) 50000 UNITS CAPS capsule Take 50,000 Units by mouth once a week. Reported on 08/24/2015     No current facility-administered medications on file prior to visit.     ALLERGIES: Allergies  Allergen Reactions  . Buprenorphine Hcl Hives and Itching  . Meperidine Hives and Itching    Other reaction(s): HIVES,ITCHING  . Metformin And Related Diarrhea  . Morphine And Related Hives and Itching  . Pentazocine Anaphylaxis  . Pentazocine Lactate Anaphylaxis  . Phenytoin Hives    Other reaction(s): RASH  . Demerol Hives and Itching  . Dilaudid [Hydromorphone Hcl]     Hallucinations and felt crazy   . Doxycycline Hyclate     Other reaction(s): GI UPSET,NAUSEA,VOMITING  . Doxycycline Hyclate     Other reaction(s): GI UPSET,NAUSEA,VOMITING  . Hydromorphone     Altered mental status  . Ibuprofen     Contraindicated for risk of GI bleeding.  [name] Phenytoin Sodium Extended Hives  . Codeine Rash    When mixed with Demerol Patient reports no reaction with Codeine/APAP (Tylenol #3)    FAMILY HISTORY: Family History  Problem Relation Age of Onset  . Stroke    . Fragile X syndrome Son     Patient is carrier  . Raynaud syndrome Mother   . Colon polyps Mother   . Alzheimer's disease Father   . Diabetes Father   . Colon polyps Father   . Diabetes Sister   . Diabetes Brother   . Cervical cancer Maternal Grandmother   . Breast  cancer Maternal Grandmother   . Heart failure Maternal Grandfather   . Diabetes Paternal Grandmother   . Colon polyps Sister   . Colon polyps Brother   . Colon cancer Maternal Aunt   . Heart attack Mother     SOCIAL HISTORY: Social History   Social History  . Marital status: Married    Spouse name: N/A  . Number of children: N/A  . Years of education: N/A   Occupational History  . Not on file.   Social History Main Topics  . Smoking status: Never Smoker  . Smokeless tobacco: Never Used  . Alcohol use No  . Drug use: No  . Sexual activity: Yes    Partners: Male   Other Topics Concern  . 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   Son with special needs   Married       REVIEW OF SYSTEMS: Constitutional: No fevers, chills, or sweats, no generalized fatigue, change in appetite Eyes: No visual changes, double vision, eye pain Ear, nose and throat: No hearing loss, ear pain, nasal congestion, sore throat Cardiovascular: No chest pain, palpitations Respiratory:  No shortness of breath at rest or with exertion, wheezes GastrointestinaI: No nausea, vomiting, diarrhea, abdominal pain, fecal incontinence Genitourinary:  No dysuria, urinary retention or frequency Musculoskeletal:  No neck pain, back pain Integumentary: No rash, pruritus, skin lesions Neurological: as above Psychiatric: No depression, insomnia, anxiety Endocrine: No palpitations, fatigue, diaphoresis, mood swings, change in appetite, change in weight, increased thirst Hematologic/Lymphatic:  No purpura, petechiae. Allergic/Immunologic: no itchy/runny eyes, nasal congestion, recent allergic reactions, rashes  PHYSICAL EXAM: Vitals:   02/11/16 1129  BP: (!) 140/98  Pulse: 86   General: No acute distress.  Patient appears well-groomed. . Head:  Normocephalic/atraumatic Eyes:  Fundi examined but not visualized Neck: supple, no paraspinal tenderness, full  range of motion Heart:  Regular rate and rhythm Lungs:  Clear to auscultation bilaterally Back: No paraspinal tenderness Neurological Exam: alert and oriented to person, place, and time. Attention span and concentration intact, recent and remote memory intact, fund of knowledge intact.  Speech fluent and not dysarthric, language intact.  Nystagmus noted on tracking in all directions.  Otherwise, CN II-XII intact. Bulk and tone normal, muscle strength 5/5 throughout.  Sensation to light touch intact.  Deep tendon reflexes 2+ throughout.  Finger to nose with mild tremor and dysmetria.  Gait fairly steady. Romberg with sway.  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  29 minutes spent face to face with patient, over 50% spent counseling.  Metta Clines, DO  CC:  Scarlette Calico, MD

## 2016-02-21 ENCOUNTER — Ambulatory Visit (INDEPENDENT_AMBULATORY_CARE_PROVIDER_SITE_OTHER): Payer: PPO | Admitting: Internal Medicine

## 2016-02-21 ENCOUNTER — Encounter: Payer: Self-pay | Admitting: Internal Medicine

## 2016-02-21 ENCOUNTER — Telehealth: Payer: Self-pay | Admitting: Internal Medicine

## 2016-02-21 VITALS — BP 148/100 | HR 87 | Temp 97.8°F | Resp 16 | Ht 64.0 in | Wt 225.5 lb

## 2016-02-21 DIAGNOSIS — Z23 Encounter for immunization: Secondary | ICD-10-CM | POA: Diagnosis not present

## 2016-02-21 DIAGNOSIS — E118 Type 2 diabetes mellitus with unspecified complications: Secondary | ICD-10-CM | POA: Diagnosis not present

## 2016-02-21 DIAGNOSIS — H60392 Other infective otitis externa, left ear: Secondary | ICD-10-CM | POA: Diagnosis not present

## 2016-02-21 DIAGNOSIS — H60399 Other infective otitis externa, unspecified ear: Secondary | ICD-10-CM | POA: Insufficient documentation

## 2016-02-21 LAB — POCT GLUCOSE (DEVICE FOR HOME USE): GLUCOSE FASTING, POC: 114 mg/dL — AB (ref 70–99)

## 2016-02-21 LAB — POCT GLYCOSYLATED HEMOGLOBIN (HGB A1C): Hemoglobin A1C: 6.2

## 2016-02-21 MED ORDER — CIPROFLOXACIN HCL 500 MG PO TABS: 500.0000 mg | ORAL_TABLET | Freq: Two times a day (BID) | ORAL | 1 refills | Status: AC

## 2016-02-21 MED ORDER — CIPROFLOXACIN-HYDROCORTISONE 0.2-1 % OT SUSP: 3.0000 [drp] | Freq: Two times a day (BID) | OTIC | 0 refills | Status: DC

## 2016-02-21 MED ORDER — OXYCODONE HCL 5 MG PO TABS: 5.0000 mg | ORAL_TABLET | ORAL | 0 refills | Status: DC | PRN

## 2016-02-21 NOTE — Progress Notes (Signed)
Subjective:  Patient ID: Brandi Ramsey, female    DOB: 04/20/1951  Age: 65 y.o. MRN: [id number]  CC: Diabetes and Ear Pain   HPI DIEU MINION presents for a one-week history of left-sided intermittent but worsening ear pain. She had amoxicillin at home so she started taking it but says it has not helped. There has been some bloody and purulent drainage from the left ear. She denies sore throat, lymphadenopathy, fever, chills, nausea, or vomiting.   Outpatient Medications Prior to Visit  Medication Sig Dispense Refill  . aspirin EC 81 MG EC tablet Take 1 tablet (81 mg total) by mouth daily.    [name] atorvastatin (LIPITOR) 80 MG tablet Take 1 tablet (80 mg total) by mouth daily at 6 PM. 90 tablet 1  . BREO ELLIPTA 100-25 MCG/INH AEPB Inhale 1 puff into the lungs daily.   11  . Calcium-Vitamin D 600-200 MG-UNIT per tablet Take 1 tablet by mouth 2 (two) times daily.    . clonazePAM (KLONOPIN) 1 MG tablet TAKE 1 TABLET TWICE A DAY AS NEEDED ANXIETY 45 tablet 1  . Coenzyme Q10 (CO Q 10 PO) Take by mouth.    . DULoxetine (CYMBALTA) 30 MG capsule Take 1 capsule (30 mg total) by mouth daily. 30 capsule 5  . Exenatide ER 2 MG PEN Inject 1 Act into the skin once a week. 12 each 3  . Melatonin 3 MG TABS Take 3 mg by mouth daily.     . metoCLOPramide (REGLAN) 10 MG tablet Take as directed per prep instructions 2 tablet 0  . metoprolol succinate (TOPROL-XL) 25 MG 24 hr tablet Take 25 mg by mouth daily.    . Omega-3 Fatty Acids (FISH OIL) 1200 MG CAPS Take 3,000 mg by mouth daily.     . pantoprazole (PROTONIX) 40 MG tablet TAKE 1 TABLET TWICE A DAY 60 tablet 11  . PHENobarbital (LUMINAL) 97.2 MG tablet Take 97.2 mg by mouth at bedtime.    . polyethylene glycol (MIRALAX / GLYCOLAX) packet Take 17 g by mouth daily.    . ranitidine (ZANTAC) 150 MG tablet Take 150 mg by mouth 2 (two) times daily as needed (for indigestion).     [name] telmisartan (MICARDIS) 40 MG tablet Take 1 tablet (40 mg total) by mouth daily.  90 tablet 3  . Vitamin D, Ergocalciferol, (DRISDOL) 50000 UNITS CAPS capsule Take 50,000 Units by mouth once a week. Reported on 08/24/2015     No facility-administered medications prior to visit.     ROS Review of Systems  Constitutional: Negative.  Negative for activity change, appetite change, chills, fatigue and unexpected weight change.  HENT: Positive for ear discharge and ear pain. Negative for congestion, facial swelling, hearing loss, sinus pressure, sore throat, trouble swallowing and voice change.   Eyes: Negative.  Negative for visual disturbance.  Respiratory: Negative.  Negative for cough, choking, chest tightness, shortness of breath and stridor.   Cardiovascular: Negative.  Negative for chest pain, palpitations and leg swelling.  Gastrointestinal: Negative.  Negative for abdominal pain, diarrhea, nausea and vomiting.  Endocrine: Negative for polydipsia, polyphagia and polyuria.  Genitourinary: Negative.  Negative for dysuria, hematuria and urgency.  Musculoskeletal: Negative.  Negative for arthralgias, back pain, joint swelling and myalgias.  Allergic/Immunologic: Negative.   Neurological: Negative.  Negative for dizziness, weakness, light-headedness and headaches.  Hematological: Negative.  Negative for adenopathy. Does not bruise/bleed easily.  Psychiatric/Behavioral: Negative.     Objective:  BP (!) 148/100 (BP Location:  Left Arm, Patient Position: Sitting, Cuff Size: Normal)   Pulse 87   Temp 97.8 F (36.6 C) (Oral)   Resp 16   Ht 5\' 4"  (1.626 m)   Wt 225 lb 8 oz (102.3 kg)   SpO2 97%   BMI 38.71 kg/m   BP Readings from Last 3 Encounters:  02/21/16 (!) 148/100  02/11/16 (!) 140/98  01/24/16 (!) 142/92    Wt Readings from Last 3 Encounters:  02/21/16 225 lb 8 oz (102.3 kg)  02/11/16 224 lb (101.6 kg)  01/24/16 231 lb (104.8 kg)    Physical Exam  Constitutional: She is oriented to person, place, and time. No distress.  HENT:  Right Ear: Hearing,  tympanic membrane, external ear and ear canal normal.  Left Ear: Hearing normal. There is tenderness. No drainage. No foreign bodies. No mastoid tenderness.  Mouth/Throat: Oropharynx is clear and moist. No oropharyngeal exudate.  Left EAC is swollen but there is no appreciable exudate. There is mild erythema.  Eyes: Conjunctivae are normal. Right eye exhibits no discharge. Left eye exhibits no discharge. No scleral icterus.  Neck: Normal range of motion. Neck supple. No JVD present. No tracheal deviation present. No thyromegaly present.  Cardiovascular: Normal rate, regular rhythm, normal heart sounds and intact distal pulses.  Exam reveals no gallop and no friction rub.   No murmur heard. Pulmonary/Chest: Effort normal and breath sounds normal. No stridor. No respiratory distress. She has no wheezes. She has no rales. She exhibits no tenderness.  Abdominal: Soft. Bowel sounds are normal. She exhibits no distension and no mass. There is no tenderness. There is no rebound and no guarding.  Musculoskeletal: Normal range of motion. She exhibits deformity. She exhibits no edema or tenderness.  Lymphadenopathy:    She has no cervical adenopathy.  Neurological: She is oriented to person, place, and time.  Skin: Skin is warm and dry. No rash noted. She is not diaphoretic. No erythema. No pallor.    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 Dizziness. Posterior head and neck pain. Subsequent encounter. Creatinine was obtained on site at [place] [facility] at [address]Results: Creatinine 1.0 mg/dL. EXAM: MRI HEAD WITHOUT AND WITH CONTRAST MRI CERVICAL SPINE WITHOUT AND WITH CONTRAST TECHNIQUE: Multiplanar, multiecho pulse sequences of the brain and surrounding structures, and cervical spine, to include the craniocervical junction and cervicothoracic junction, were obtained without and with intravenous contrast. CONTRAST:  66mL MULTIHANCE GADOBENATE DIMEGLUMINE 529 MG/ML IV SOLN COMPARISON:  01/29/2015 CT.  08/27/2013 in 09/2009 2012 brain MR FINDINGS: MRI HEAD FINDINGS Some of the sequences are motion degraded. No acute infarct. No intracranial hemorrhage. Slight progression of moderate nonspecific white matter type changes most notable  periventricular region. In a diabetic hypertensive patient of this age, findings may reflect result of small vessel disease. With history multiple sclerosis, demyelinating process is also consideration although appearance is not specific for such. None of these areas demonstrate enhancement or restricted motion as can be seen with areas of active demyelination. None of these areas demonstrate mass effect as can be seen with progressive multifocal leukoencephalopathy. No intracranial mass or abnormal enhancement. Hyperostosis frontalis interna incidentally noted. Mild global atrophy most notable frontal -temporal lobes without hydrocephalus. Major intracranial vascular structures are patent. Cervical medullary junction, pituitary region, pineal region and orbital structures unremarkable. Post lens replacement. No evidence of mesial temporal sclerosis. MRI CERVICAL SPINE FINDINGS Some of the sequences are motion degraded. Cervical medullary junction unremarkable. Mild motion artifact  extends through the cervical cord without definitive focal cord signal abnormality. No abnormal enhancement. Mild kyphosis cervical spine centered at C5 level. Left C4 facet slightly anteriorly located. This appears at most minimally different from the 08/27/2013 examination. If there were any suspicion of instability, flexion/ extension views can be obtained for further delineation. There is no evidence of bony edema soft tissue edema to suggest acute osseous or soft tissue injury. C2-3:  Negative. C3-4: Shallow disc osteophyte greater right paracentral position. Slight narrowing ventral aspect of the thecal sac without cord compression. C4-5: Shallow disc osteophyte complex slightly greater to right. Mild narrowing ventral aspect of the thecal sac. C5-6: Broad-based disc osteophyte complex greatest extension left paracentral position. Narrowing ventral aspect of the thecal sac greater on left with mild left-sided cord flattening. Left uncinate hypertrophy. Moderate left foraminal narrowing. C6-7: Broad-based disc osteophyte complex slightly greater to left. Narrowing ventral aspect of the thecal sac without cord compression. Mild foraminal narrowing greater on the left. C7-T1: Mild posterior element hypertrophy greater on the left. Slight impression dorsal aspect of the thecal sac greater on the left. IMPRESSION: MRI HEAD FINDINGS Some of the sequences are motion degraded. Slight progression of moderate nonspecific white matter type changes most notable periventricular region. In a diabetic hypertensive patient of this age, findings may reflect result of small vessel disease. With history multiple sclerosis, demyelinating process is also consideration although appearance is not specific for such. No acute infarct or intracranial hemorrhage. No intracranial mass or abnormal enhancement. Mild global atrophy. MRI CERVICAL SPINE Some of the sequences are motion degraded. Mild motion artifact extends through the  cervical cord without definitive focal cord signal abnormality. No abnormal enhancement. Mild kyphosis cervical spine centered at C5 level. Left C4 facet slightly anteriorly located. This appears at most minimally different from the 08/27/2013 examination. If there were any suspicion of instability, flexion/ extension views can be obtained for further delineation. There is no evidence of bony edema soft tissue edema to suggest acute osseous or soft tissue injury. Cervical spondylotic changes most notable C5-6 level. Summary of pertinent findings includes: C3-4 shallow disc osteophyte greater right paracentral position. Slight narrowing ventral aspect of the thecal sac without cord compression. C4-5 shallow disc osteophyte complex slightly greater to right. Mild narrowing ventral aspect of the thecal sac. C5-6 broad-based disc osteophyte complex greatest extension left paracentral position. Narrowing ventral aspect of the thecal sac greater on left with mild left-sided cord flattening. Left uncinate hypertrophy. Moderate left foraminal narrowing. C6-7 broad-based disc osteophyte complex slightly greater to left. Narrowing ventral aspect of the thecal sac without cord compression. Mild foraminal narrowing greater on the left. C7-T1 mild posterior element hypertrophy greater on the left. Slight impression dorsal aspect of  the thecal sac greater on the left. Electronically Signed   By: Genia Del M.D.   On: 03/16/2015 00:05   Mr Cervical Spine W Wo Contrast  Result Date: 03/16/2015 CLINICAL DATA:  65 year old diabetic hypertensive female with fragile X syndrome, multiple sclerosis presenting with post concussion syndrome after falling backwards 7 weeks ago. Dizziness. Posterior head and neck pain. Subsequent encounter. Creatinine was obtained on site at [place] at [address]Results: Creatinine 1.0 mg/dL. EXAM: MRI HEAD WITHOUT AND WITH CONTRAST MRI CERVICAL SPINE WITHOUT AND WITH CONTRAST  TECHNIQUE: Multiplanar, multiecho pulse sequences of the brain and surrounding structures, and cervical spine, to include the craniocervical junction and cervicothoracic junction, were obtained without and with intravenous contrast. CONTRAST:  51mL MULTIHANCE GADOBENATE DIMEGLUMINE 529 MG/ML IV SOLN COMPARISON:  01/29/2015 CT.  08/27/2013 in 09/2009 2012 brain MR FINDINGS: MRI HEAD FINDINGS Some of the sequences are motion degraded. No acute infarct. No intracranial hemorrhage. Slight progression of moderate nonspecific white matter type changes most notable periventricular region. In a diabetic hypertensive patient of this age, findings may reflect result of small vessel disease. With history multiple sclerosis, demyelinating process is also consideration although appearance is not specific for such. None of these areas demonstrate enhancement or restricted motion as can be seen with areas of active demyelination. None of these areas demonstrate mass effect as can be seen with progressive multifocal leukoencephalopathy. No intracranial mass or abnormal enhancement. Hyperostosis frontalis interna incidentally noted. Mild global atrophy most notable frontal -temporal lobes without hydrocephalus. Major intracranial vascular structures are patent. Cervical medullary junction, pituitary region, pineal region and orbital structures unremarkable. Post lens replacement. No evidence of mesial temporal sclerosis. MRI CERVICAL SPINE FINDINGS Some of the sequences are motion degraded. Cervical medullary junction unremarkable. Mild motion artifact extends through the cervical cord without definitive focal cord signal abnormality. No abnormal enhancement. Mild kyphosis cervical spine centered at C5 level. Left C4 facet slightly anteriorly located. This appears at most minimally different from the 08/27/2013 examination. If there were any suspicion of instability, flexion/ extension views can be obtained for further delineation.  There is no evidence of bony edema soft tissue edema to suggest acute osseous or soft tissue injury. C2-3:  Negative. C3-4: Shallow disc osteophyte greater right paracentral position. Slight narrowing ventral aspect of the thecal sac without cord compression. C4-5: Shallow disc osteophyte complex slightly greater to right. Mild narrowing ventral aspect of the thecal sac. C5-6: Broad-based disc osteophyte complex greatest extension left paracentral position. Narrowing ventral aspect of the thecal sac greater on left with mild left-sided cord flattening. Left uncinate hypertrophy. Moderate left foraminal narrowing. C6-7: Broad-based disc osteophyte complex slightly greater to left. Narrowing ventral aspect of the thecal sac without cord compression. Mild foraminal narrowing greater on the left. C7-T1: Mild posterior element hypertrophy greater on the left. Slight impression dorsal aspect of the thecal sac greater on the left. IMPRESSION: MRI HEAD FINDINGS Some of the sequences are motion degraded. Slight progression of moderate nonspecific white matter type changes most notable periventricular region. In a diabetic hypertensive patient of this age, findings may reflect result of small vessel disease. With history multiple sclerosis, demyelinating process is also consideration although appearance is not specific for such. No acute infarct or intracranial hemorrhage. No intracranial mass or abnormal enhancement. Mild global atrophy. MRI CERVICAL SPINE Some of the sequences are motion degraded. Mild motion artifact extends through the cervical cord without definitive focal cord signal abnormality. No abnormal enhancement. Mild kyphosis cervical spine centered  at C5 level. Left C4 facet slightly anteriorly located. This appears at most minimally different from the 08/27/2013 examination. If there were any suspicion of instability, flexion/ extension views can be obtained for further delineation. There is no evidence of  bony edema soft tissue edema to suggest acute osseous or soft tissue injury. Cervical spondylotic changes most notable C5-6 level. Summary of pertinent findings includes: C3-4 shallow disc osteophyte greater right paracentral position. Slight narrowing ventral aspect of the thecal sac without cord compression. C4-5 shallow disc osteophyte complex slightly greater to right. Mild narrowing ventral aspect of the thecal sac. C5-6 broad-based disc osteophyte complex greatest extension left paracentral position. Narrowing ventral aspect of the thecal sac greater on left with mild left-sided cord flattening. Left uncinate hypertrophy. Moderate left foraminal narrowing. C6-7 broad-based disc osteophyte complex slightly greater to left. Narrowing ventral aspect of the thecal sac without cord compression. Mild foraminal narrowing greater on the left. C7-T1 mild posterior element hypertrophy greater on the left. Slight impression dorsal aspect of the thecal sac greater on the left. Electronically Signed   By: Genia Del M.D.   On: 03/16/2015 00:05    Assessment & Plan:   Aleigha was seen today for diabetes and ear pain.  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 Therefore, I will treat her with systemic and topical Cipro, will also use topical hydrocortisone to reduce the pain and inflammation. She will control the pain with oxycodone as needed. -     ciprofloxacin (CIPRO) 500 MG tablet; Take 1 tablet (500 mg total) by mouth 2 (two) times daily. -     ciprofloxacin-hydrocortisone (CIPRO HC OTIC) otic suspension; Place 3 drops into the left ear 2 (two) times daily. -     oxyCODONE (OXY IR/ROXICODONE) 5 MG immediate release tablet; Take 1 tablet (5 mg total) by mouth every 4 (four) hours as needed for  severe pain.  Type 2 diabetes mellitus with complication, unspecified long term insulin use status ([place])- her A1c is 6.2%, her blood sugars are well-controlled. -     POCT Glucose (Device for Home Use) -     Cancel: POCT Urinalysis Dipstick (Automated) -     POCT glycosylated hemoglobin (Hb A1C)  Need for prophylactic vaccination and inoculation against influenza -     Flu Vaccine QUAD 36+ mos IM   I am having Ms. Mule start on ciprofloxacin, ciprofloxacin-hydrocortisone, and oxyCODONE. I am also having her maintain her Vitamin D (Ergocalciferol), Calcium-Vitamin D, ranitidine, BREO ELLIPTA, aspirin, Fish Oil, Coenzyme Q10 (CO Q 10 PO), Melatonin, polyethylene glycol, metoCLOPramide, atorvastatin, metoprolol succinate, PHENobarbital, clonazePAM, DULoxetine, telmisartan, Exenatide ER, and pantoprazole.  Meds ordered this encounter  Medications  . ciprofloxacin (CIPRO) 500 MG tablet    Sig: Take 1 tablet (500 mg total) by mouth 2 (two) times daily.    Dispense:  20 tablet    Refill:  1  . ciprofloxacin-hydrocortisone (CIPRO HC OTIC) otic suspension    Sig: Place 3 drops into the left ear 2 (two) times daily.    Dispense:  10 mL    Refill:  0  . oxyCODONE (OXY IR/ROXICODONE) 5 MG immediate release tablet    Sig: Take 1 tablet (5 mg total) by mouth every 4 (four) hours as needed for severe pain.  Dispense:  30 tablet    Refill:  0     Follow-up: Return in about 2 weeks (around 03/06/2016).  Scarlette Calico, MD

## 2016-02-21 NOTE — Patient Instructions (Signed)

## 2016-02-21 NOTE — Telephone Encounter (Signed)
Called pharmacy and changed to cipro eye drops.

## 2016-02-21 NOTE — Telephone Encounter (Signed)
Pharmacy called, said that the meds that was just called in today is to expensive for pt. It is over $200.00 can we call something else in asap?

## 2016-02-21 NOTE — Progress Notes (Signed)
Pre visit review using our clinic review tool, if applicable. No additional management support is needed unless otherwise documented below in the visit note. 

## 2016-03-03 DIAGNOSIS — I10 Essential (primary) hypertension: Secondary | ICD-10-CM | POA: Diagnosis not present

## 2016-03-03 DIAGNOSIS — E669 Obesity, unspecified: Secondary | ICD-10-CM | POA: Diagnosis not present

## 2016-03-03 DIAGNOSIS — Z01818 Encounter for other preprocedural examination: Secondary | ICD-10-CM | POA: Diagnosis not present

## 2016-03-03 DIAGNOSIS — E1100 Type 2 diabetes mellitus with hyperosmolarity without nonketotic hyperglycemic-hyperosmolar coma (NKHHC): Secondary | ICD-10-CM | POA: Diagnosis not present

## 2016-03-04 DIAGNOSIS — M25571 Pain in right ankle and joints of right foot: Secondary | ICD-10-CM | POA: Diagnosis not present

## 2016-03-04 DIAGNOSIS — M722 Plantar fascial fibromatosis: Secondary | ICD-10-CM | POA: Diagnosis not present

## 2016-03-04 DIAGNOSIS — Z6838 Body mass index (BMI) 38.0-38.9, adult: Secondary | ICD-10-CM | POA: Diagnosis not present

## 2016-03-14 ENCOUNTER — Telehealth: Payer: Self-pay | Admitting: Neurology

## 2016-03-16 NOTE — Progress Notes (Deleted)
[facility] [place],  [address] Phone: [phone] Subjective:    I'm seeing this patient by the request  of:    CC: Foot pain  [id number]  Brandi Ramsey is a 65 y.o. female coming in with complaint of foot pain.     Past Medical History:  Diagnosis Date  . Anxiety   . Arthritis   . Cerebellar ataxia ([place])   . Colon polyps   . Depression   . Diabetes mellitus without complication ([place])   . Fibromyalgia   . Fragile X syndrome    ataxia syndrome.    . Gastric ulcer   . Hemorrhoids   . History of Doppler ultrasound    a. Carotid US 3/15: mild soft plaque origin ICA. 1-39% ICA stenosis.  [name] History of echocardiogram    a. Echo 3/15: Moderate LVH, focal basal hypertrophy, EF 60-65%, normal wall motion  . History of nuclear stress test    a. Myoview 5/16: Normal perfusion, EF 78%, low risk  . Hyperlipidemia   . Hypertension   . MS (multiple sclerosis) ([place])   . Obesity   . Pneumonia   . Seizures ([place])   . Stroke ([place])   . Transient cerebral ischemia   . Vertigo, benign paroxysmal    Past Surgical History:  Procedure Laterality Date  . ABDOMINAL HYSTERECTOMY    . FRACTURE SURGERY     tib/fib (R) leg   Social History   Social History  . Marital status: Married    Spouse name: N/A  . Number of children: N/A  . Years of education: N/A   Social History Main Topics  . Smoking status: Never Smoker  . Smokeless tobacco: Never Used  . Alcohol use No  . Drug use: No  . Sexual activity: Yes    Partners: Male   Other Topics Concern  . 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 Buprenorphine Hcl Hives and Itching  . Meperidine Hives and Itching    Other reaction(s): HIVES,ITCHING  . Metformin And Related Diarrhea  . Morphine And Related Hives and Itching  . Pentazocine Anaphylaxis  .  Pentazocine Lactate Anaphylaxis  . Phenytoin Hives    Other reaction(s): RASH  . Demerol Hives and Itching  . Dilaudid [Hydromorphone Hcl]     Hallucinations and felt crazy   . Doxycycline Hyclate     Other reaction(s): GI UPSET,NAUSEA,VOMITING  . Doxycycline Hyclate     Other reaction(s): GI UPSET,NAUSEA,VOMITING  . Hydromorphone     Altered mental status  . Ibuprofen     Contraindicated for risk of GI bleeding.  [name] Phenytoin Sodium Extended Hives  . Codeine Rash    When mixed with Demerol Patient reports no reaction with Codeine/APAP (Tylenol #3)   Family History  Problem Relation Age of Onset  . Stroke    . Fragile X syndrome Son     Patient is carrier  . Raynaud syndrome Mother   . Colon polyps Mother   . Alzheimer's disease Father   . Diabetes Father   . Colon polyps Father   . Diabetes Sister   . Diabetes Brother   . Cervical cancer Maternal Grandmother   . Breast cancer Maternal Grandmother   . Heart failure Maternal Grandfather   . Diabetes Paternal Grandmother   .  Colon polyps Sister   . Colon polyps Brother   . Colon cancer Maternal Aunt   . Heart attack Mother     Past medical history, social, surgical and family history all reviewed in electronic medical record.  No pertanent information unless stated regarding to the chief complaint.   Review of Systems: No headache, visual changes, nausea, vomiting, diarrhea, constipation, dizziness, abdominal pain, skin rash, fevers, chills, night sweats, weight loss, swollen lymph nodes, body aches, joint swelling, muscle aches, chest pain, shortness of breath, mood changes.   Objective  There were no vitals taken for this visit.  General: No apparent distress alert and oriented x3 mood and affect normal, dressed appropriately.  HEENT: Pupils equal, extraocular movements intact  Respiratory: Patient's speak in full sentences and does not appear short of breath  Cardiovascular: No lower extremity edema, non tender, no  erythema  Skin: Warm dry intact with no signs of infection or rash on extremities or on axial skeleton.  Abdomen: Soft nontender  Neuro: Cranial nerves II through XII are intact, neurovascularly intact in all extremities with 2+ DTRs and 2+ pulses.  Lymph: No lymphadenopathy of posterior or anterior cervical chain or axillae bilaterally.  Gait normal with good balance and coordination.  MSK:  Non tender with full range of motion and good stability and symmetric strength and tone of shoulders, elbows, wrist, hip, knee and ankles bilaterally.     Impression and Recommendations:     This case required medical decision making of moderate complexity.      Note: This dictation was prepared with Dragon dictation along with smaller phrase technology. Any transcriptional errors that result from this process are unintentional.

## 2016-03-17 ENCOUNTER — Ambulatory Visit: Payer: PPO | Admitting: Family Medicine

## 2016-03-18 NOTE — Telephone Encounter (Signed)
closed

## 2016-03-27 ENCOUNTER — Telehealth: Payer: Self-pay | Admitting: Internal Medicine

## 2016-03-27 ENCOUNTER — Other Ambulatory Visit: Payer: Self-pay | Admitting: Internal Medicine

## 2016-03-27 DIAGNOSIS — H60392 Other infective otitis externa, left ear: Secondary | ICD-10-CM

## 2016-03-27 MED ORDER — CIPROFLOXACIN-HYDROCORTISONE 0.2-1 % OT SUSP: 3.0000 [drp] | Freq: Two times a day (BID) | OTIC | 0 refills | Status: DC

## 2016-03-27 NOTE — Telephone Encounter (Signed)
done

## 2016-03-27 NOTE — Telephone Encounter (Signed)
error 

## 2016-03-27 NOTE — Telephone Encounter (Signed)
Please advise 

## 2016-03-27 NOTE — Telephone Encounter (Signed)
ciprofloxacin-hydrocortisone (CIPRO HC OTIC) otic suspension  Patient states she has an ear infection again and would like this refilled. She states it really helped last time. Please follow up with patient. Thank you.

## 2016-03-27 NOTE — Telephone Encounter (Signed)
Pt informed

## 2016-04-02 DIAGNOSIS — Z6839 Body mass index (BMI) 39.0-39.9, adult: Secondary | ICD-10-CM | POA: Diagnosis not present

## 2016-04-02 DIAGNOSIS — F419 Anxiety disorder, unspecified: Secondary | ICD-10-CM | POA: Diagnosis not present

## 2016-04-02 DIAGNOSIS — I10 Essential (primary) hypertension: Secondary | ICD-10-CM | POA: Diagnosis not present

## 2016-04-02 DIAGNOSIS — E785 Hyperlipidemia, unspecified: Secondary | ICD-10-CM | POA: Diagnosis not present

## 2016-04-02 DIAGNOSIS — Z79899 Other long term (current) drug therapy: Secondary | ICD-10-CM | POA: Diagnosis not present

## 2016-04-02 DIAGNOSIS — E119 Type 2 diabetes mellitus without complications: Secondary | ICD-10-CM | POA: Diagnosis not present

## 2016-04-02 DIAGNOSIS — K219 Gastro-esophageal reflux disease without esophagitis: Secondary | ICD-10-CM | POA: Diagnosis not present

## 2016-04-02 DIAGNOSIS — Z01818 Encounter for other preprocedural examination: Secondary | ICD-10-CM | POA: Diagnosis not present

## 2016-04-02 DIAGNOSIS — Z9013 Acquired absence of bilateral breasts and nipples: Secondary | ICD-10-CM | POA: Diagnosis not present

## 2016-04-02 DIAGNOSIS — Z90710 Acquired absence of both cervix and uterus: Secondary | ICD-10-CM | POA: Diagnosis not present

## 2016-04-02 DIAGNOSIS — Z7952 Long term (current) use of systemic steroids: Secondary | ICD-10-CM | POA: Diagnosis not present

## 2016-04-02 DIAGNOSIS — K449 Diaphragmatic hernia without obstruction or gangrene: Secondary | ICD-10-CM | POA: Diagnosis not present

## 2016-04-04 ENCOUNTER — Other Ambulatory Visit: Payer: Self-pay | Admitting: Internal Medicine

## 2016-04-05 ENCOUNTER — Other Ambulatory Visit: Payer: Self-pay | Admitting: Internal Medicine

## 2016-04-07 NOTE — Telephone Encounter (Signed)
rx faxed to [facility] 

## 2016-04-11 DIAGNOSIS — E1100 Type 2 diabetes mellitus with hyperosmolarity without nonketotic hyperglycemic-hyperosmolar coma (NKHHC): Secondary | ICD-10-CM | POA: Diagnosis not present

## 2016-04-11 DIAGNOSIS — I10 Essential (primary) hypertension: Secondary | ICD-10-CM | POA: Diagnosis not present

## 2016-04-11 DIAGNOSIS — E669 Obesity, unspecified: Secondary | ICD-10-CM | POA: Diagnosis not present

## 2016-04-18 ENCOUNTER — Other Ambulatory Visit: Payer: Self-pay | Admitting: Internal Medicine

## 2016-04-22 NOTE — Telephone Encounter (Signed)
rx faxed

## 2016-04-28 ENCOUNTER — Telehealth: Payer: Self-pay | Admitting: Internal Medicine

## 2016-04-28 NOTE — Telephone Encounter (Signed)
We have it

## 2016-04-28 NOTE — Telephone Encounter (Signed)
Patient wants to pick up this afternoon if possible.

## 2016-04-28 NOTE — Telephone Encounter (Signed)
Patient will come pick up. 

## 2016-04-28 NOTE — Telephone Encounter (Signed)
Patient states she is in the donut hole for the rest of the year.  She would like to know if Dr. Ronnald Ramp could give her samples of bydureon.

## 2016-04-29 NOTE — Telephone Encounter (Signed)
Pt came in and picked up two boxes.

## 2016-05-12 ENCOUNTER — Telehealth: Payer: Self-pay | Admitting: Emergency Medicine

## 2016-05-12 DIAGNOSIS — E669 Obesity, unspecified: Secondary | ICD-10-CM | POA: Diagnosis not present

## 2016-05-12 DIAGNOSIS — E7800 Pure hypercholesterolemia, unspecified: Secondary | ICD-10-CM | POA: Diagnosis not present

## 2016-05-12 DIAGNOSIS — Z6837 Body mass index (BMI) 37.0-37.9, adult: Secondary | ICD-10-CM | POA: Diagnosis not present

## 2016-05-12 DIAGNOSIS — E1100 Type 2 diabetes mellitus with hyperosmolarity without nonketotic hyperglycemic-hyperosmolar coma (NKHHC): Secondary | ICD-10-CM | POA: Diagnosis not present

## 2016-05-12 DIAGNOSIS — I10 Essential (primary) hypertension: Secondary | ICD-10-CM | POA: Diagnosis not present

## 2016-05-12 NOTE — Telephone Encounter (Signed)
Pt is requesting samples of Bydureon, but we do not have any in the fridge.

## 2016-05-13 NOTE — Telephone Encounter (Signed)
Left detailed phone message Bydureon is in fridge on side A ready for pick up by pt.

## 2016-05-27 DIAGNOSIS — E119 Type 2 diabetes mellitus without complications: Secondary | ICD-10-CM | POA: Diagnosis not present

## 2016-05-27 DIAGNOSIS — E559 Vitamin D deficiency, unspecified: Secondary | ICD-10-CM | POA: Diagnosis not present

## 2016-05-27 DIAGNOSIS — F329 Major depressive disorder, single episode, unspecified: Secondary | ICD-10-CM | POA: Diagnosis not present

## 2016-05-27 DIAGNOSIS — J45909 Unspecified asthma, uncomplicated: Secondary | ICD-10-CM | POA: Diagnosis not present

## 2016-05-27 DIAGNOSIS — Z6841 Body Mass Index (BMI) 40.0 and over, adult: Secondary | ICD-10-CM | POA: Diagnosis not present

## 2016-05-27 DIAGNOSIS — G4733 Obstructive sleep apnea (adult) (pediatric): Secondary | ICD-10-CM | POA: Diagnosis not present

## 2016-05-27 DIAGNOSIS — Z90710 Acquired absence of both cervix and uterus: Secondary | ICD-10-CM | POA: Diagnosis not present

## 2016-05-27 DIAGNOSIS — Z9181 History of falling: Secondary | ICD-10-CM | POA: Diagnosis not present

## 2016-05-27 DIAGNOSIS — E785 Hyperlipidemia, unspecified: Secondary | ICD-10-CM | POA: Diagnosis not present

## 2016-05-27 DIAGNOSIS — Z961 Presence of intraocular lens: Secondary | ICD-10-CM | POA: Diagnosis not present

## 2016-05-27 DIAGNOSIS — E7800 Pure hypercholesterolemia, unspecified: Secondary | ICD-10-CM | POA: Diagnosis not present

## 2016-05-27 DIAGNOSIS — Z8673 Personal history of transient ischemic attack (TIA), and cerebral infarction without residual deficits: Secondary | ICD-10-CM | POA: Diagnosis not present

## 2016-05-27 DIAGNOSIS — Z6839 Body mass index (BMI) 39.0-39.9, adult: Secondary | ICD-10-CM | POA: Diagnosis not present

## 2016-05-27 DIAGNOSIS — Z9842 Cataract extraction status, left eye: Secondary | ICD-10-CM | POA: Diagnosis not present

## 2016-05-27 DIAGNOSIS — K219 Gastro-esophageal reflux disease without esophagitis: Secondary | ICD-10-CM | POA: Diagnosis not present

## 2016-05-27 DIAGNOSIS — G40409 Other generalized epilepsy and epileptic syndromes, not intractable, without status epilepticus: Secondary | ICD-10-CM | POA: Diagnosis not present

## 2016-05-27 DIAGNOSIS — F419 Anxiety disorder, unspecified: Secondary | ICD-10-CM | POA: Diagnosis not present

## 2016-05-27 DIAGNOSIS — I10 Essential (primary) hypertension: Secondary | ICD-10-CM | POA: Diagnosis not present

## 2016-05-27 DIAGNOSIS — Z9013 Acquired absence of bilateral breasts and nipples: Secondary | ICD-10-CM | POA: Diagnosis not present

## 2016-05-27 DIAGNOSIS — M797 Fibromyalgia: Secondary | ICD-10-CM | POA: Diagnosis not present

## 2016-05-27 DIAGNOSIS — K589 Irritable bowel syndrome without diarrhea: Secondary | ICD-10-CM | POA: Diagnosis not present

## 2016-05-27 DIAGNOSIS — Z9841 Cataract extraction status, right eye: Secondary | ICD-10-CM | POA: Diagnosis not present

## 2016-05-27 DIAGNOSIS — Z7982 Long term (current) use of aspirin: Secondary | ICD-10-CM | POA: Diagnosis not present

## 2016-05-27 HISTORY — PX: LAPAROSCOPIC GASTRIC SLEEVE RESECTION: SHX5895

## 2016-06-10 ENCOUNTER — Telehealth: Payer: Self-pay | Admitting: Internal Medicine

## 2016-06-10 ENCOUNTER — Other Ambulatory Visit: Payer: Self-pay | Admitting: Internal Medicine

## 2016-06-10 DIAGNOSIS — E118 Type 2 diabetes mellitus with unspecified complications: Secondary | ICD-10-CM

## 2016-06-10 NOTE — Telephone Encounter (Signed)
Patient called back in regard.  Please follow up at [phone].

## 2016-06-10 NOTE — Telephone Encounter (Signed)
Pt is rq rf for the clonazepam and the phenobarbital

## 2016-06-10 NOTE — Telephone Encounter (Signed)
appt scheduled

## 2016-06-10 NOTE — Telephone Encounter (Deleted)
Pt has an appt on 08/10/2016

## 2016-06-11 ENCOUNTER — Ambulatory Visit: Payer: PPO | Admitting: Internal Medicine

## 2016-06-11 DIAGNOSIS — E860 Dehydration: Secondary | ICD-10-CM | POA: Diagnosis not present

## 2016-06-11 DIAGNOSIS — J45909 Unspecified asthma, uncomplicated: Secondary | ICD-10-CM | POA: Diagnosis not present

## 2016-06-11 DIAGNOSIS — R51 Headache: Secondary | ICD-10-CM | POA: Diagnosis not present

## 2016-06-11 DIAGNOSIS — R42 Dizziness and giddiness: Secondary | ICD-10-CM | POA: Diagnosis not present

## 2016-06-11 DIAGNOSIS — Z8249 Family history of ischemic heart disease and other diseases of the circulatory system: Secondary | ICD-10-CM | POA: Diagnosis not present

## 2016-06-11 DIAGNOSIS — E785 Hyperlipidemia, unspecified: Secondary | ICD-10-CM | POA: Diagnosis not present

## 2016-06-11 DIAGNOSIS — R630 Anorexia: Secondary | ICD-10-CM | POA: Diagnosis not present

## 2016-06-11 DIAGNOSIS — Z8673 Personal history of transient ischemic attack (TIA), and cerebral infarction without residual deficits: Secondary | ICD-10-CM | POA: Diagnosis not present

## 2016-06-11 DIAGNOSIS — G40909 Epilepsy, unspecified, not intractable, without status epilepticus: Secondary | ICD-10-CM | POA: Diagnosis not present

## 2016-06-11 DIAGNOSIS — E119 Type 2 diabetes mellitus without complications: Secondary | ICD-10-CM | POA: Diagnosis not present

## 2016-06-11 DIAGNOSIS — I10 Essential (primary) hypertension: Secondary | ICD-10-CM | POA: Diagnosis not present

## 2016-06-11 DIAGNOSIS — M797 Fibromyalgia: Secondary | ICD-10-CM | POA: Diagnosis not present

## 2016-06-13 ENCOUNTER — Other Ambulatory Visit: Payer: Self-pay | Admitting: *Deleted

## 2016-06-13 NOTE — Patient Outreach (Signed)
[facility]) Care Management  06/13/2016  ANGELISA RAMPONE 11-Apr-1951 [id number]   Referral via [facility] member discharged from [place] facility; Hospital stay 12/5-06/2015  Telephone call to patient: left message on voice mail requesting call back.  Plan: Will follow up.   Sherrin Daisy, RN BSN [place] Management Coordinator [facility] Care Management  [phone]

## 2016-06-19 ENCOUNTER — Encounter: Payer: Self-pay | Admitting: *Deleted

## 2016-06-19 ENCOUNTER — Other Ambulatory Visit: Payer: Self-pay | Admitting: *Deleted

## 2016-06-19 NOTE — Patient Outreach (Signed)
[facility]) Care Management  06/19/2016  Brandi Ramsey 1950/07/30 [id number]  Telephone call to patient who was advised of reason for call & [facility] care management services. HIPPA verification received from patient.   Patient voices that she was recently discharged from [facility] 12/6 following gastric sleeve surgery. Voices that she was seen in ED 12/20 due to symptom of dizziness. States was advised that she was dehydrated. States she received 1 bag of intravenous fluids & was discharged to home. States she is currently sipping liquids every 15 minutes & following discharge diet advancement per MD instructions. States no further dizziness. States she is taking blood pressure & blood sugar and all have been   Voices that she has already followed up with her surgeon -Dr. Raul Ramsey- 06/04/16 and has follow up scheduled with primary care-Dr. Scarlette Ramsey 1/02. Voices that she received discharge instructions at discharge & voices understanding. States she is taking medications as prescribed & understands importance of taking as instructed. States she is not having any pain and has only taking 1 pain pill.   Voices that she has family support and that she knows when to call MD office if problems arise. States she is walking for exercise and that she is able to drive.   States no concerns at this time and that surgery went well.   Plan: Send contact information to patient. Close case.  Sherrin Daisy, RN BSN [name] [facility] Care Management  [phone]

## 2016-06-20 DIAGNOSIS — E860 Dehydration: Secondary | ICD-10-CM | POA: Diagnosis not present

## 2016-06-24 ENCOUNTER — Encounter: Payer: Self-pay | Admitting: Internal Medicine

## 2016-06-24 ENCOUNTER — Other Ambulatory Visit (INDEPENDENT_AMBULATORY_CARE_PROVIDER_SITE_OTHER): Payer: PPO

## 2016-06-24 ENCOUNTER — Ambulatory Visit (INDEPENDENT_AMBULATORY_CARE_PROVIDER_SITE_OTHER): Payer: PPO | Admitting: Internal Medicine

## 2016-06-24 VITALS — BP 92/60 | HR 96 | Temp 98.3°F | Resp 16 | Ht 64.0 in | Wt 197.4 lb

## 2016-06-24 DIAGNOSIS — E118 Type 2 diabetes mellitus with unspecified complications: Secondary | ICD-10-CM

## 2016-06-24 DIAGNOSIS — E860 Dehydration: Secondary | ICD-10-CM | POA: Diagnosis not present

## 2016-06-24 DIAGNOSIS — F411 Generalized anxiety disorder: Secondary | ICD-10-CM

## 2016-06-24 DIAGNOSIS — I10 Essential (primary) hypertension: Secondary | ICD-10-CM

## 2016-06-24 LAB — CBC WITH DIFFERENTIAL/PLATELET
Basophils Absolute: 0 10*3/uL (ref 0.0–0.1)
Basophils Relative: 0.3 % (ref 0.0–3.0)
EOS PCT: 0.6 % (ref 0.0–5.0)
Eosinophils Absolute: 0.1 10*3/uL (ref 0.0–0.7)
HCT: 44.2 % (ref 36.0–46.0)
Hemoglobin: 14.9 g/dL (ref 12.0–15.0)
LYMPHS ABS: 2.1 10*3/uL (ref 0.7–4.0)
Lymphocytes Relative: 24.2 % (ref 12.0–46.0)
MCHC: 33.8 g/dL (ref 30.0–36.0)
MCV: 83.9 fl (ref 78.0–100.0)
MONO ABS: 0.6 10*3/uL (ref 0.1–1.0)
Monocytes Relative: 7.1 % (ref 3.0–12.0)
NEUTROS ABS: 6 10*3/uL (ref 1.4–7.7)
NEUTROS PCT: 67.8 % (ref 43.0–77.0)
Platelets: 283 10*3/uL (ref 150.0–400.0)
RBC: 5.27 Mil/uL — ABNORMAL HIGH (ref 3.87–5.11)
RDW: 15.3 % (ref 11.5–15.5)
WBC: 8.8 10*3/uL (ref 4.0–10.5)

## 2016-06-24 LAB — BASIC METABOLIC PANEL
BUN: 12 mg/dL (ref 6–23)
CALCIUM: 9.6 mg/dL (ref 8.4–10.5)
CO2: 27 mEq/L (ref 19–32)
Chloride: 104 mEq/L (ref 96–112)
Creatinine, Ser: 1.04 mg/dL (ref 0.40–1.20)
GFR: 56.62 mL/min — AB (ref >60.00)
Glucose, Bld: 106 mg/dL — ABNORMAL HIGH (ref 70–99)
Potassium: 5 mEq/L (ref 3.5–5.1)
SODIUM: 137 meq/L (ref 135–145)

## 2016-06-24 LAB — HEMOGLOBIN A1C: Hgb A1c MFr Bld: 5.6 % (ref 4.6–6.5)

## 2016-06-24 MED ORDER — CLONAZEPAM 1 MG PO TABS: ORAL_TABLET | ORAL | 3 refills | Status: DC

## 2016-06-24 NOTE — Patient Instructions (Signed)
Hypertension Hypertension, commonly called high blood pressure, is when the force of blood pumping through your arteries is too strong. Your arteries are the blood vessels that carry blood from your heart throughout your body. A blood pressure reading consists of a higher number over a lower number, such as 110/72. The higher number (systolic) is the pressure inside your arteries when your heart pumps. The lower number (diastolic) is the pressure inside your arteries when your heart relaxes. Ideally you want your blood pressure below 120/80. Hypertension forces your heart to work harder to pump blood. Your arteries may become narrow or stiff. Having untreated or uncontrolled hypertension can cause heart attack, stroke, kidney disease, and other problems. What increases the risk? Some risk factors for high blood pressure are controllable. Others are not. Risk factors you cannot control include:  Race. You may be at higher risk if you are African American.  Age. Risk increases with age.  Gender. Men are at higher risk than women before age 45 years. After age 65, women are at higher risk than men. Risk factors you can control include:  Not getting enough exercise or physical activity.  Being overweight.  Getting too much fat, sugar, calories, or salt in your diet.  Drinking too much alcohol. What are the signs or symptoms? Hypertension does not usually cause signs or symptoms. Extremely high blood pressure (hypertensive crisis) may cause headache, anxiety, shortness of breath, and nosebleed. How is this diagnosed? To check if you have hypertension, your health care provider will measure your blood pressure while you are seated, with your arm held at the level of your heart. It should be measured at least twice using the same arm. Certain conditions can cause a difference in blood pressure between your right and left arms. A blood pressure reading that is higher than normal on one occasion does  not mean that you need treatment. If it is not clear whether you have high blood pressure, you may be asked to return on a different day to have your blood pressure checked again. Or, you may be asked to monitor your blood pressure at home for 1 or more weeks. How is this treated? Treating high blood pressure includes making lifestyle changes and possibly taking medicine. Living a healthy lifestyle can help lower high blood pressure. You may need to change some of your habits. Lifestyle changes may include:  Following the DASH diet. This diet is high in fruits, vegetables, and whole grains. It is low in salt, red meat, and added sugars.  Keep your sodium intake below 2,300 mg per day.  Getting at least 30-45 minutes of aerobic exercise at least 4 times per week.  Losing weight if necessary.  Not smoking.  Limiting alcoholic beverages.  Learning ways to reduce stress. Your health care provider may prescribe medicine if lifestyle changes are not enough to get your blood pressure under control, and if one of the following is true:  You are 18-59 years of age and your systolic blood pressure is above 140.  You are 60 years of age or older, and your systolic blood pressure is above 150.  Your diastolic blood pressure is above 90.  You have diabetes, and your systolic blood pressure is over 140 or your diastolic blood pressure is over 90.  You have kidney disease and your blood pressure is above 140/90.  You have heart disease and your blood pressure is above 140/90. Your personal target blood pressure may vary depending on your medical   conditions, your age, and other factors. Follow these instructions at home:  Have your blood pressure rechecked as directed by your health care provider.  Take medicines only as directed by your health care provider. Follow the directions carefully. Blood pressure medicines must be taken as prescribed. The medicine does not work as well when you skip  doses. Skipping doses also puts you at risk for problems.  Do not smoke.  Monitor your blood pressure at home as directed by your health care provider. Contact a health care provider if:  You think you are having a reaction to medicines taken.  You have recurrent headaches or feel dizzy.  You have swelling in your ankles.  You have trouble with your vision. Get help right away if:  You develop a severe headache or confusion.  You have unusual weakness, numbness, or feel faint.  You have severe chest or abdominal pain.  You vomit repeatedly.  You have trouble breathing. This information is not intended to replace advice given to you by your health care provider. Make sure you discuss any questions you have with your health care provider. Document Released: 06/09/2005 Document Revised: 11/15/2015 Document Reviewed: 04/01/2013 Elsevier Interactive Patient Education  2017 [employer]  

## 2016-06-24 NOTE — Progress Notes (Signed)
Pre visit review using our clinic review tool, if applicable. No additional management support is needed unless otherwise documented below in the visit note. 

## 2016-06-24 NOTE — Progress Notes (Signed)
Subjective:  Patient ID: Brandi Ramsey, female    DOB: [birth year]  Age: 66 y.o. MRN: [id number]  CC: Hypertension and Diabetes   HPI Brandi Ramsey presents for follow-up on hypertension and diabetes. She is one-month status post gastric sleeve for treatment of obesity. She has had a few episodes of nausea and vomiting and has received some IV fluids for dehydration but is overall improving and has felt well for the last few days. She has lost about 20 pounds.  Outpatient Medications Prior to Visit  Medication Sig Dispense Refill  . atorvastatin (LIPITOR) 80 MG tablet Take 1 tablet (80 mg total) by mouth daily at 6 PM. 90 tablet 1  . BREO ELLIPTA 100-25 MCG/INH AEPB Inhale 1 puff into the lungs daily.   11  . Calcium-Vitamin D 600-200 MG-UNIT per tablet Take 1 tablet by mouth 2 (two) times daily.    . DULoxetine (CYMBALTA) 30 MG capsule Take 1 capsule (30 mg total) by mouth daily. 30 capsule 5  . metoprolol succinate (TOPROL-XL) 25 MG 24 hr tablet Take 25 mg by mouth daily.    . pantoprazole (PROTONIX) 40 MG tablet TAKE 1 TABLET TWICE A DAY 60 tablet 11  . PHENobarbital (LUMINAL) 97.2 MG tablet TAKE 1 TABLET AT BEDTIME 30 tablet 5  . ranitidine (ZANTAC) 150 MG tablet Take 150 mg by mouth 2 (two) times daily as needed (for indigestion).     . clonazePAM (KLONOPIN) 1 MG tablet TAKE 1 TABLET TWICE A DAY AS NEEDED FOR ANXIETY 45 tablet 1  . telmisartan (MICARDIS) 40 MG tablet Take 1 tablet (40 mg total) by mouth daily. 90 tablet 3  . aspirin EC 81 MG EC tablet Take 1 tablet (81 mg total) by mouth daily. (Patient not taking: Reported on 06/24/2016)    . Melatonin 3 MG TABS Take 3 mg by mouth daily.     . Omega-3 Fatty Acids (FISH OIL) 1200 MG CAPS Take 3,000 mg by mouth daily.     . polyethylene glycol (MIRALAX / GLYCOLAX) packet Take 17 g by mouth daily.    . Vitamin D, Ergocalciferol, (DRISDOL) 50000 UNITS CAPS capsule Take 50,000 Units by mouth once a week. Reported on 08/24/2015    .  ciprofloxacin-hydrocortisone (CIPRO HC OTIC) otic suspension Place 3 drops into the left ear 2 (two) times daily. 10 mL 0  . Coenzyme Q10 (CO Q 10 PO) Take by mouth.    . Exenatide ER 2 MG PEN Inject 1 Act into the skin once a week. (Patient not taking: Reported on 06/24/2016) 12 each 3  . metFORMIN (GLUCOPHAGE) 500 MG tablet TAKE 1 TABLET TWICE A DAY WITH FOOD (Patient not taking: Reported on 06/24/2016) 180 tablet 0  . metoCLOPramide (REGLAN) 10 MG tablet Take as directed per prep instructions 2 tablet 0  . oxyCODONE (OXY IR/ROXICODONE) 5 MG immediate release tablet Take 1 tablet (5 mg total) by mouth every 4 (four) hours as needed for severe pain. (Patient not taking: Reported on 06/24/2016) 30 tablet 0   No facility-administered medications prior to visit.     ROS Review of Systems  Constitutional: Negative for activity change, appetite change, diaphoresis, fatigue and unexpected weight change.  HENT: Negative.  Negative for trouble swallowing.   Eyes: Negative for visual disturbance.  Respiratory: Negative for cough, chest tightness, shortness of breath, wheezing and stridor.   Cardiovascular: Negative for chest pain, palpitations and leg swelling.  Gastrointestinal: Positive for nausea and vomiting. Negative for abdominal pain,  constipation, diarrhea and rectal pain.  Endocrine: Negative.  Negative for cold intolerance, heat intolerance, polydipsia, polyphagia and polyuria.  Genitourinary: Negative.  Negative for decreased urine volume, difficulty urinating and urgency.  Musculoskeletal: Negative.  Negative for back pain and neck pain.  Skin: Negative.  Negative for color change and rash.  Allergic/Immunologic: Negative.   Neurological: Negative.  Negative for dizziness, weakness and light-headedness.  Hematological: Negative.  Negative for adenopathy. Does not bruise/bleed easily.  Psychiatric/Behavioral: Negative for behavioral problems, decreased concentration, dysphoric mood,  self-injury, sleep disturbance and suicidal ideas. The patient is nervous/anxious.     Objective:  BP 92/60   Pulse 96   Temp 98.3 F (36.8 C) (Oral)   Resp 16   Ht 5\' 4"  (1.626 m)   Wt 197 lb 6.4 oz (89.5 kg)   SpO2 98%   BMI 33.88 kg/m   BP Readings from Last 3 Encounters:  06/24/16 92/60  02/21/16 (!) 148/100  02/11/16 (!) 140/98    Wt Readings from Last 3 Encounters:  06/24/16 197 lb 6.4 oz (89.5 kg)  02/21/16 225 lb 8 oz (102.3 kg)  02/11/16 224 lb (101.6 kg)    Physical Exam  Constitutional: She is oriented to person, place, and time. No distress.  HENT:  Mouth/Throat: Oropharynx is clear and moist. No oropharyngeal exudate.  Eyes: Conjunctivae are normal. Right eye exhibits no discharge. Left eye exhibits no discharge. No scleral icterus.  Neck: Normal range of motion. Neck supple. No JVD present. No tracheal deviation present. No thyromegaly present.  Cardiovascular: Normal rate, regular rhythm, normal heart sounds and intact distal pulses.  Exam reveals no gallop and no friction rub.   No murmur heard. Pulmonary/Chest: Effort normal and breath sounds normal. No stridor. No respiratory distress. She has no wheezes. She has no rales. She exhibits no tenderness.  Abdominal: Soft. Bowel sounds are normal. She exhibits no distension and no mass. There is no tenderness. There is no rebound and no guarding.  Musculoskeletal: Normal range of motion. She exhibits no edema, tenderness or deformity.  Lymphadenopathy:    She has no cervical adenopathy.  Neurological: She is oriented to person, place, and time.  Skin: Skin is warm and dry. No rash noted. She is not diaphoretic. No erythema. No pallor.  Psychiatric: Her behavior is normal. Judgment and thought content normal. Her mood appears anxious. Her speech is not rapid and/or pressured, not delayed and not tangential. Cognition and memory are normal.  Vitals reviewed.   Lab Results  Component Value Date   WBC 8.8  06/24/2016   HGB 14.9 06/24/2016   HCT 44.2 06/24/2016   PLT 283.0 06/24/2016   GLUCOSE 106 (H) 06/24/2016   CHOL 171 09/05/2015   TRIG 132.0 09/05/2015   HDL 44.60 09/05/2015   LDLCALC 100 (H) 09/05/2015   ALT 21 08/22/2015   AST 18 08/22/2015   NA 137 06/24/2016   K 5.0 06/24/2016   CL 104 06/24/2016   CREATININE 1.04 06/24/2016   BUN 12 06/24/2016   CO2 27 06/24/2016   TSH 0.94 09/05/2015   HGBA1C 5.6 06/24/2016   MICROALBUR 1.4 09/05/2015    Mr Brain W Wo Contrast  Result Date: 03/16/2015 CLINICAL DATA:  66 year old diabetic hypertensive female with fragile X syndrome, multiple sclerosis presenting with post concussion syndrome after falling backwards 7 weeks ago. Dizziness. Posterior head and neck pain. Subsequent encounter. Creatinine was obtained on site at [place] at [address]Results: Creatinine 1.0 mg/dL. EXAM: MRI HEAD WITHOUT  AND WITH CONTRAST MRI CERVICAL SPINE WITHOUT AND WITH CONTRAST TECHNIQUE: Multiplanar, multiecho pulse sequences of the brain and surrounding structures, and cervical spine, to include the craniocervical junction and cervicothoracic junction, were obtained without and with intravenous contrast. CONTRAST:  72mL MULTIHANCE GADOBENATE DIMEGLUMINE 529 MG/ML IV SOLN COMPARISON:  01/29/2015 CT.  08/27/2013 in 09/2009 2012 brain MR FINDINGS: MRI HEAD FINDINGS Some of the sequences are motion degraded. No acute infarct. No intracranial hemorrhage. Slight progression of moderate nonspecific white matter type changes most notable periventricular region. In a diabetic hypertensive patient of this age, findings may reflect result of small vessel disease. With history multiple sclerosis, demyelinating process is also consideration although appearance is not specific for such. None of these areas demonstrate enhancement or restricted motion as can be seen with areas of active demyelination. None of these areas demonstrate mass effect as can be seen  with progressive multifocal leukoencephalopathy. No intracranial mass or abnormal enhancement. Hyperostosis frontalis interna incidentally noted. Mild global atrophy most notable frontal -temporal lobes without hydrocephalus. Major intracranial vascular structures are patent. Cervical medullary junction, pituitary region, pineal region and orbital structures unremarkable. Post lens replacement. No evidence of mesial temporal sclerosis. MRI CERVICAL SPINE FINDINGS Some of the sequences are motion degraded. Cervical medullary junction unremarkable. Mild motion artifact extends through the cervical cord without definitive focal cord signal abnormality. No abnormal enhancement. Mild kyphosis cervical spine centered at C5 level. Left C4 facet slightly anteriorly located. This appears at most minimally different from the 08/27/2013 examination. If there were any suspicion of instability, flexion/ extension views can be obtained for further delineation. There is no evidence of bony edema soft tissue edema to suggest acute osseous or soft tissue injury. C2-3:  Negative. C3-4: Shallow disc osteophyte greater right paracentral position. Slight narrowing ventral aspect of the thecal sac without cord compression. C4-5: Shallow disc osteophyte complex slightly greater to right. Mild narrowing ventral aspect of the thecal sac. C5-6: Broad-based disc osteophyte complex greatest extension left paracentral position. Narrowing ventral aspect of the thecal sac greater on left with mild left-sided cord flattening. Left uncinate hypertrophy. Moderate left foraminal narrowing. C6-7: Broad-based disc osteophyte complex slightly greater to left. Narrowing ventral aspect of the thecal sac without cord compression. Mild foraminal narrowing greater on the left. C7-T1: Mild posterior element hypertrophy greater on the left. Slight impression dorsal aspect of the thecal sac greater on the left. IMPRESSION: MRI HEAD FINDINGS Some of the  sequences are motion degraded. Slight progression of moderate nonspecific white matter type changes most notable periventricular region. In a diabetic hypertensive patient of this age, findings may reflect result of small vessel disease. With history multiple sclerosis, demyelinating process is also consideration although appearance is not specific for such. No acute infarct or intracranial hemorrhage. No intracranial mass or abnormal enhancement. Mild global atrophy. MRI CERVICAL SPINE Some of the sequences are motion degraded. Mild motion artifact extends through the cervical cord without definitive focal cord signal abnormality. No abnormal enhancement. Mild kyphosis cervical spine centered at C5 level. Left C4 facet slightly anteriorly located. This appears at most minimally different from the 08/27/2013 examination. If there were any suspicion of instability, flexion/ extension views can be obtained for further delineation. There is no evidence of bony edema soft tissue edema to suggest acute osseous or soft tissue injury. Cervical spondylotic changes most notable C5-6 level. Summary of pertinent findings includes: C3-4 shallow disc osteophyte greater right paracentral position. Slight narrowing ventral aspect of the thecal sac without cord compression. C4-5  shallow disc osteophyte complex slightly greater to right. Mild narrowing ventral aspect of the thecal sac. C5-6 broad-based disc osteophyte complex greatest extension left paracentral position. Narrowing ventral aspect of the thecal sac greater on left with mild left-sided cord flattening. Left uncinate hypertrophy. Moderate left foraminal narrowing. C6-7 broad-based disc osteophyte complex slightly greater to left. Narrowing ventral aspect of the thecal sac without cord compression. Mild foraminal narrowing greater on the left. C7-T1 mild posterior element hypertrophy greater on the left. Slight impression dorsal aspect of the thecal sac greater on the  left. Electronically Signed   By: Genia Del M.D.   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 Dizziness. Posterior head and neck pain. Subsequent encounter. Creatinine was obtained on site at [place] at [address]Results: Creatinine 1.0 mg/dL. EXAM: MRI HEAD WITHOUT AND WITH CONTRAST MRI CERVICAL SPINE WITHOUT AND WITH CONTRAST TECHNIQUE: Multiplanar, multiecho pulse sequences of the brain and surrounding structures, and cervical spine, to include the craniocervical junction and cervicothoracic junction, were obtained without and with intravenous contrast. CONTRAST:  64mL MULTIHANCE GADOBENATE DIMEGLUMINE 529 MG/ML IV SOLN COMPARISON:  01/29/2015 CT.  08/27/2013 in 09/2009 2012 brain MR FINDINGS: MRI HEAD FINDINGS Some of the sequences are motion degraded. No acute infarct. No intracranial hemorrhage. Slight progression of moderate nonspecific white matter type changes most notable periventricular region. In a diabetic hypertensive patient of this age, findings may reflect result of small vessel disease. With history multiple sclerosis, demyelinating process is also consideration although appearance is not specific for such. None of these areas demonstrate enhancement or restricted motion as can be seen with areas of active demyelination. None of these areas demonstrate mass effect as can be seen with progressive multifocal leukoencephalopathy. No intracranial mass or abnormal enhancement. Hyperostosis frontalis interna incidentally noted. Mild global atrophy most notable frontal -temporal lobes without hydrocephalus. Major intracranial vascular structures are patent. Cervical medullary junction, pituitary region, pineal region and orbital structures unremarkable. Post lens  replacement. No evidence of mesial temporal sclerosis. MRI CERVICAL SPINE FINDINGS Some of the sequences are motion degraded. Cervical medullary junction unremarkable. Mild motion artifact extends through the cervical cord without definitive focal cord signal abnormality. No abnormal enhancement. Mild kyphosis cervical spine centered at C5 level. Left C4 facet slightly anteriorly located. This appears at most minimally different from the 08/27/2013 examination. If there were any suspicion of instability, flexion/ extension views can be obtained for further delineation. There is no evidence of bony edema soft tissue edema to suggest acute osseous or soft tissue injury. C2-3:  Negative. C3-4: Shallow disc osteophyte greater right paracentral position. Slight narrowing ventral aspect of the thecal sac without cord compression. C4-5: Shallow disc osteophyte complex slightly greater to right. Mild narrowing ventral aspect of the thecal sac. C5-6: Broad-based disc osteophyte complex greatest extension left paracentral position. Narrowing ventral aspect of the thecal sac greater on left with mild left-sided cord flattening. Left uncinate hypertrophy. Moderate left foraminal narrowing. C6-7: Broad-based disc osteophyte complex slightly greater to left. Narrowing ventral aspect of the thecal sac without cord compression. Mild foraminal narrowing greater on the left. C7-T1: Mild posterior element hypertrophy greater on the left. Slight impression dorsal aspect of the thecal sac greater on the left. IMPRESSION: MRI HEAD FINDINGS Some of the sequences are motion degraded. Slight progression of moderate  nonspecific white matter type changes most notable periventricular region. In a diabetic hypertensive patient of this age, findings may reflect result of small vessel disease. With history multiple sclerosis, demyelinating process is also consideration although appearance is not specific for such. No acute infarct or intracranial  hemorrhage. No intracranial mass or abnormal enhancement. Mild global atrophy. MRI CERVICAL SPINE Some of the sequences are motion degraded. Mild motion artifact extends through the cervical cord without definitive focal cord signal abnormality. No abnormal enhancement. Mild kyphosis cervical spine centered at C5 level. Left C4 facet slightly anteriorly located. This appears at most minimally different from the 08/27/2013 examination. If there were any suspicion of instability, flexion/ extension views can be obtained for further delineation. There is no evidence of bony edema soft tissue edema to suggest acute osseous or soft tissue injury. Cervical spondylotic changes most notable C5-6 level. Summary of pertinent findings includes: C3-4 shallow disc osteophyte greater right paracentral position. Slight narrowing ventral aspect of the thecal sac without cord compression. C4-5 shallow disc osteophyte complex slightly greater to right. Mild narrowing ventral aspect of the thecal sac. C5-6 broad-based disc osteophyte complex greatest extension left paracentral position. Narrowing ventral aspect of the thecal sac greater on left with mild left-sided cord flattening. Left uncinate hypertrophy. Moderate left foraminal narrowing. C6-7 broad-based disc osteophyte complex slightly greater to left. Narrowing ventral aspect of the thecal sac without cord compression. Mild foraminal narrowing greater on the left. C7-T1 mild posterior element hypertrophy greater on the left. Slight impression dorsal aspect of the thecal sac greater on the left. Electronically Signed   By: Genia Del M.D.   On: 03/16/2015 00:05    Assessment & Plan:   Jullianna was seen today for hypertension and diabetes.  Diagnoses and all orders for this visit:  Essential (primary) hypertension- Her blood pressure is low now, her electrolytes and renal function are normal, I've asked her to stop taking the ARB. -     Cancel: Basic metabolic panel;  Future -     CBC with Differential/Platelet; Future -     Basic metabolic panel; Future  Type 2 diabetes mellitus with complication, without long-term current use of insulin ([employer])- her A1c is down to %.6%, I've asked her to stop taking metformin and bydureon -     Cancel: Basic metabolic panel; Future -     Cancel: Hemoglobin A1c; Future -     Basic metabolic panel; Future -     Hemoglobin A1c; Future  GAD (generalized anxiety disorder)- continue Klonopin as needed. -     clonazePAM (KLONOPIN) 1 MG tablet; TAKE 1 TABLET TWICE A DAY AS NEEDED FOR ANXIETY   I have discontinued Ms. Barasch's Coenzyme Q10 (CO Q 10 PO), metoCLOPramide, telmisartan, Exenatide ER, oxyCODONE, ciprofloxacin-hydrocortisone, and metFORMIN. I am also having her maintain her Vitamin D (Ergocalciferol), Calcium-Vitamin D, ranitidine, BREO ELLIPTA, aspirin, Fish Oil, Melatonin, polyethylene glycol, atorvastatin, metoprolol succinate, DULoxetine, pantoprazole, PHENobarbital, and clonazePAM.  Meds ordered this encounter  Medications  . clonazePAM (KLONOPIN) 1 MG tablet    Sig: TAKE 1 TABLET TWICE A DAY AS NEEDED FOR ANXIETY    Dispense:  45 tablet    Refill:  3    Not to exceed 4 additional fills before 07/08/2016     Follow-up: Return in about 6 weeks (around 08/05/2016).  Scarlette Calico, MD

## 2016-06-26 DIAGNOSIS — E860 Dehydration: Secondary | ICD-10-CM | POA: Diagnosis not present

## 2016-06-27 DIAGNOSIS — E860 Dehydration: Secondary | ICD-10-CM | POA: Diagnosis not present

## 2016-06-28 DIAGNOSIS — E860 Dehydration: Secondary | ICD-10-CM | POA: Diagnosis not present

## 2016-06-29 DIAGNOSIS — E860 Dehydration: Secondary | ICD-10-CM | POA: Diagnosis not present

## 2016-07-01 DIAGNOSIS — E860 Dehydration: Secondary | ICD-10-CM | POA: Diagnosis not present

## 2016-07-02 DIAGNOSIS — E860 Dehydration: Secondary | ICD-10-CM | POA: Diagnosis not present

## 2016-07-14 DIAGNOSIS — I10 Essential (primary) hypertension: Secondary | ICD-10-CM | POA: Diagnosis not present

## 2016-07-14 DIAGNOSIS — E669 Obesity, unspecified: Secondary | ICD-10-CM | POA: Diagnosis not present

## 2016-07-14 DIAGNOSIS — E119 Type 2 diabetes mellitus without complications: Secondary | ICD-10-CM | POA: Diagnosis not present

## 2016-07-28 ENCOUNTER — Other Ambulatory Visit: Payer: Self-pay | Admitting: Internal Medicine

## 2016-07-29 ENCOUNTER — Other Ambulatory Visit: Payer: Self-pay | Admitting: Internal Medicine

## 2016-08-08 ENCOUNTER — Telehealth: Payer: Self-pay | Admitting: *Deleted

## 2016-08-08 NOTE — Telephone Encounter (Signed)
Recommend acute visit as these should not be refilled.

## 2016-08-08 NOTE — Telephone Encounter (Signed)
Pt left msg on triage stating MD had given ger some cipro drops for her ear. Ear is starting to hurt again, and she is leaving for the [place] for 10 days. Requesting to get a refill on the drops sent to [facility]. MD out office pls advise...Johny Chess

## 2016-08-08 NOTE — Telephone Encounter (Signed)
Called pt gave MD response. Pt state she rather for Dr. Ronnald Ramp to address the msg he is familiar with her ear situation. She hate to come in due to her immune system being down bcz of the weight loss surgery. She will not be leaving fr her trip until Tuesday, but she hate to get on the plane without it. Inform pt will send to MD desktop and wait for his response on Monday....../lmb.

## 2016-08-11 ENCOUNTER — Other Ambulatory Visit: Payer: Self-pay | Admitting: Internal Medicine

## 2016-08-11 DIAGNOSIS — H60392 Other infective otitis externa, left ear: Secondary | ICD-10-CM

## 2016-08-11 DIAGNOSIS — H9202 Otalgia, left ear: Secondary | ICD-10-CM | POA: Insufficient documentation

## 2016-08-11 NOTE — Telephone Encounter (Signed)
Pt called in , informed pt of Dr response.  She said that she will be happy to go to the ENT when she needs back but she is afraid to fly of travel with her ears hurting.  She wants drops or antibiotic or something just in case they get back on her flight.

## 2016-08-11 NOTE — Telephone Encounter (Signed)
She needs to see ENT.

## 2016-08-11 NOTE — Telephone Encounter (Signed)
What can I advise patient? 

## 2016-08-11 NOTE — Telephone Encounter (Signed)
I want her to see an ENT doctor about this Referral sent

## 2016-08-11 NOTE — Telephone Encounter (Signed)
Called pt no answer LMOM w/MD response../lmb 

## 2016-08-11 NOTE — Telephone Encounter (Signed)
Left detailed message with PCP response.   Closing note.

## 2016-08-13 ENCOUNTER — Ambulatory Visit: Payer: PPO | Admitting: Neurology

## 2016-09-01 ENCOUNTER — Other Ambulatory Visit: Payer: Self-pay | Admitting: Internal Medicine

## 2016-09-01 DIAGNOSIS — F320 Major depressive disorder, single episode, mild: Secondary | ICD-10-CM

## 2016-09-01 MED ORDER — DULOXETINE HCL 30 MG PO CPEP: 30.0000 mg | ORAL_CAPSULE | Freq: Every day | ORAL | 1 refills | Status: DC

## 2016-09-05 ENCOUNTER — Encounter: Payer: Self-pay | Admitting: Internal Medicine

## 2016-09-05 DIAGNOSIS — S62327A Displaced fracture of shaft of fifth metacarpal bone, left hand, initial encounter for closed fracture: Secondary | ICD-10-CM | POA: Diagnosis not present

## 2016-09-05 DIAGNOSIS — M25542 Pain in joints of left hand: Secondary | ICD-10-CM | POA: Diagnosis not present

## 2016-09-05 DIAGNOSIS — M25522 Pain in left elbow: Secondary | ICD-10-CM | POA: Diagnosis not present

## 2016-09-05 DIAGNOSIS — M79642 Pain in left hand: Secondary | ICD-10-CM | POA: Diagnosis not present

## 2016-09-18 ENCOUNTER — Encounter: Payer: Self-pay | Admitting: Internal Medicine

## 2016-09-23 ENCOUNTER — Other Ambulatory Visit: Payer: Self-pay | Admitting: Internal Medicine

## 2016-09-23 ENCOUNTER — Telehealth: Payer: Self-pay | Admitting: Internal Medicine

## 2016-09-23 DIAGNOSIS — Z20828 Contact with and (suspected) exposure to other viral communicable diseases: Secondary | ICD-10-CM

## 2016-09-23 MED ORDER — OSELTAMIVIR PHOSPHATE 75 MG PO CAPS: 75.0000 mg | ORAL_CAPSULE | Freq: Every day | ORAL | 0 refills | Status: AC

## 2016-09-23 NOTE — Telephone Encounter (Signed)
Pt called and was exposed to flu a.  She has had the weight loss surgery.  Her surgeon suggested she call her primary to get tamaflu.  She has been in hosp for dehydration 2 time already   [place]

## 2016-09-23 NOTE — Telephone Encounter (Signed)
Pt informed and she want to let you know that you are the greatest doctor.

## 2016-09-23 NOTE — Telephone Encounter (Signed)
RX sent

## 2016-09-29 DIAGNOSIS — S62307D Unspecified fracture of fifth metacarpal bone, left hand, subsequent encounter for fracture with routine healing: Secondary | ICD-10-CM | POA: Diagnosis not present

## 2016-10-15 ENCOUNTER — Other Ambulatory Visit: Payer: PPO

## 2016-10-15 ENCOUNTER — Encounter: Payer: Self-pay | Admitting: Neurology

## 2016-10-15 ENCOUNTER — Ambulatory Visit (INDEPENDENT_AMBULATORY_CARE_PROVIDER_SITE_OTHER): Payer: PPO | Admitting: Neurology

## 2016-10-15 VITALS — BP 108/68 | HR 89 | Temp 98.1°F | Resp 16 | Ht 68.0 in | Wt 170.0 lb

## 2016-10-15 DIAGNOSIS — Q992 Fragile X chromosome: Secondary | ICD-10-CM | POA: Diagnosis not present

## 2016-10-15 DIAGNOSIS — G43909 Migraine, unspecified, not intractable, without status migrainosus: Secondary | ICD-10-CM

## 2016-10-15 DIAGNOSIS — G119 Hereditary ataxia, unspecified: Principal | ICD-10-CM

## 2016-10-15 DIAGNOSIS — G40309 Generalized idiopathic epilepsy and epileptic syndromes, not intractable, without status epilepticus: Secondary | ICD-10-CM | POA: Diagnosis not present

## 2016-10-15 DIAGNOSIS — Z79899 Other long term (current) drug therapy: Secondary | ICD-10-CM

## 2016-10-15 DIAGNOSIS — G250 Essential tremor: Principal | ICD-10-CM

## 2016-10-15 LAB — HEPATIC FUNCTION PANEL
ALBUMIN: 4.2 g/dL (ref 3.5–5.2)
ALK PHOS: 100 U/L (ref 39–117)
ALT: 23 U/L (ref 0–35)
AST: 25 U/L (ref 0–37)
BILIRUBIN DIRECT: 0.1 mg/dL (ref 0.0–0.3)
Total Bilirubin: 0.3 mg/dL (ref 0.2–1.2)
Total Protein: 7.4 g/dL (ref 6.0–8.3)

## 2016-10-15 NOTE — Patient Instructions (Addendum)
1.  Continue phenobarbital 97.5mg  at bedtime.  Take calcium 500mg  twice daily and vitamin D 600 IU twice daily.  Will check hepatic panel and repeat DEXA scan 2.  Continue 30mg  daily for headache prevention 3.  Follow up in one year or as needed.

## 2016-10-15 NOTE — Progress Notes (Signed)
NEUROLOGY FOLLOW UP OFFICE NOTE  Brandi Ramsey [id number]  HISTORY OF PRESENT ILLNESS: Brandi Ramsey is a 66 year old woman with hypercholesterolemia, type II diabetes mellitus, remote seizure disorder, migraine, depression, and history of TIA who follows up for fragile X tremor-associated syndrome, tension-type headache, and history of seizures and concussion.      UPDATE: For tension-type headaches, she is taking  Cymbalta.  She is not taking venlafaxine.  Headaches have been controlled. Postconcussive symptoms have improved.  She feels that her vision is getting worse and will be going back to see the eye doctor.  She has not had any recurrent seizures.  She is taking the phenobarbital but ran out of the calcium and vitamin D.  Her husband has had acute cognitive decline over the past year, which has been extremely upsetting.  Workup has been negative.  He is currently being seen by Dr. Jimmey Ramsey at the [facility].  Physically, she is doing well.  She has lost over 60 lbs. 06/24/16 Labs:  CBC with WBC 8.8, HGB 14.9, HCT 44.2, PLT 283; BMP with Na 137, K 5, Cl 104, CO2 27, glucose 106, BUN 12, Cr 1.04.   HISTORY: Her son has fragile X syndrome with severe autism.  About 22 years ago, she began experiencing dizzy spells.  She apparently had imaging of the head which was suspicious for possible multiple sclerosis.  She had lumbar puncture, which was negative.  About 10 years ago, she developed mild tremor and unsteadiness.  She was worked up by Brandi Ramsey, a fragile X specialist at [facility], who diagnosed her with fragile X-associated tremor ataxia syndrome.  Work up included genetic testing for FMR1 gene premutation.    She has had a progressive course over the years.  She will have dizzy spells, described as lightheadedness, which will cause falls.  She has fractured her wrists and leg.  She also has tremor of the hands, causing difficulty with writing.  She also developed dysphonia and  was found to have tremor involving her "voice box".  She reports prior episodes of both bowel and bladder incontinence, which are rare.  She also reports poor depth perception.  For example, she may sometimes drive too close to the curb or she may accidentally bump into somebody when walking through the grocery store aisle.  She reports increased fatigue and weight gain.  She has depression and mood swings.  She also reports short-term memory problems.  She also has problems with swallowing and has choked on some occasions, requiring the Heimlich maneuver.   She has a remote history of epilepsy.  She had convulsions as a baby and two generalized tonic-clonic seizures in her early twenties.  She has been on phenobarbital ever since.  No recurrent seizures.   A DEXA scan was performed in 2016 and was negative.  She continues on phenobarbital 97.5mg  at bedtime.   She also has history of migraine, described as severe pressure and throbbing, over her left eye.  In March 2015, she had such a headache associated with right sided facial numbness and lower facial weakness.  She was admitted to [facility] for TIA.  CT of the head was unremarkable.  MRI of the brain showed periventricular and subcortical hyperintensities.  MRA of the head showed no major intracranial arterial stenosis, however there is question of mild narrowing of the left cavernous ICA.  Carotid duplex showed 1-39% bilateral ICA stenosis.  2D echo showed LVEF of  60-65% with moderate LVH.     She suffered postconcussion syndrome since slipping and hitting the back of her head in August 2016.  She says she passed afterwards for a few seconds.  Afterwards, she noted headache, spinning sensation, nausea and she vomited.  She went to the ED where CT of the head and cervical spine showed no acute findings.  She was discharged home with meclizine and hydrocodone.  Since she has been home, she still notes constant dull posterior headache.  She takes ibuprofen  for it daily but not the hydrocodone.  The dizziness is improving.  She has been more emotional and was crying easily.  She is seeing Brandi Ramsey of Sports Medicine for management. Due to concerns of possible prior history of MS, she underwent MRI of the brain and cervical spine with and without contrast on 03/15/15.  There was slight progression of nonspecific cerebral white matter changes, most probably chronic small vessel disease, compared to 2012.  Cervical spine showed no cord lesions.    Past medications:  venlafaxine  PAST MEDICAL HISTORY: Past Medical History:  Diagnosis Date  . Anxiety   . Arthritis   . Cerebellar ataxia ([place])   . Colon polyps   . Depression   . Diabetes mellitus without complication ([place])   . Fibromyalgia   . Fragile X syndrome    ataxia syndrome.    . Gastric ulcer   . Hemorrhoids   . History of Doppler ultrasound    a. Carotid US 3/15: mild soft plaque origin ICA. 1-39% ICA stenosis.  [name] History of echocardiogram    a. Echo 3/15: Moderate LVH, focal basal hypertrophy, EF 60-65%, normal wall motion  . History of nuclear stress test    a. Myoview 5/16: Normal perfusion, EF 78%, low risk  . Hyperlipidemia   . Hypertension   . MS (multiple sclerosis) ([place])   . Obesity   . Pneumonia   . Seizures ([place])   . Stroke ([place])   . Transient cerebral ischemia   . Vertigo, benign paroxysmal     MEDICATIONS: Current Outpatient Prescriptions on File Prior to Visit  Medication Sig Dispense Refill  . aspirin EC 81 MG EC tablet Take 1 tablet (81 mg total) by mouth daily.    [name] atorvastatin (LIPITOR) 80 MG tablet Take 1 tablet (80 mg total) by mouth daily at 6 PM. 90 tablet 1  . BREO ELLIPTA 100-25 MCG/INH AEPB Inhale 1 puff into the lungs daily.   11  . Calcium-Vitamin D 600-200 MG-UNIT per tablet Take 1 tablet by mouth 2 (two) times daily.    . clonazePAM (KLONOPIN) 1 MG tablet TAKE 1 TABLET TWICE A DAY AS NEEDED FOR ANXIETY 45 tablet 3  . DULoxetine  (CYMBALTA) 30 MG capsule Take 1 capsule (30 mg total) by mouth daily. 90 capsule 1  . pantoprazole (PROTONIX) 40 MG tablet TAKE 1 TABLET TWICE A DAY 60 tablet 11  . PHENobarbital (LUMINAL) 97.2 MG tablet TAKE 1 TABLET AT BEDTIME 30 tablet 5  . polyethylene glycol (MIRALAX / GLYCOLAX) packet Take 17 g by mouth daily.    . ranitidine (ZANTAC) 150 MG tablet Take 150 mg by mouth 2 (two) times daily as needed (for indigestion).     . Vitamin D, Ergocalciferol, (DRISDOL) 50000 UNITS CAPS capsule Take 50,000 Units by mouth once a week. Reported on 08/24/2015     No current facility-administered medications on file prior to visit.     ALLERGIES: Allergies  Allergen  Reactions  . Buprenorphine Hcl Hives and Itching  . Hydromorphone     Could not focus, "Seeing scary things."  Noises louder. Altered mental status  . Meperidine Hives and Itching    Other reaction(s): HIVES,ITCHING  . Metformin And Related Diarrhea  . Morphine And Related Hives and Itching    States only when mixed with Demerol  . Pentazocine Anaphylaxis  . Pentazocine Lactate Anaphylaxis  . Phenytoin Hives    Other reaction(s): RASH  . Demerol Hives and Itching  . Dilaudid [Hydromorphone Hcl]     Hallucinations and felt crazy   . Doxycycline Hyclate     Other reaction(s): GI UPSET,NAUSEA,VOMITING  . Ibuprofen     Contraindicated for risk of GI bleeding.  [name] Phenytoin Sodium Extended Hives  . Codeine Rash and Nausea Only    When mixed with Demerol Patient reports no reaction with Codeine/APAP (Tylenol #3)  . Doxycycline Hyclate Nausea And Vomiting    Other reaction(s): GI UPSET,NAUSEA,VOMITING Other reaction(s): GI UPSET,NAUSEA,VOMITING    FAMILY HISTORY: Family History  Problem Relation Age of Onset  . Raynaud syndrome Mother   . Colon polyps Mother   . Heart attack Mother   . Alzheimer's disease Father   . Diabetes Father   . Colon polyps Father   . Stroke    . Fragile X syndrome Son     Patient is carrier  .  Diabetes Sister   . Diabetes Brother   . Cervical cancer Maternal Grandmother   . Breast cancer Maternal Grandmother   . Heart failure Maternal Grandfather   . Diabetes Paternal Grandmother   . Colon polyps Sister   . Colon polyps Brother   . Colon cancer Maternal Aunt     SOCIAL HISTORY: Social History   Social History  . Marital status: Married    Spouse name: N/A  . Number of children: N/A  . Years of education: N/A   Occupational History  . Not on file.   Social History Main Topics  . Smoking status: Never Smoker  . Smokeless tobacco: Never Used  . Alcohol use No  . Drug use: No  . Sexual activity: Yes    Partners: Male   Other Topics Concern  . 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   Son with special needs   Married       REVIEW OF SYSTEMS: Constitutional: No fevers, chills, or sweats, no generalized fatigue, change in appetite Eyes: as above Ear, nose and throat: No hearing loss, ear pain, nasal congestion, sore throat Cardiovascular: No chest pain, palpitations Respiratory:  No shortness of breath at rest or with exertion, wheezes GastrointestinaI: No nausea, vomiting, diarrhea, abdominal pain, fecal incontinence Genitourinary:  No dysuria, urinary retention or frequency Musculoskeletal:  No neck pain, back pain Integumentary: No rash, pruritus, skin lesions Neurological: as above Psychiatric: No depression, insomnia, anxiety Endocrine: No palpitations, fatigue, diaphoresis, mood swings, change in appetite, change in weight, increased thirst Hematologic/Lymphatic:  No purpura, petechiae. Allergic/Immunologic: no itchy/runny eyes, nasal congestion, recent allergic reactions, rashes  PHYSICAL EXAM: Vitals:   10/15/16 1054  BP: 108/68  Pulse: 89  Resp: 16  Temp: 98.1 F (36.7 C)   General: No acute distress.  Patient appears well-groomed.  Head:  Normocephalic/atraumatic Eyes:  Fundi examined but  not visualized Neck: supple, no paraspinal tenderness, full range of motion Heart:  Regular rate and rhythm Lungs:  Clear to auscultation bilaterally Back: No paraspinal  tenderness Neurological Exam: alert and oriented to person, place, and time. Attention span and concentration intact, recent and remote memory intact, fund of knowledge intact.  Speech fluent and not dysarthric, language intact.  Nystagmus noted on tracking in all directions.  Otherwise, CN II-XII intact. Bulk and tone normal, muscle strength 5/5 throughout.  Sensation to light touch intact.  Deep tendon reflexes 2+ throughout.  Finger to nose with mild tremor and dysmetria.  Gait fairly steady. Romberg with sway.  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  Continue phenobarbital 97.5mg  at bedtime.  Advised to restart calcium 500mg  twice daily and vitamin D 600 IU twice daily.  Will check hepatic panel and repeat DEXA scan. 2.  Continue Cymbalta 30mg  daily for headache prevention 3.  Follow up in one year or as needed.  28 minutes spent face to face with patient, over 50% spent discussing how she is doing and mostly about her husband's current health.  Metta Clines, DO  CC:  Scarlette Calico, MD

## 2016-10-16 ENCOUNTER — Other Ambulatory Visit: Payer: Self-pay | Admitting: Internal Medicine

## 2016-10-17 DIAGNOSIS — S62307D Unspecified fracture of fifth metacarpal bone, left hand, subsequent encounter for fracture with routine healing: Secondary | ICD-10-CM | POA: Diagnosis not present

## 2016-10-19 ENCOUNTER — Telehealth: Payer: Self-pay | Admitting: Internal Medicine

## 2016-10-20 ENCOUNTER — Telehealth: Payer: Self-pay | Admitting: Internal Medicine

## 2016-10-20 NOTE — Telephone Encounter (Signed)
Patient called and stated that the pharmacy did not get the request for this medication. Can you please send it [facility] to the [facility]. Thank you.

## 2016-10-20 NOTE — Telephone Encounter (Signed)
Pharmacy called and they did not receive PHENobarbital (LUMINAL) 97.2 MG tablet    Please resend  [facility] [address] [facility] [place]

## 2016-10-20 NOTE — Telephone Encounter (Signed)
[facility] to [facility] as requested.

## 2016-10-27 ENCOUNTER — Other Ambulatory Visit: Payer: Self-pay | Admitting: Internal Medicine

## 2016-11-13 DIAGNOSIS — Z9884 Bariatric surgery status: Secondary | ICD-10-CM | POA: Diagnosis not present

## 2016-11-18 ENCOUNTER — Ambulatory Visit (INDEPENDENT_AMBULATORY_CARE_PROVIDER_SITE_OTHER): Payer: PPO | Admitting: Internal Medicine

## 2016-11-18 ENCOUNTER — Encounter: Payer: Self-pay | Admitting: Internal Medicine

## 2016-11-18 ENCOUNTER — Other Ambulatory Visit (INDEPENDENT_AMBULATORY_CARE_PROVIDER_SITE_OTHER): Payer: PPO

## 2016-11-18 VITALS — BP 118/84 | HR 85 | Temp 98.1°F | Resp 16 | Ht 63.0 in | Wt 162.0 lb

## 2016-11-18 DIAGNOSIS — E785 Hyperlipidemia, unspecified: Secondary | ICD-10-CM

## 2016-11-18 DIAGNOSIS — I10 Essential (primary) hypertension: Secondary | ICD-10-CM

## 2016-11-18 DIAGNOSIS — Z1231 Encounter for screening mammogram for malignant neoplasm of breast: Secondary | ICD-10-CM | POA: Insufficient documentation

## 2016-11-18 DIAGNOSIS — R7303 Prediabetes: Secondary | ICD-10-CM

## 2016-11-18 DIAGNOSIS — Z0000 Encounter for general adult medical examination without abnormal findings: Secondary | ICD-10-CM

## 2016-11-18 LAB — COMPREHENSIVE METABOLIC PANEL
ALT: 15 U/L (ref 0–35)
AST: 19 U/L (ref 0–37)
Albumin: 4.4 g/dL (ref 3.5–5.2)
Alkaline Phosphatase: 87 U/L (ref 39–117)
BUN: 17 mg/dL (ref 6–23)
CO2: 24 meq/L (ref 19–32)
CREATININE: 0.97 mg/dL (ref 0.40–1.20)
Calcium: 9.8 mg/dL (ref 8.4–10.5)
Chloride: 103 mEq/L (ref 96–112)
GFR: 61.29 mL/min (ref >60.00)
Glucose, Bld: 108 mg/dL — ABNORMAL HIGH (ref 70–99)
Potassium: 4.2 mEq/L (ref 3.5–5.1)
SODIUM: 137 meq/L (ref 135–145)
Total Bilirubin: 0.4 mg/dL (ref 0.2–1.2)
Total Protein: 7.6 g/dL (ref 6.0–8.3)

## 2016-11-18 LAB — URINALYSIS, ROUTINE W REFLEX MICROSCOPIC
Hgb urine dipstick: NEGATIVE
Leukocytes, UA: NEGATIVE
Nitrite: NEGATIVE
UROBILINOGEN UA: 0.2 (ref 0.0–1.0)
Urine Glucose: NEGATIVE
pH: 5.5 (ref 5.0–8.0)

## 2016-11-18 LAB — LIPID PANEL
CHOL/HDL RATIO: 4
Cholesterol: 172 mg/dL (ref 0–200)
HDL: 40.4 mg/dL (ref >39.00)
LDL Cholesterol: 102 mg/dL — ABNORMAL HIGH (ref 0–99)
NONHDL: 131.11
Triglycerides: 146 mg/dL (ref 0.0–149.0)
VLDL: 29.2 mg/dL (ref 0.0–40.0)

## 2016-11-18 LAB — HEMOGLOBIN A1C: HEMOGLOBIN A1C: 6.2 % (ref 4.6–6.5)

## 2016-11-18 NOTE — Progress Notes (Signed)
Pre visit review using our clinic review tool, if applicable. No additional management support is needed unless otherwise documented below in the visit note. 

## 2016-11-18 NOTE — Progress Notes (Signed)
Subjective:  Patient ID: Brandi Ramsey, female    DOB: [birth year]  Age: 66 y.o. MRN: [id number]  CC: Hypertension; Hyperlipidemia; and Medicare Wellness   HPI Brandi Ramsey presents for f/up - Since I last saw her she has undergone bariatric surgery and is doing remarkably well. She feels well and has lost a significant amount of weight. Her blood sugars and blood pressure have been well controlled with no medications. She feels well today and offers no complaints.  Outpatient Medications Prior to Visit  Medication Sig Dispense Refill  . aspirin EC 81 MG EC tablet Take 1 tablet (81 mg total) by mouth daily.    [name] atorvastatin (LIPITOR) 80 MG tablet TAKE 1 TABLET BY MOUTH DAILY AT 6 PM. 90 tablet 1  . Calcium-Vitamin D 600-200 MG-UNIT per tablet Take 1 tablet by mouth 2 (two) times daily.    . clonazePAM (KLONOPIN) 1 MG tablet TAKE 1 TABLET TWICE A DAY AS NEEDED FOR ANXIETY 45 tablet 3  . DULoxetine (CYMBALTA) 30 MG capsule Take 1 capsule (30 mg total) by mouth daily. 90 capsule 1  . PHENobarbital (LUMINAL) 97.2 MG tablet TAKE 1 TABLET AT BEDTIME 30 tablet 3  . ranitidine (ZANTAC) 150 MG tablet Take 150 mg by mouth 2 (two) times daily as needed (for indigestion).     . Vitamin D, Ergocalciferol, (DRISDOL) 50000 UNITS CAPS capsule Take 50,000 Units by mouth once a week. Reported on 08/24/2015    . BREO ELLIPTA 100-25 MCG/INH AEPB Inhale 1 puff into the lungs daily.   11  . pantoprazole (PROTONIX) 40 MG tablet TAKE 1 TABLET TWICE A DAY 60 tablet 11  . polyethylene glycol (MIRALAX / GLYCOLAX) packet Take 17 g by mouth daily.     No facility-administered medications prior to visit.     ROS Review of Systems  Constitutional: Negative.  Negative for diaphoresis, fatigue and fever.  HENT: Negative.  Negative for trouble swallowing.   Eyes: Negative for visual disturbance.  Respiratory: Negative for cough, chest tightness, shortness of breath and wheezing.   Cardiovascular: Negative for chest  pain, palpitations and leg swelling.  Gastrointestinal: Negative for abdominal pain, constipation, diarrhea, nausea and vomiting.  Endocrine: Negative.   Genitourinary: Negative.  Negative for difficulty urinating.  Musculoskeletal: Negative for arthralgias, back pain and myalgias.  Skin: Negative.  Negative for color change and rash.  Allergic/Immunologic: Negative.   Neurological: Negative.  Negative for dizziness, weakness and light-headedness.  Hematological: Negative for adenopathy. Does not bruise/bleed easily.  Psychiatric/Behavioral: Negative.  Negative for dysphoric mood and sleep disturbance. The patient is not nervous/anxious.     Objective:  BP 118/84 (BP Location: Left Arm, Patient Position: Sitting, Cuff Size: Normal)   Pulse 85   Temp 98.1 F (36.7 C) (Oral)   Resp 16   Ht 5\' 3"  (1.6 m)   Wt 162 lb (73.5 kg)   SpO2 99%   BMI 28.70 kg/m   BP Readings from Last 3 Encounters:  11/18/16 118/84  10/15/16 108/68  06/24/16 92/60    Wt Readings from Last 3 Encounters:  11/18/16 162 lb (73.5 kg)  10/15/16 170 lb (77.1 kg)  06/24/16 197 lb 6.4 oz (89.5 kg)    Physical Exam  Constitutional: She is oriented to person, place, and time. No distress.  HENT:  Mouth/Throat: Oropharynx is clear and moist. No oropharyngeal exudate.  Eyes: Conjunctivae are normal. Right eye exhibits no discharge. Left eye exhibits no discharge. No scleral icterus.  Neck: Normal  range of motion. Neck supple. No JVD present. No tracheal deviation present. No thyromegaly present.  Cardiovascular: Normal rate, regular rhythm, normal heart sounds and intact distal pulses.  Exam reveals no gallop and no friction rub.   No murmur heard. Pulmonary/Chest: Effort normal and breath sounds normal. No stridor. No respiratory distress. She has no wheezes. She has no rales. She exhibits no tenderness.  Abdominal: Soft. Bowel sounds are normal. She exhibits no distension and no mass. There is no tenderness.  There is no rebound and no guarding.  Musculoskeletal: Normal range of motion. She exhibits no edema, tenderness or deformity.  Lymphadenopathy:    She has no cervical adenopathy.  Neurological: She is oriented to person, place, and time.  Skin: Skin is warm and dry. No rash noted. She is not diaphoretic. No erythema. No pallor.  Psychiatric: She has a normal mood and affect. Her behavior is normal. Judgment and thought content normal.  Vitals reviewed.   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 Dizziness. Posterior head and neck pain. Subsequent encounter. Creatinine was obtained on site at [facility] at [address]Results: Creatinine 1.0 mg/dL. EXAM: MRI HEAD WITHOUT AND WITH CONTRAST MRI CERVICAL SPINE WITHOUT AND WITH CONTRAST TECHNIQUE: Multiplanar, multiecho pulse sequences of the brain and surrounding structures, and cervical spine, to include the craniocervical junction and cervicothoracic junction, were obtained without and with intravenous contrast. CONTRAST:  39mL MULTIHANCE GADOBENATE DIMEGLUMINE 529 MG/ML IV SOLN COMPARISON:  01/29/2015 CT.  08/27/2013 in 09/2009 2012 brain MR FINDINGS: MRI HEAD FINDINGS Some of the sequences are motion degraded. No acute infarct. No intracranial  hemorrhage. Slight progression of moderate nonspecific white matter type changes most notable periventricular region. In a diabetic hypertensive patient of this age, findings may reflect result of small vessel disease. With history multiple sclerosis, demyelinating process is also consideration although appearance is not specific for such. None of these areas demonstrate enhancement or restricted motion as can be seen with areas of active demyelination. None of these areas demonstrate mass effect as can be seen with progressive multifocal leukoencephalopathy. No intracranial mass or abnormal enhancement. Hyperostosis frontalis interna incidentally noted. Mild global atrophy most notable frontal -temporal lobes without hydrocephalus. Major intracranial vascular structures are patent. Cervical medullary junction, pituitary region, pineal region and orbital structures unremarkable. Post lens replacement. No evidence of mesial temporal sclerosis. MRI CERVICAL SPINE FINDINGS Some of the sequences are motion degraded. Cervical medullary junction unremarkable. Mild motion artifact extends through the cervical cord without definitive focal cord signal abnormality. No abnormal enhancement. Mild kyphosis cervical spine centered at C5 level. Left C4 facet slightly anteriorly located. This appears at most minimally different from the 08/27/2013 examination. If there were  any suspicion of instability, flexion/ extension views can be obtained for further delineation. There is no evidence of bony edema soft tissue edema to suggest acute osseous or soft tissue injury. C2-3:  Negative. C3-4: Shallow disc osteophyte greater right paracentral position. Slight narrowing ventral aspect of the thecal sac without cord compression. C4-5: Shallow disc osteophyte complex slightly greater to right. Mild narrowing ventral aspect of the thecal sac. C5-6: Broad-based disc osteophyte complex greatest extension left paracentral position. Narrowing  ventral aspect of the thecal sac greater on left with mild left-sided cord flattening. Left uncinate hypertrophy. Moderate left foraminal narrowing. C6-7: Broad-based disc osteophyte complex slightly greater to left. Narrowing ventral aspect of the thecal sac without cord compression. Mild foraminal narrowing greater on the left. C7-T1: Mild posterior element hypertrophy greater on the left. Slight impression dorsal aspect of the thecal sac greater on the left. IMPRESSION: MRI HEAD FINDINGS Some of the sequences are motion degraded. Slight progression of moderate nonspecific white matter type changes most notable periventricular region. In a diabetic hypertensive patient of this age, findings may reflect result of small vessel disease. With history multiple sclerosis, demyelinating process is also consideration although appearance is not specific for such. No acute infarct or intracranial hemorrhage. No intracranial mass or abnormal enhancement. Mild global atrophy. MRI CERVICAL SPINE Some of the sequences are motion degraded. Mild motion artifact extends through the cervical cord without definitive focal cord signal abnormality. No abnormal enhancement. Mild kyphosis cervical spine centered at C5 level. Left C4 facet slightly anteriorly located. This appears at most minimally different from the 08/27/2013 examination. If there were any suspicion of instability, flexion/ extension views can be obtained for further delineation. There is no evidence of bony edema soft tissue edema to suggest acute osseous or soft tissue injury. Cervical spondylotic changes most notable C5-6 level. Summary of pertinent findings includes: C3-4 shallow disc osteophyte greater right paracentral position. Slight narrowing ventral aspect of the thecal sac without cord compression. C4-5 shallow disc osteophyte complex slightly greater to right. Mild narrowing ventral aspect of the thecal sac. C5-6 broad-based disc osteophyte complex greatest  extension left paracentral position. Narrowing ventral aspect of the thecal sac greater on left with mild left-sided cord flattening. Left uncinate hypertrophy. Moderate left foraminal narrowing. C6-7 broad-based disc osteophyte complex slightly greater to left. Narrowing ventral aspect of the thecal sac without cord compression. Mild foraminal narrowing greater on the left. C7-T1 mild posterior element hypertrophy greater on the left. Slight impression dorsal aspect of the thecal sac greater on the left. Electronically Signed   By: Genia Del M.D.   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 Dizziness. Posterior head and neck pain. Subsequent encounter. Creatinine was obtained on site at [facility] at [address]Results: Creatinine 1.0 mg/dL. EXAM: MRI HEAD WITHOUT AND WITH CONTRAST MRI CERVICAL SPINE WITHOUT AND WITH CONTRAST TECHNIQUE: Multiplanar, multiecho pulse sequences of the brain and surrounding structures, and cervical spine, to include the craniocervical junction and cervicothoracic junction, were obtained without and with intravenous contrast. CONTRAST:  78mL MULTIHANCE GADOBENATE DIMEGLUMINE 529 MG/ML IV SOLN COMPARISON:  01/29/2015 CT.  08/27/2013 in 09/2009 2012 brain MR FINDINGS: MRI HEAD FINDINGS Some of the sequences are motion degraded. No acute infarct. No intracranial hemorrhage. Slight progression of moderate nonspecific white matter type changes most notable  periventricular region. In a diabetic hypertensive patient of this age, findings may reflect result of small vessel disease. With history multiple sclerosis, demyelinating process is also consideration although appearance is not specific for such. None of these areas demonstrate enhancement or  restricted motion as can be seen with areas of active demyelination. None of these areas demonstrate mass effect as can be seen with progressive multifocal leukoencephalopathy. No intracranial mass or abnormal enhancement. Hyperostosis frontalis interna incidentally noted. Mild global atrophy most notable frontal -temporal lobes without hydrocephalus. Major intracranial vascular structures are patent. Cervical medullary junction, pituitary region, pineal region and orbital structures unremarkable. Post lens replacement. No evidence of mesial temporal sclerosis. MRI CERVICAL SPINE FINDINGS Some of the sequences are motion degraded. Cervical medullary junction unremarkable. Mild motion artifact extends through the cervical cord without definitive focal cord signal abnormality. No abnormal enhancement. Mild kyphosis cervical spine centered at C5 level. Left C4 facet slightly anteriorly located. This appears at most minimally different from the 08/27/2013 examination. If there were any suspicion of instability, flexion/ extension views can be obtained for further delineation. There is no evidence of bony edema soft tissue edema to suggest acute osseous or soft tissue injury. C2-3:  Negative. C3-4: Shallow disc osteophyte greater right paracentral position. Slight narrowing ventral aspect of the thecal sac without cord compression. C4-5: Shallow disc osteophyte complex slightly greater to right. Mild narrowing ventral aspect of the thecal sac. C5-6: Broad-based disc osteophyte complex greatest extension left paracentral position. Narrowing ventral aspect of the thecal sac greater on left with mild left-sided cord flattening. Left uncinate hypertrophy. Moderate left foraminal narrowing. C6-7: Broad-based disc osteophyte complex slightly greater to left. Narrowing ventral aspect of the thecal sac without cord compression. Mild foraminal narrowing greater on the left. C7-T1: Mild posterior element hypertrophy greater on the  left. Slight impression dorsal aspect of the thecal sac greater on the left. IMPRESSION: MRI HEAD FINDINGS Some of the sequences are motion degraded. Slight progression of moderate nonspecific white matter type changes most notable periventricular region. In a diabetic hypertensive patient of this age, findings may reflect result of small vessel disease. With history multiple sclerosis, demyelinating process is also consideration although appearance is not specific for such. No acute infarct or intracranial hemorrhage. No intracranial mass or abnormal enhancement. Mild global atrophy. MRI CERVICAL SPINE Some of the sequences are motion degraded. Mild motion artifact extends through the cervical cord without definitive focal cord signal abnormality. No abnormal enhancement. Mild kyphosis cervical spine centered at C5 level. Left C4 facet slightly anteriorly located. This appears at most minimally different from the 08/27/2013 examination. If there were any suspicion of instability, flexion/ extension views can be obtained for further delineation. There is no evidence of bony edema soft tissue edema to suggest acute osseous or soft tissue injury. Cervical spondylotic changes most notable C5-6 level. Summary of pertinent findings includes: C3-4 shallow disc osteophyte greater right paracentral position. Slight narrowing ventral aspect of the thecal sac without cord compression. C4-5 shallow disc osteophyte complex slightly greater to right. Mild narrowing ventral aspect of the thecal sac. C5-6 broad-based disc osteophyte complex greatest extension left paracentral position. Narrowing ventral aspect of the thecal sac greater on left with mild left-sided cord flattening. Left uncinate hypertrophy. Moderate left foraminal narrowing. C6-7 broad-based disc osteophyte complex slightly greater to left. Narrowing ventral aspect of the thecal sac without cord compression. Mild foraminal narrowing greater on the left. C7-T1 mild  posterior element hypertrophy greater on the left. Slight impression dorsal aspect  of the thecal sac greater on the left. Electronically Signed   By: Genia Del M.D.   On: 03/16/2015 00:05    Assessment & Plan:   Aracely was seen today for hypertension, hyperlipidemia and medicare wellness.  Diagnoses and all orders for this visit:  Essential (primary) hypertension- her blood pressure is normal now, medications are not needed to treat this. -     Comprehensive metabolic panel; Future -     Urinalysis, Routine w reflex microscopic; Future  Hyperlipidemia with target LDL less than 100- she has achieved her LDL goal is doing well on the statin. -     Lipid panel; Future  Prediabetes- her A1c is 6.2% on no medications, she is prediabetic and agrees to continue to work on her lifestyle modifications. She was praised for the weight loss. -     Comprehensive metabolic panel; Future -     Hemoglobin A1c; Future  Visit for screening mammogram -     MM DIGITAL SCREENING BILATERAL; Future  Encounter for Medicare annual wellness exam   I have discontinued Ms. Linhares's BREO ELLIPTA, polyethylene glycol, and pantoprazole. I am also having her maintain her Vitamin D (Ergocalciferol), Calcium-Vitamin D, ranitidine, aspirin, clonazePAM, DULoxetine, PHENobarbital, and atorvastatin.  No orders of the defined types were placed in this encounter.    Follow-up: Return in about 6 months (around 05/21/2017).  Scarlette Calico, MD

## 2016-11-18 NOTE — Progress Notes (Addendum)
Subjective:   Brandi Ramsey is a 66 y.o. female who presents for an Initial Medicare Annual Wellness Visit.  Review of Systems    No ROS.  Medicare Wellness Visit.   Cardiac Risk Factors include: advanced age (>30men, >61 women);diabetes mellitus;dyslipidemia;hypertension Sleep patterns: feels rested on waking, gets up 2 times nightly to void and sleeps 6-7 hours nightly.   Home Safety/Smoke Alarms: Feels safe in home. Smoke alarms in place.    Living environment; residence and Firearm Safety: 2-story house, no firearms.Lives with husband, no DME needed Seat Belt Safety/Bike Helmet: Wears seat belt.   Counseling:   Eye Exam- appointment yearly Dental- appointment yearly   Female:   Pap-  N/A     Mammo-  PCP placed referral today    Dexa scan- 10/31/14,  Normal      CCS- Last 08/24/15, precancerous polyps, recall 3 years     Objective:    Today's Vitals   11/18/16 0940  BP: 118/84  Pulse: 85  Resp: 16  Temp: 98.1 F (36.7 C)  TempSrc: Oral  SpO2: 99%  Weight: 162 lb (73.5 kg)  Height: 5\' 3"  (1.6 m)   Body mass index is 28.7 kg/m.   Current Medications (verified) Outpatient Encounter Prescriptions as of 11/18/2016  Medication Sig  . aspirin EC 81 MG EC tablet Take 1 tablet (81 mg total) by mouth daily.  [name] atorvastatin (LIPITOR) 80 MG tablet TAKE 1 TABLET BY MOUTH DAILY AT 6 PM.  . Calcium-Vitamin D 600-200 MG-UNIT per tablet Take 1 tablet by mouth 2 (two) times daily.  . clonazePAM (KLONOPIN) 1 MG tablet TAKE 1 TABLET TWICE A DAY AS NEEDED FOR ANXIETY  . DULoxetine (CYMBALTA) 30 MG capsule Take 1 capsule (30 mg total) by mouth daily.  [name] PHENobarbital (LUMINAL) 97.2 MG tablet TAKE 1 TABLET AT BEDTIME  . ranitidine (ZANTAC) 150 MG tablet Take 150 mg by mouth 2 (two) times daily as needed (for indigestion).   . Vitamin D, Ergocalciferol, (DRISDOL) 50000 UNITS CAPS capsule Take 50,000 Units by mouth once a week. Reported on 08/24/2015  . [DISCONTINUED] BREO ELLIPTA  100-25 MCG/INH AEPB Inhale 1 puff into the lungs daily.   . [DISCONTINUED] pantoprazole (PROTONIX) 40 MG tablet TAKE 1 TABLET TWICE A DAY  . [DISCONTINUED] polyethylene glycol (MIRALAX / GLYCOLAX) packet Take 17 g by mouth daily.   No facility-administered encounter medications on file as of 11/18/2016.     Allergies (verified) Buprenorphine hcl; Hydromorphone; Meperidine; Metformin and related; Morphine and related; Pentazocine; Pentazocine lactate; Phenytoin; Demerol; Dilaudid [hydromorphone hcl]; Doxycycline hyclate; Ibuprofen; Phenytoin sodium extended; Codeine; and Doxycycline hyclate   History: Past Medical History:  Diagnosis Date  . Anxiety   . Arthritis   . Cerebellar ataxia ([place])   . Colon polyps   . Depression   . Diabetes mellitus without complication ([place])   . Fibromyalgia   . Fragile X syndrome    ataxia syndrome.    . Gastric ulcer   . Hemorrhoids   . History of Doppler ultrasound    a. Carotid US 3/15: mild soft plaque origin ICA. 1-39% ICA stenosis.  [name] History of echocardiogram    a. Echo 3/15: Moderate LVH, focal basal hypertrophy, EF 60-65%, normal wall motion  . History of nuclear stress test    a. Myoview 5/16: Normal perfusion, EF 78%, low risk  . Hyperlipidemia   . Hypertension   . MS (multiple sclerosis) ([place])   . Obesity   . Pneumonia   .  Seizures ([place])   . Stroke ([place])   . Transient cerebral ischemia   . Vertigo, benign paroxysmal    Past Surgical History:  Procedure Laterality Date  . ABDOMINAL HYSTERECTOMY    . FRACTURE SURGERY     tib/fib (R) leg  . LAPAROSCOPIC GASTRIC SLEEVE RESECTION  05/27/2016   Family History  Problem Relation Age of Onset  . Raynaud syndrome Mother   . Colon polyps Mother   . Heart attack Mother   . Alzheimer's disease Father   . Diabetes Father   . Colon polyps Father   . Stroke Unknown   . Fragile X syndrome Son        Patient is carrier  . Diabetes Sister   . Diabetes Brother   . Cervical cancer  Maternal Grandmother   . Breast cancer Maternal Grandmother   . Heart failure Maternal Grandfather   . Diabetes Paternal Grandmother   . Colon polyps Sister   . Colon polyps Brother   . Colon cancer Maternal Aunt    Social History   Occupational History  . Not on file.   Social History Main Topics  . Smoking status: Never Smoker  . Smokeless tobacco: Never Used  . Alcohol use No  . Drug use: No  . Sexual activity: Yes    Partners: Male    Tobacco Counseling Counseling given: Not Answered   Activities of Daily Living In your present state of health, do you have any difficulty performing the following activities: 11/18/2016  Hearing? N  Vision? N  Difficulty concentrating or making decisions? N  Walking or climbing stairs? N  Dressing or bathing? N  Doing errands, shopping? N  Preparing Food and eating ? N  Using the Toilet? N  In the past six months, have you accidently leaked urine? N  Do you have problems with loss of bowel control? N  Managing your Medications? N  Managing your Finances? N  Housekeeping or managing your Housekeeping? N  Some recent data might be hidden    Immunizations and Health Maintenance Immunization History  Administered Date(s) Administered  . Influenza,inj,Quad PF,36+ Mos 02/21/2016  . Influenza-Unspecified 03/20/2014, 04/03/2015  . Pneumococcal-Unspecified 03/28/2013  . Tdap 09/05/2015  . Zoster 06/23/2012   Health Maintenance Due  Topic Date Due  . Hepatitis C Screening  07/01/1950  . HIV Screening  01/12/1967  . PAP SMEAR  01/11/1973  . MAMMOGRAM  01/11/2002  . OPHTHALMOLOGY EXAM  09/03/2016  . FOOT EXAM  09/04/2016  . URINE MICROALBUMIN  09/04/2016    Patient Care Team: Janith Lima, MD as PCP - General (Internal Medicine)  Indicate any recent Medical Services you may have received from other than [facility] providers in the past year (date may be approximate).     Assessment:   This is a routine wellness examination for  [place].Physical assessment deferred to PCP.   Hearing/Vision screen Hearing Screening Comments: HOH, right ear hearing loss. Seen by audiology in the past. Vision Screening Comments: S/P cataract surgery, reading glasses  Dietary issues and exercise activities discussed: Current Exercise Habits: Structured exercise class;Home exercise routine, Type of exercise: walking;calisthenics;strength training/weights, Time (Minutes): 35, Frequency (Times/Week): 7, Weekly Exercise (Minutes/Week): 245, Intensity: Moderate, Exercise limited by: None identified Diet (meal preparation, eat out, water intake, caffeinated beverages, dairy products, fruits and vegetables): in general, a "healthy" diet  , well balanced, diabetic, low fat/ cholesterol, low salt eats a variety of fruits and vegetables daily, limits salt, fat/cholesterol, sugar, caffeine, drinks 6-8  glasses of water daily.  Patient s/p bariatric surgery, with weight loss of 70 pounds     Goals    . Enjoy life, be healthy, and stay on top of my health.          Be there for my family and travel      Depression Screen PHQ 2/9 Scores 11/18/2016  PHQ - 2 Score 2  PHQ- 9 Score 4    Fall Risk Fall Risk  11/18/2016 08/14/2015 04/06/2015  Falls in the past year? Yes Yes Yes  Number falls in past yr: 1 1 2  or more  Injury with Fall? Yes Yes Yes  Risk Factor Category  - - High Fall Risk  Risk for fall due to : - - Impaired balance/gait  Follow up Falls prevention discussed;Education provided Falls evaluation completed Falls evaluation completed;Falls prevention discussed    Cognitive Function: MMSE - Mini Mental State Exam 10/10/2014  Orientation to time 5  Orientation to Place 5  Registration 3  Attention/ Calculation 5  Recall 3  Language- name 2 objects 2  Language- repeat 1  Language- follow 3 step command 3  Language- read & follow direction 1  Write a sentence 1  Copy design 1  Total score 30       Ad8 score reviewed for  issues:  Issues making decisions: no  Less interest in hobbies / activities: no  Repeats questions, stories (family complaining): no  Trouble using ordinary gadgets (microwave, computer, phone): no  Forgets the month or year: no  Mismanaging finances: no  Remembering appts: no  Daily problems with thinking and/or memory: no Ad8 score is= 0     Screening Tests Health Maintenance  Topic Date Due  . Hepatitis C Screening  Dec 26, 1950  . HIV Screening  01/12/1967  . PAP SMEAR  01/11/1973  . MAMMOGRAM  01/11/2002  . OPHTHALMOLOGY EXAM  09/03/2016  . FOOT EXAM  09/04/2016  . URINE MICROALBUMIN  09/04/2016  . HEMOGLOBIN A1C  12/22/2016  . INFLUENZA VACCINE  01/21/2017  . COLONOSCOPY  09/10/2018  . TETANUS/TDAP  09/04/2025      Plan:    Continue to eat heart healthy diet (full of fruits, vegetables, whole grains, lean protein, water--limit salt, fat, and sugar intake) and increase physical activity as tolerated.  Continue doing brain stimulating activities (puzzles, reading, adult coloring books, staying active) to keep memory sharp.    I have personally reviewed and noted the following in the patient's chart:   . Medical and social history . Use of alcohol, tobacco or illicit drugs  . Current medications and supplements . Functional ability and status . Nutritional status . Physical activity . Advanced directives . List of other physiciansVitals . Screenings to include cognitive, depression, and falls . Referrals and appointments  In addition, I have reviewed and discussed with patient certain preventive protocols, quality metrics, and best practice recommendations. A written personalized care plan for preventive services as well as general preventive health recommendations were provided to patient.     Michiel Cowboy, RN   11/18/2016   Medical screening examination/treatment/procedure(s) were performed by non-physician practitioner and as supervising physician I  was immediately available for consultation/collaboration. I agree with above. Scarlette Calico, MD

## 2016-11-18 NOTE — Patient Instructions (Addendum)
Brandi Ramsey , Thank you for taking time to come for your Medicare Wellness Visit. I appreciate your ongoing commitment to your health goals. Please review the following plan we discussed and let me know if I can assist you in the future.   These are the goals we discussed: Goals    . Enjoy life, be healthy, and stay on top of my health.          Be there for my family and travel       This is a list of the screening recommended for you and due dates:  Health Maintenance  Topic Date Due  .  Hepatitis C: One time screening is recommended by Center for Disease Control  (CDC) for  adults born from [birth year] through 1964.   Jul 16, 1950  . HIV Screening  01/12/1967  . Pap Smear  01/11/1973  . Mammogram  01/11/2002  . Eye exam for diabetics  09/03/2016  . Complete foot exam   09/04/2016  . Urine Protein Check  09/04/2016  . Hemoglobin A1C  12/22/2016  . Flu Shot  01/21/2017  . Colon Cancer Screening  09/10/2018  . Tetanus Vaccine  09/04/2025    Hypertension Hypertension, commonly called high blood pressure, is when the force of blood pumping through the arteries is too strong. The arteries are the blood vessels that carry blood from the heart throughout the body. Hypertension forces the heart to work harder to pump blood and may cause arteries to become narrow or stiff. Having untreated or uncontrolled hypertension can cause heart attacks, strokes, kidney disease, and other problems. A blood pressure reading consists of a higher number over a lower number. Ideally, your blood pressure should be below 120/80. The first ("top") number is called the systolic pressure. It is a measure of the pressure in your arteries as your heart beats. The second ("bottom") number is called the diastolic pressure. It is a measure of the pressure in your arteries as the heart relaxes. What are the causes? The cause of this condition is not known. What increases the risk? Some risk factors for high blood pressure are  under your control. Others are not. Factors you can change   Smoking.  Having type 2 diabetes mellitus, high cholesterol, or both.  Not getting enough exercise or physical activity.  Being overweight.  Having too much fat, sugar, calories, or salt (sodium) in your diet.  Drinking too much alcohol. Factors that are difficult or impossible to change   Having chronic kidney disease.  Having a family history of high blood pressure.  Age. Risk increases with age.  Race. You may be at higher risk if you are African-American.  Gender. Men are at higher risk than women before age 31. After age 44, women are at higher risk than men.  Having obstructive sleep apnea.  Stress. What are the signs or symptoms? Extremely high blood pressure (hypertensive crisis) may cause:  Headache.  Anxiety.  Shortness of breath.  Nosebleed.  Nausea and vomiting.  Severe chest pain.  Jerky movements you cannot control (seizures). How is this diagnosed? This condition is diagnosed by measuring your blood pressure while you are seated, with your arm resting on a surface. The cuff of the blood pressure monitor will be placed directly against the skin of your upper arm at the level of your heart. It should be measured at least twice using the same arm. Certain conditions can cause a difference in blood pressure between your right and left  arms. Certain factors can cause blood pressure readings to be lower or higher than normal (elevated) for a short period of time:  When your blood pressure is higher when you are in a health care provider's office than when you are at home, this is called white coat hypertension. Most people with this condition do not need medicines.  When your blood pressure is higher at home than when you are in a health care provider's office, this is called masked hypertension. Most people with this condition may need medicines to control blood pressure. If you have a high  blood pressure reading during one visit or you have normal blood pressure with other risk factors:  You may be asked to return on a different day to have your blood pressure checked again.  You may be asked to monitor your blood pressure at home for 1 week or longer. If you are diagnosed with hypertension, you may have other blood or imaging tests to help your health care provider understand your overall risk for other conditions. How is this treated? This condition is treated by making healthy lifestyle changes, such as eating healthy foods, exercising more, and reducing your alcohol intake. Your health care provider may prescribe medicine if lifestyle changes are not enough to get your blood pressure under control, and if:  Your systolic blood pressure is above 130.  Your diastolic blood pressure is above 80. Your personal target blood pressure may vary depending on your medical conditions, your age, and other factors. Follow these instructions at home: Eating and drinking   Eat a diet that is high in fiber and potassium, and low in sodium, added sugar, and fat. An example eating plan is called the DASH (Dietary Approaches to Stop Hypertension) diet. To eat this way:  Eat plenty of fresh fruits and vegetables. Try to fill half of your plate at each meal with fruits and vegetables.  Eat whole grains, such as whole wheat pasta, brown rice, or whole grain bread. Fill about one quarter of your plate with whole grains.  Eat or drink low-fat dairy products, such as skim milk or low-fat yogurt.  Avoid fatty cuts of meat, processed or cured meats, and poultry with skin. Fill about one quarter of your plate with lean proteins, such as fish, chicken without skin, beans, eggs, and tofu.  Avoid premade and processed foods. These tend to be higher in sodium, added sugar, and fat.  Reduce your daily sodium intake. Most people with hypertension should eat less than 1,500 mg of sodium a day.  Limit  alcohol intake to no more than 1 drink a day for nonpregnant women and 2 drinks a day for men. One drink equals 12 oz of beer, 5 oz of wine, or 1 oz of hard liquor. Lifestyle   Work with your health care provider to maintain a healthy body weight or to lose weight. Ask what an ideal weight is for you.  Get at least 30 minutes of exercise that causes your heart to beat faster (aerobic exercise) most days of the week. Activities may include walking, swimming, or biking.  Include exercise to strengthen your muscles (resistance exercise), such as pilates or lifting weights, as part of your weekly exercise routine. Try to do these types of exercises for 30 minutes at least 3 days a week.  Do not use any products that contain nicotine or tobacco, such as cigarettes and e-cigarettes. If you need help quitting, ask your health care provider.  Monitor your  blood pressure at home as told by your health care provider.  Keep all follow-up visits as told by your health care provider. This is important. Medicines   Take over-the-counter and prescription medicines only as told by your health care provider. Follow directions carefully. Blood pressure medicines must be taken as prescribed.  Do not skip doses of blood pressure medicine. Doing this puts you at risk for problems and can make the medicine less effective.  Ask your health care provider about side effects or reactions to medicines that you should watch for. Contact a health care provider if:  You think you are having a reaction to a medicine you are taking.  You have headaches that keep coming back (recurring).  You feel dizzy.  You have swelling in your ankles.  You have trouble with your vision. Get help right away if:  You develop a severe headache or confusion.  You have unusual weakness or numbness.  You feel faint.  You have severe pain in your chest or abdomen.  You vomit repeatedly.  You have trouble  breathing. Summary  Hypertension is when the force of blood pumping through your arteries is too strong. If this condition is not controlled, it may put you at risk for serious complications.  Your personal target blood pressure may vary depending on your medical conditions, your age, and other factors. For most people, a normal blood pressure is less than 120/80.  Hypertension is treated with lifestyle changes, medicines, or a combination of both. Lifestyle changes include weight loss, eating a healthy, low-sodium diet, exercising more, and limiting alcohol. This information is not intended to replace advice given to you by your health care provider. Make sure you discuss any questions you have with your health care provider. Document Released: 06/09/2005 Document Revised: 05/07/2016 Document Reviewed: 05/07/2016 Elsevier Interactive Patient Education  2017 [employer].

## 2016-11-19 DIAGNOSIS — Z9884 Bariatric surgery status: Secondary | ICD-10-CM | POA: Diagnosis not present

## 2016-11-19 DIAGNOSIS — E669 Obesity, unspecified: Secondary | ICD-10-CM | POA: Diagnosis not present

## 2016-11-19 NOTE — Telephone Encounter (Signed)
Patient called about labs. She was informed of notes. She understood.

## 2016-11-26 ENCOUNTER — Other Ambulatory Visit: Payer: Self-pay | Admitting: Internal Medicine

## 2016-11-26 DIAGNOSIS — F411 Generalized anxiety disorder: Secondary | ICD-10-CM

## 2016-11-27 NOTE — Telephone Encounter (Signed)
rx faxed to pharmacy on file.  

## 2017-01-09 ENCOUNTER — Telehealth: Payer: Self-pay | Admitting: Internal Medicine

## 2017-01-09 NOTE — Telephone Encounter (Signed)
Ok with me 

## 2017-01-09 NOTE — Telephone Encounter (Signed)
Pharmacy needs ok to refill the Pts PHENobarbital (LUMINAL) 97.2 MG tablet  Early because she was staying in a hotel in [place] and left it and tried to get it back and it was gone.  Please call pharmacy back.

## 2017-01-09 NOTE — Telephone Encounter (Signed)
Pt called regarding this, she states she has missed the medication for 2 days and is anxious, when she was younger she missed it for 3 days and had a seizure. She was advised that Dr. Ronnald Ramp if off today and she is asking for someone just to approve enough for the weekend.

## 2017-01-09 NOTE — Telephone Encounter (Signed)
Called pharmacy and gave okay to fill.   Pt informed of same.

## 2017-02-06 ENCOUNTER — Other Ambulatory Visit: Payer: Self-pay | Admitting: Internal Medicine

## 2017-02-16 ENCOUNTER — Other Ambulatory Visit: Payer: Self-pay | Admitting: Internal Medicine

## 2017-02-16 DIAGNOSIS — F411 Generalized anxiety disorder: Secondary | ICD-10-CM

## 2017-02-16 NOTE — Telephone Encounter (Signed)
Faxed script back to [facility]armacy...Johny Chess

## 2017-02-24 ENCOUNTER — Other Ambulatory Visit: Payer: Self-pay | Admitting: Internal Medicine

## 2017-02-24 ENCOUNTER — Ambulatory Visit (INDEPENDENT_AMBULATORY_CARE_PROVIDER_SITE_OTHER): Payer: PPO | Admitting: Internal Medicine

## 2017-02-24 ENCOUNTER — Encounter: Payer: Self-pay | Admitting: Internal Medicine

## 2017-02-24 ENCOUNTER — Other Ambulatory Visit (INDEPENDENT_AMBULATORY_CARE_PROVIDER_SITE_OTHER): Payer: PPO

## 2017-02-24 VITALS — BP 110/80 | HR 75 | Temp 98.0°F | Resp 16 | Ht 63.0 in | Wt 148.0 lb

## 2017-02-24 DIAGNOSIS — R7303 Prediabetes: Secondary | ICD-10-CM

## 2017-02-24 DIAGNOSIS — Z1159 Encounter for screening for other viral diseases: Secondary | ICD-10-CM | POA: Diagnosis not present

## 2017-02-24 DIAGNOSIS — Z9884 Bariatric surgery status: Secondary | ICD-10-CM | POA: Diagnosis not present

## 2017-02-24 DIAGNOSIS — R634 Abnormal weight loss: Secondary | ICD-10-CM

## 2017-02-24 DIAGNOSIS — E559 Vitamin D deficiency, unspecified: Secondary | ICD-10-CM | POA: Diagnosis not present

## 2017-02-24 DIAGNOSIS — Z1231 Encounter for screening mammogram for malignant neoplasm of breast: Secondary | ICD-10-CM

## 2017-02-24 DIAGNOSIS — Z124 Encounter for screening for malignant neoplasm of cervix: Secondary | ICD-10-CM

## 2017-02-24 DIAGNOSIS — I10 Essential (primary) hypertension: Secondary | ICD-10-CM

## 2017-02-24 DIAGNOSIS — Z23 Encounter for immunization: Secondary | ICD-10-CM

## 2017-02-24 LAB — CBC WITH DIFFERENTIAL/PLATELET
Basophils Absolute: 0 10*3/uL (ref 0.0–0.1)
Basophils Relative: 0.6 % (ref 0.0–3.0)
EOS PCT: 1.3 % (ref 0.0–5.0)
Eosinophils Absolute: 0.1 10*3/uL (ref 0.0–0.7)
HCT: 41 % (ref 36.0–46.0)
Hemoglobin: 13.4 g/dL (ref 12.0–15.0)
LYMPHS ABS: 1.9 10*3/uL (ref 0.7–4.0)
Lymphocytes Relative: 27.3 % (ref 12.0–46.0)
MCHC: 32.8 g/dL (ref 30.0–36.0)
MCV: 88.4 fl (ref 78.0–100.0)
MONOS PCT: 5.5 % (ref 3.0–12.0)
Monocytes Absolute: 0.4 10*3/uL (ref 0.1–1.0)
NEUTROS ABS: 4.5 10*3/uL (ref 1.4–7.7)
NEUTROS PCT: 65.3 % (ref 43.0–77.0)
PLATELETS: 235 10*3/uL (ref 150.0–400.0)
RBC: 4.64 Mil/uL (ref 3.87–5.11)
RDW: 14.1 % (ref 11.5–15.5)
WBC: 6.9 10*3/uL (ref 4.0–10.5)

## 2017-02-24 LAB — COMPREHENSIVE METABOLIC PANEL
ALK PHOS: 83 U/L (ref 39–117)
ALT: 20 U/L (ref 0–35)
AST: 23 U/L (ref 0–37)
Albumin: 4.1 g/dL (ref 3.5–5.2)
BUN: 15 mg/dL (ref 6–23)
CO2: 29 mEq/L (ref 19–32)
Calcium: 9.4 mg/dL (ref 8.4–10.5)
Chloride: 102 mEq/L (ref 96–112)
Creatinine, Ser: 0.81 mg/dL (ref 0.40–1.20)
GFR: 75.39 mL/min (ref >60.00)
GLUCOSE: 101 mg/dL — AB (ref 70–99)
POTASSIUM: 4.5 meq/L (ref 3.5–5.1)
Sodium: 138 mEq/L (ref 135–145)
TOTAL PROTEIN: 6.7 g/dL (ref 6.0–8.3)
Total Bilirubin: 0.4 mg/dL (ref 0.2–1.2)

## 2017-02-24 LAB — IBC PANEL
Iron: 95 ug/dL (ref 42–145)
SATURATION RATIOS: 29.1 % (ref 20.0–50.0)
Transferrin: 233 mg/dL (ref 212.0–360.0)

## 2017-02-24 LAB — VITAMIN D 25 HYDROXY (VIT D DEFICIENCY, FRACTURES): VITD: 59.56 ng/mL (ref 30.00–100.00)

## 2017-02-24 LAB — VITAMIN B12: VITAMIN B 12: 912 pg/mL — AB (ref 211–911)

## 2017-02-24 LAB — FERRITIN: Ferritin: 56.5 ng/mL (ref 10.0–291.0)

## 2017-02-24 LAB — FOLATE: Folate: >23.9 ng/mL (ref >5.9)

## 2017-02-24 LAB — POCT GLYCOSYLATED HEMOGLOBIN (HGB A1C): Hemoglobin A1C: 5.6

## 2017-02-24 NOTE — Patient Instructions (Signed)
Multivitamin with Minerals and Iron formulations (oral solid dosage forms) What is this medicine? MULTIVITAMIN with MINERAL and IRON combinations are used to help provide good nutrition. This medicine may be used for other purposes; ask your health care provider or pharmacist if you have questions. COMMON BRAND NAME(S): Active FE, Androvite, Bacmin, Caromega, Centrum, Teacher, English as a foreign language, [employer], Administrator, sports, Government social research officer with [employer], Daily Multiple, Daily Multiple Vitamins with Minerals, Daily Vite plus Iron, Ferrocite Plus, Generix-T, Geritol, Geritol Multivitamin, Integra Plus, Megavite Multivitamin, Multilex, Multilex T&M, Multivitamin With Minerals, Niva-Plus, Nu-Iron V, One-Daily, Optivite PMT, PNV Prenatal Plus Multivitamin, Prenatal Plus, Prenatal Plus Low Iron, PreNatal Vitamins Plus, PrePLUS, Strovite Forte, Super [place], McBee Advanced, Thera-M, Thera-Tab M High Potency, Therapeutic-M, Therems, Therems-H, Therems-M, Uni-Thera M Advanced, Unicap M, Unicap SR, Unicomplex-M, Vitafol, Vol-Plus What should I tell my health care provider before I take this medicine? They need to know if you have any of these conditions: -bleeding or clotting disorder -history of anemia of any type -other chronic health condition -an unusual or allergic reaction to vitamins, minerals, other medicines, foods, dyes, or preservatives -pregnant or trying to get pregnant -breast-feeding How should I use this medicine? Take by mouth with a glass of water. May take with food. Some brands, but not all, may be chewed before swallowing. Follow the directions on the prescription or product label. The usual dose is one tablet once a day. Do not take your medicine more often than directed. Contact your pediatrician regarding the use of this medicine in children. Special care may be needed. Overdosage: If you think you have taken too much of this medicine contact a  poison control center or emergency room at once. NOTE: This medicine is only for you. Do not share this medicine with others. What if I miss a dose? If you miss a dose, take it as soon as you can. If it is almost time for your next dose, take only that dose. Do not take double or extra doses. What may interact with this medicine? -antacids -cefdinir -cefditoren -etidronate -fluoroquinolone antibiotics (examples: ciprofloxacin, gatifloxacin, levofloxacin) -levodopa -tetracycline antibiotics (examples: doxycycline, minocycline, tetracycline) -thyroid hormones -warfarin This list may not describe all possible interactions. Give your health care provider a list of all the medicines, herbs, non-prescription drugs, or dietary supplements you use. Also tell them if you smoke, drink alcohol, or use illegal drugs. Some items may interact with your medicine. What should I watch for while using this medicine? Get regular checks on your progress. Remember that vitamin and mineral supplements do not replace the need for good nutrition from a balanced diet. Talk with your health care professional if you have questions or need advice. What side effects may I notice from receiving this medicine? Side effects that you should report to your doctor or health care professional as soon as possible: -allergic reaction such as skin rash or difficulty breathing -vomiting Side effects that usually do not require medical attention (report to your doctor or health care professional if they continue or are bothersome): -nausea -stomach upset This list may not describe all possible side effects. Call your doctor for medical advice about side effects. You may report side effects to FDA at 1-800-FDA-1088. Where should I keep my medicine? Keep out of the reach of children. Most vitamins and minerals should be stored at controlled room temperature between 15 and 30 degrees C (59 and 86 degrees F). Check your specific  product directions. Protect from heat and moisture.  Throw away any unused medicine after the expiration date. NOTE: This sheet is a summary. It may not cover all possible information. If you have questions about this medicine, talk to your doctor, pharmacist, or health care provider.  2018 Elsevier/Gold Standard (2015-01-04 08:55:11)

## 2017-02-24 NOTE — Progress Notes (Signed)
Subjective:  Patient ID: Brandi Ramsey, female    DOB: July 06, 1950  Age: 66 y.o. MRN: [id number]  CC: Blood Sugar Problem   HPI Brandi Ramsey presents for f/up - She has had rapid weight loss after recent bariatric surgery. She recently saw her dentist and has lost a few crowns and they wanted her to have her vitamin levels checked. She feels well today and offers no complaints.  Outpatient Medications Prior to Visit  Medication Sig Dispense Refill  . aspirin EC 81 MG EC tablet Take 1 tablet (81 mg total) by mouth daily.    [name] atorvastatin (LIPITOR) 80 MG tablet TAKE 1 TABLET BY MOUTH DAILY AT 6 PM. 90 tablet 1  . Calcium-Vitamin D 600-200 MG-UNIT per tablet Take 1 tablet by mouth 2 (two) times daily.    . clonazePAM (KLONOPIN) 1 MG tablet TAKE 1 TABLET TWICE A DAY AS NEEDED FOR ANXIETY 45 tablet 1  . DULoxetine (CYMBALTA) 30 MG capsule Take 1 capsule (30 mg total) by mouth daily. 90 capsule 1  . pantoprazole (PROTONIX) 40 MG tablet TAKE 1 TABLET TWICE A DAY 60 tablet 5  . PHENobarbital (LUMINAL) 97.2 MG tablet TAKE 1 TABLET EVERY DAY 30 tablet 2  . ranitidine (ZANTAC) 150 MG tablet Take 150 mg by mouth 2 (two) times daily as needed (for indigestion).     . Vitamin D, Ergocalciferol, (DRISDOL) 50000 UNITS CAPS capsule Take 50,000 Units by mouth once a week. Reported on 08/24/2015     No facility-administered medications prior to visit.     ROS Review of Systems  Constitutional: Negative.  Negative for appetite change, diaphoresis, fatigue and unexpected weight change.  HENT: Negative.   Eyes: Negative for visual disturbance.  Respiratory: Negative.  Negative for cough, chest tightness, shortness of breath and wheezing.   Cardiovascular: Negative for chest pain, palpitations and leg swelling.  Gastrointestinal: Negative for abdominal pain, constipation, diarrhea, nausea and vomiting.  Endocrine: Negative.   Genitourinary: Negative.  Negative for difficulty urinating.  Musculoskeletal:  Negative.  Negative for back pain, myalgias and neck pain.  Skin: Negative for color change and rash.  Allergic/Immunologic: Negative.   Neurological: Negative.  Negative for dizziness, weakness and headaches.  Hematological: Negative for adenopathy. Does not bruise/bleed easily.  Psychiatric/Behavioral: Negative for decreased concentration, dysphoric mood, sleep disturbance and suicidal ideas. The patient is nervous/anxious.     Objective:  BP 110/80 (BP Location: Left Arm, Patient Position: Sitting, Cuff Size: Normal)   Pulse 75   Temp 98 F (36.7 C) (Oral)   Resp 16   Ht 5\' 3"  (1.6 m)   Wt 148 lb (67.1 kg)   SpO2 99%   BMI 26.22 kg/m   BP Readings from Last 3 Encounters:  02/24/17 110/80  11/18/16 118/84  10/15/16 108/68    Wt Readings from Last 3 Encounters:  02/24/17 148 lb (67.1 kg)  11/18/16 162 lb (73.5 kg)  10/15/16 170 lb (77.1 kg)    Physical Exam  Constitutional: She is oriented to person, place, and time. No distress.  HENT:  Mouth/Throat: Oropharynx is clear and moist. No oropharyngeal exudate.  Eyes: Conjunctivae are normal. Right eye exhibits no discharge. Left eye exhibits no discharge. No scleral icterus.  Neck: Normal range of motion. Neck supple. No JVD present. No thyromegaly present.  Cardiovascular: Normal rate, regular rhythm and intact distal pulses.  Exam reveals no gallop and no friction rub.   No murmur heard. Pulmonary/Chest: Effort normal and breath sounds normal.  No respiratory distress. She has no wheezes. She has no rales. She exhibits no tenderness.  Abdominal: Soft. Bowel sounds are normal. She exhibits no distension and no mass. There is no tenderness. There is no rebound and no guarding.  Musculoskeletal: Normal range of motion. She exhibits no edema, tenderness or deformity.  Lymphadenopathy:    She has no cervical adenopathy.  Neurological: She is alert and oriented to person, place, and time.  Skin: Skin is warm and dry. No rash  noted. She is not diaphoretic. No erythema. No pallor.  Psychiatric: She has a normal mood and affect. Her behavior is normal. Judgment and thought content normal.  Vitals reviewed.   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 Dizziness. Posterior head and neck pain. Subsequent encounter. Creatinine was obtained on site at [facility] at [address]Results: Creatinine 1.0 mg/dL. EXAM: MRI HEAD WITHOUT AND WITH CONTRAST MRI CERVICAL SPINE WITHOUT AND WITH CONTRAST TECHNIQUE: Multiplanar, multiecho pulse sequences of the brain and surrounding structures, and cervical spine, to include the craniocervical junction and cervicothoracic junction, were obtained without and with intravenous contrast. CONTRAST:  50mL MULTIHANCE GADOBENATE DIMEGLUMINE 529 MG/ML IV SOLN COMPARISON:  01/29/2015 CT.  08/27/2013 in 09/2009 2012 brain MR FINDINGS: MRI HEAD FINDINGS Some of the sequences are motion degraded. No acute infarct. No intracranial hemorrhage. Slight progression of moderate nonspecific white matter type changes most notable periventricular region. In a diabetic hypertensive patient of this age, findings may reflect result of small vessel disease. With history multiple sclerosis, demyelinating process is also  consideration although appearance is not specific for such. None of these areas demonstrate enhancement or restricted motion as can be seen with areas of active demyelination. None of these areas demonstrate mass effect as can be seen with progressive multifocal leukoencephalopathy. No intracranial mass or abnormal enhancement. Hyperostosis frontalis interna incidentally noted. Mild global atrophy most notable frontal -temporal lobes without hydrocephalus. Major intracranial vascular structures are patent. Cervical medullary junction, pituitary region, pineal region and orbital structures unremarkable. Post lens replacement. No evidence of mesial temporal sclerosis. MRI CERVICAL SPINE FINDINGS Some of the sequences are motion degraded. Cervical medullary junction unremarkable. Mild motion artifact extends through the cervical cord without definitive focal cord signal abnormality. No abnormal enhancement. Mild kyphosis cervical spine centered at C5 level. Left C4 facet slightly anteriorly located. This appears at most minimally different from the 08/27/2013 examination. If there were any suspicion of instability, flexion/ extension views can be obtained for further delineation. There is no evidence of bony edema soft tissue edema to suggest acute osseous or soft tissue injury. C2-3:  Negative. C3-4: Shallow disc osteophyte greater right paracentral position. Slight narrowing ventral aspect of the thecal  sac without cord compression. C4-5: Shallow disc osteophyte complex slightly greater to right. Mild narrowing ventral aspect of the thecal sac. C5-6: Broad-based disc osteophyte complex greatest extension left paracentral position. Narrowing ventral aspect of the thecal sac greater on left with mild left-sided cord flattening. Left uncinate hypertrophy. Moderate left foraminal narrowing. C6-7: Broad-based disc osteophyte complex slightly greater to left. Narrowing ventral aspect of the thecal sac without cord  compression. Mild foraminal narrowing greater on the left. C7-T1: Mild posterior element hypertrophy greater on the left. Slight impression dorsal aspect of the thecal sac greater on the left. IMPRESSION: MRI HEAD FINDINGS Some of the sequences are motion degraded. Slight progression of moderate nonspecific white matter type changes most notable periventricular region. In a diabetic hypertensive patient of this age, findings may reflect result of small vessel disease. With history multiple sclerosis, demyelinating process is also consideration although appearance is not specific for such. No acute infarct or intracranial hemorrhage. No intracranial mass or abnormal enhancement. Mild global atrophy. MRI CERVICAL SPINE Some of the sequences are motion degraded. Mild motion artifact extends through the cervical cord without definitive focal cord signal abnormality. No abnormal enhancement. Mild kyphosis cervical spine centered at C5 level. Left C4 facet slightly anteriorly located. This appears at most minimally different from the 08/27/2013 examination. If there were any suspicion of instability, flexion/ extension views can be obtained for further delineation. There is no evidence of bony edema soft tissue edema to suggest acute osseous or soft tissue injury. Cervical spondylotic changes most notable C5-6 level. Summary of pertinent findings includes: C3-4 shallow disc osteophyte greater right paracentral position. Slight narrowing ventral aspect of the thecal sac without cord compression. C4-5 shallow disc osteophyte complex slightly greater to right. Mild narrowing ventral aspect of the thecal sac. C5-6 broad-based disc osteophyte complex greatest extension left paracentral position. Narrowing ventral aspect of the thecal sac greater on left with mild left-sided cord flattening. Left uncinate hypertrophy. Moderate left foraminal narrowing. C6-7 broad-based disc osteophyte complex slightly greater to left. Narrowing  ventral aspect of the thecal sac without cord compression. Mild foraminal narrowing greater on the left. C7-T1 mild posterior element hypertrophy greater on the left. Slight impression dorsal aspect of the thecal sac greater on the left. Electronically Signed   By: Genia Del M.D.   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 Dizziness. Posterior head and neck pain. Subsequent encounter. Creatinine was obtained on site at [facility] at [address]Results: Creatinine 1.0 mg/dL. EXAM: MRI HEAD WITHOUT AND WITH CONTRAST MRI CERVICAL SPINE WITHOUT AND WITH CONTRAST TECHNIQUE: Multiplanar, multiecho pulse sequences of the brain and surrounding structures, and cervical spine, to include the craniocervical junction and cervicothoracic junction, were obtained without and with intravenous contrast. CONTRAST:  53mL MULTIHANCE GADOBENATE DIMEGLUMINE 529 MG/ML IV SOLN COMPARISON:  01/29/2015 CT.  08/27/2013 in 09/2009 2012 brain MR FINDINGS: MRI HEAD FINDINGS Some of the sequences are motion degraded. No acute infarct. No intracranial hemorrhage. Slight progression of moderate nonspecific white matter type changes most notable periventricular region. In a diabetic hypertensive patient of this age, findings may reflect result of small vessel disease. With history multiple sclerosis, demyelinating process is also consideration although appearance is not specific for such. None of these areas demonstrate enhancement or restricted motion as can be seen with areas  of active demyelination. None of these areas demonstrate mass effect as can be seen with progressive multifocal leukoencephalopathy. No intracranial mass or abnormal enhancement. Hyperostosis frontalis interna incidentally noted. Mild  global atrophy most notable frontal -temporal lobes without hydrocephalus. Major intracranial vascular structures are patent. Cervical medullary junction, pituitary region, pineal region and orbital structures unremarkable. Post lens replacement. No evidence of mesial temporal sclerosis. MRI CERVICAL SPINE FINDINGS Some of the sequences are motion degraded. Cervical medullary junction unremarkable. Mild motion artifact extends through the cervical cord without definitive focal cord signal abnormality. No abnormal enhancement. Mild kyphosis cervical spine centered at C5 level. Left C4 facet slightly anteriorly located. This appears at most minimally different from the 08/27/2013 examination. If there were any suspicion of instability, flexion/ extension views can be obtained for further delineation. There is no evidence of bony edema soft tissue edema to suggest acute osseous or soft tissue injury. C2-3:  Negative. C3-4: Shallow disc osteophyte greater right paracentral position. Slight narrowing ventral aspect of the thecal sac without cord compression. C4-5: Shallow disc osteophyte complex slightly greater to right. Mild narrowing ventral aspect of the thecal sac. C5-6: Broad-based disc osteophyte complex greatest extension left paracentral position. Narrowing ventral aspect of the thecal sac greater on left with mild left-sided cord flattening. Left uncinate hypertrophy. Moderate left foraminal narrowing. C6-7: Broad-based disc osteophyte complex slightly greater to left. Narrowing ventral aspect of the thecal sac without cord compression. Mild foraminal narrowing greater on the left. C7-T1: Mild posterior element hypertrophy greater on the left. Slight impression dorsal aspect of the thecal sac greater on the left. IMPRESSION: MRI HEAD FINDINGS Some of the sequences are motion degraded. Slight progression of moderate nonspecific white matter type changes most notable periventricular region. In a diabetic  hypertensive patient of this age, findings may reflect result of small vessel disease. With history multiple sclerosis, demyelinating process is also consideration although appearance is not specific for such. No acute infarct or intracranial hemorrhage. No intracranial mass or abnormal enhancement. Mild global atrophy. MRI CERVICAL SPINE Some of the sequences are motion degraded. Mild motion artifact extends through the cervical cord without definitive focal cord signal abnormality. No abnormal enhancement. Mild kyphosis cervical spine centered at C5 level. Left C4 facet slightly anteriorly located. This appears at most minimally different from the 08/27/2013 examination. If there were any suspicion of instability, flexion/ extension views can be obtained for further delineation. There is no evidence of bony edema soft tissue edema to suggest acute osseous or soft tissue injury. Cervical spondylotic changes most notable C5-6 level. Summary of pertinent findings includes: C3-4 shallow disc osteophyte greater right paracentral position. Slight narrowing ventral aspect of the thecal sac without cord compression. C4-5 shallow disc osteophyte complex slightly greater to right. Mild narrowing ventral aspect of the thecal sac. C5-6 broad-based disc osteophyte complex greatest extension left paracentral position. Narrowing ventral aspect of the thecal sac greater on left with mild left-sided cord flattening. Left uncinate hypertrophy. Moderate left foraminal narrowing. C6-7 broad-based disc osteophyte complex slightly greater to left. Narrowing ventral aspect of the thecal sac without cord compression. Mild foraminal narrowing greater on the left. C7-T1 mild posterior element hypertrophy greater on the left. Slight impression dorsal aspect of the thecal sac greater on the left. Electronically Signed   By: Genia Del M.D.   On: 03/16/2015 00:05    Assessment & Plan:   Brandi Ramsey was seen today for blood sugar  problem.  Diagnoses and all orders for this visit:  Prediabetes- her  A1c is down to 5.6%. Her blood sugars are adequately well controlled. -     POCT glycosylated hemoglobin (Hb A1C)  Avitaminosis D- her vitamin D level is in the normal range. We'll continue vitamin D replacement therapy. -     VITAMIN D 25 Hydroxy (Vit-D Deficiency, Fractures); Future  Visit for screening mammogram -     MM DIGITAL SCREENING BILATERAL; Future  Need for hepatitis C screening test -     Hepatitis C antibody; Future  Essential (primary) hypertension- her blood pressure is adequately well controlled.  Status post laparoscopic sleeve gastrectomy- she has had a remarkable weight loss and is doing well. Will monitor her for vitamin deficiencies. -     Vitamin B12; Future -     Vitamin B1; Future -     IBC panel; Future -     Ferritin; Future -     Folate; Future  Weight loss, abnormal- her labs are negative for any secondary causes, signs of malignancy, or metabolic causes. This is consistent with her recent bariatric surgery. -     HIV antibody; Future -     CBC with Differential/Platelet; Future -     Comprehensive metabolic panel; Future -     Thyroid Panel With TSH; Future  Cervical cancer screening -     Ambulatory referral to Gynecology  Need for influenza vaccination -     Flu vaccine HIGH DOSE PF (Fluzone High dose)   I am having Brandi Ramsey maintain her Vitamin D (Ergocalciferol), Calcium-Vitamin D, ranitidine, aspirin, DULoxetine, atorvastatin, PHENobarbital, clonazePAM, and pantoprazole.  No orders of the defined types were placed in this encounter.    Follow-up: Return in about 3 months (around 05/26/2017).  Scarlette Calico, MD

## 2017-02-25 LAB — HIV ANTIBODY (ROUTINE TESTING W REFLEX): HIV: NONREACTIVE

## 2017-02-25 LAB — THYROID PANEL WITH TSH
Free Thyroxine Index: 2 (ref 1.4–3.8)
T3 UPTAKE: 25 % (ref 22–35)
T4, Total: 8 ug/dL (ref 5.1–11.9)
TSH: 0.76 m[IU]/L

## 2017-02-25 LAB — HEPATITIS C ANTIBODY: HCV Ab: NONREACTIVE

## 2017-03-01 ENCOUNTER — Encounter: Payer: Self-pay | Admitting: Internal Medicine

## 2017-03-01 LAB — VITAMIN B1: Vitamin B1 (Thiamine): 45 nmol/L — ABNORMAL HIGH (ref 8–30)

## 2017-03-02 ENCOUNTER — Encounter: Payer: Self-pay | Admitting: Internal Medicine

## 2017-03-09 ENCOUNTER — Other Ambulatory Visit: Payer: Self-pay | Admitting: Internal Medicine

## 2017-03-10 NOTE — Telephone Encounter (Signed)
rx faxed to [facility] 

## 2017-03-18 ENCOUNTER — Encounter: Payer: Self-pay | Admitting: Gynecology

## 2017-03-18 ENCOUNTER — Ambulatory Visit (INDEPENDENT_AMBULATORY_CARE_PROVIDER_SITE_OTHER): Payer: PPO | Admitting: Gynecology

## 2017-03-18 VITALS — BP 118/78 | Ht 63.5 in | Wt 141.0 lb

## 2017-03-18 DIAGNOSIS — N952 Postmenopausal atrophic vaginitis: Secondary | ICD-10-CM

## 2017-03-18 DIAGNOSIS — Z1272 Encounter for screening for malignant neoplasm of vagina: Secondary | ICD-10-CM | POA: Diagnosis not present

## 2017-03-18 DIAGNOSIS — K648 Other hemorrhoids: Secondary | ICD-10-CM | POA: Diagnosis not present

## 2017-03-18 DIAGNOSIS — Z01411 Encounter for gynecological examination (general) (routine) with abnormal findings: Secondary | ICD-10-CM | POA: Diagnosis not present

## 2017-03-18 DIAGNOSIS — Z8041 Family history of malignant neoplasm of ovary: Secondary | ICD-10-CM

## 2017-03-18 LAB — HM PAP SMEAR

## 2017-03-18 NOTE — Progress Notes (Signed)
    Pascale Maves Leuthold 1950-12-02 [id number]        66 y.o.  [id number] new patient for annual gynecologic exam.  It has been a number of years since she has had a routine gynecologic exam and her primary physician recommended she follow up for this. She is having no GYN complaints.  Past medical history,surgical history, problem list, medications, allergies, family history and social history were all reviewed and documented as reviewed in the EPIC chart.  ROS:  Performed with pertinent positives and negatives included in the history, assessment and plan.   Additional significant findings :  None   Exam: Caryn Bee assistant Vitals:   03/18/17 1533  BP: 118/78  Weight: 141 lb (64 kg)  Height: 5' 3.5" (1.613 m)   Body mass index is 24.59 kg/m.  General appearance:  Normal affect, orientation and appearance. Skin: Grossly normal HEENT: Without gross lesions.  No cervical or supraclavicular adenopathy. Thyroid normal.  Lungs:  Clear without wheezing, rales or rhonchi Cardiac: RR, without RMG Abdominal:  Soft, nontender, without masses, guarding, rebound, organomegaly or hernia Breasts:  Examined lying and sitting without masses, retractions, discharge or axillary adenopathy.  Well-healed bilateral breast scars Pelvic:  Ext, BUS, Vagina: With atrophic changes. Pap smear cuff done  Adnexa: Without masses or tenderness    Anus and perineum: Normal   Rectovaginal: Normal sphincter tone without palpated masses or tenderness.    Assessment/Plan:  66 y.o. 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   1. Postmenopausal. No significant hot flushes, night sweats, vaginal dryness. Status post TAH leiomyoma bleeding. 2. 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 Did not have breast cancer but apparently was having multiple biopsies for bilateral masses that they ultimately elected for mastectomy. Mammography number of  years ago. Is scheduled for mammography next week. Was originally told she did not need mammograms because of her mastectomies but with nipple sparing it was recommended that she continue with mammography and she has this scheduled. Breast exam that today normal and remarkable for lack of scarring from her prior reported surgeries. Does have a history a maternal grandmother of breast cancer and ovarian cancer also colon cancer within the family. We discussed and I recommended genetic counseling and screening if they feel appropriate. If genetically positive risk of ovarian cancer discussed. Also the issue of nipple sparing with residual breast tissue in that area also at risk. Patient agrees to scheduling an appointment and she knows to call my office if she does not hear from the genetic counselors within 2 weeks. 3. Pap smear 2013. Pap smear done today of the vaginal cuff. No history of abnormal Pap smears. Options to stop screening per current screening guidelines based on hysterectomy and age discussed. 4. DEXA 2016 normal. Recommended repeat DEXA at 5 year interval. 5. Colonoscopy 2017. Repeat at their recommended interval. 6. Health maintenance. No routine lab work done as patient does this elsewhere. Follow up 1 year, sooner as needed.   Anastasio Auerbach MD, 4:15 PM 03/18/2017

## 2017-03-18 NOTE — Addendum Note (Signed)
Addended by: Nelva Nay on: 03/18/2017 04:31 PM   Modules accepted: Orders

## 2017-03-18 NOTE — Patient Instructions (Signed)
Genetic counselors should call you for an appointment. If you do not hear from them within 2 weeks call my office.  Follow up in one year for annual exam

## 2017-03-19 ENCOUNTER — Ambulatory Visit: Payer: PPO

## 2017-03-19 ENCOUNTER — Telehealth: Payer: Self-pay | Admitting: *Deleted

## 2017-03-19 ENCOUNTER — Ambulatory Visit
Admission: RE | Admit: 2017-03-19 | Discharge: 2017-03-19 | Disposition: A | Discharge status: Home / Self Care | Payer: PPO | Source: Ambulatory Visit | Attending: Internal Medicine | Admitting: Internal Medicine

## 2017-03-19 DIAGNOSIS — Z803 Family history of malignant neoplasm of breast: Secondary | ICD-10-CM

## 2017-03-19 DIAGNOSIS — Z1231 Encounter for screening mammogram for malignant neoplasm of breast: Secondary | ICD-10-CM

## 2017-03-19 DIAGNOSIS — Z800 Family history of malignant neoplasm of digestive organs: Secondary | ICD-10-CM

## 2017-03-19 HISTORY — DX: Diffuse cystic mastopathy of unspecified breast: N60.19

## 2017-03-19 LAB — PAP IG W/ RFLX HPV ASCU

## 2017-03-19 LAB — HM MAMMOGRAPHY

## 2017-03-19 NOTE — Telephone Encounter (Signed)
Referral placed they will contact pt to schedule. 

## 2017-03-19 NOTE — Telephone Encounter (Signed)
-----   Message from Anastasio Auerbach, MD sent at 03/18/2017  4:20 PM EDT ----- Schedule an appointment with [facility] genetic counselors reference maternal grandmother history of breast and ovarian cancer as well as colon cancer in the family.

## 2017-04-01 ENCOUNTER — Other Ambulatory Visit: Payer: Self-pay | Admitting: Internal Medicine

## 2017-04-01 DIAGNOSIS — F320 Major depressive disorder, single episode, mild: Secondary | ICD-10-CM

## 2017-04-01 IMAGING — US US SOFT TISSUE HEAD/NECK
1 series · 14 of 25 positions shown · non-contrast
Comparison: None available currently.

CLINICAL DATA: Goiter.

EXAM:
THYROID ULTRASOUND
TECHNIQUE: Ultrasound examination of the thyroid gland and adjacent soft
tissues was performed.

[Series 1: us soft tissue head/neck · 0.08mm/px · 14 of 49 slices shown]
[im 1/49]
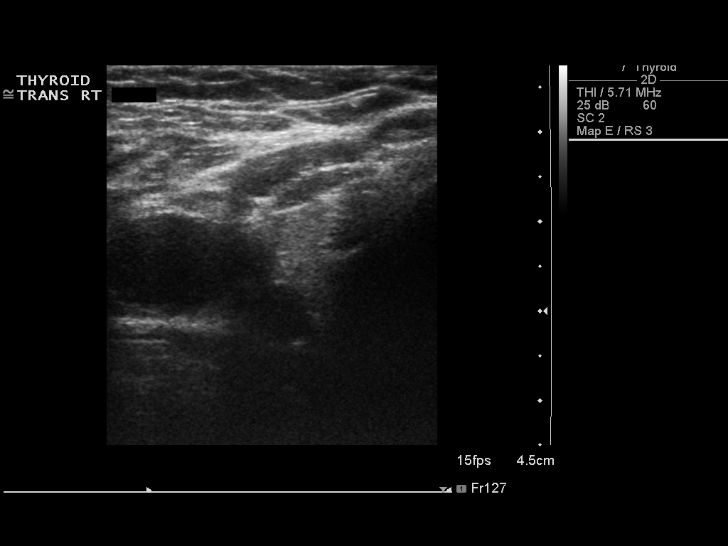
[im 5/49]
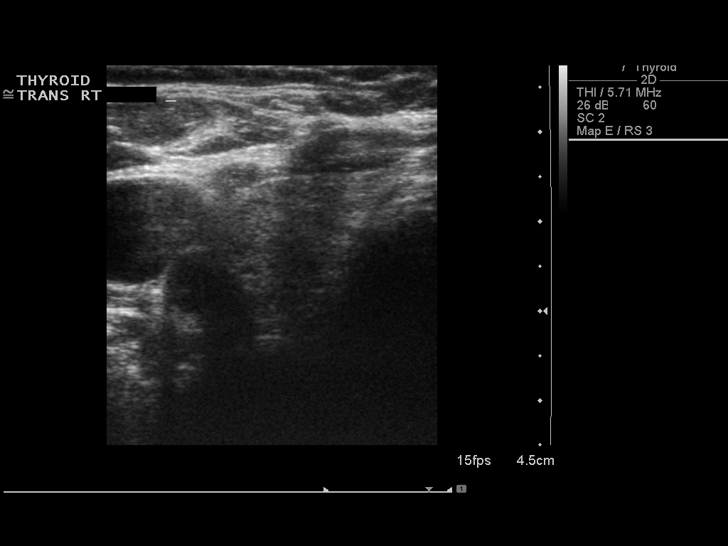
[im 9/49]
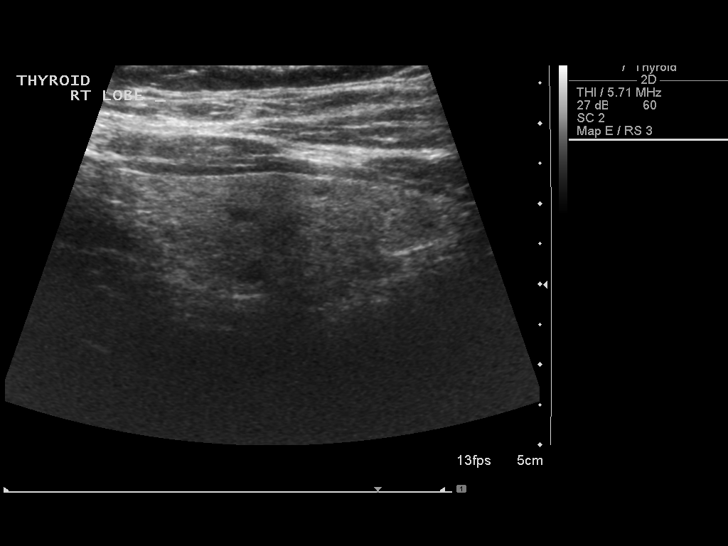
[im 13/49]
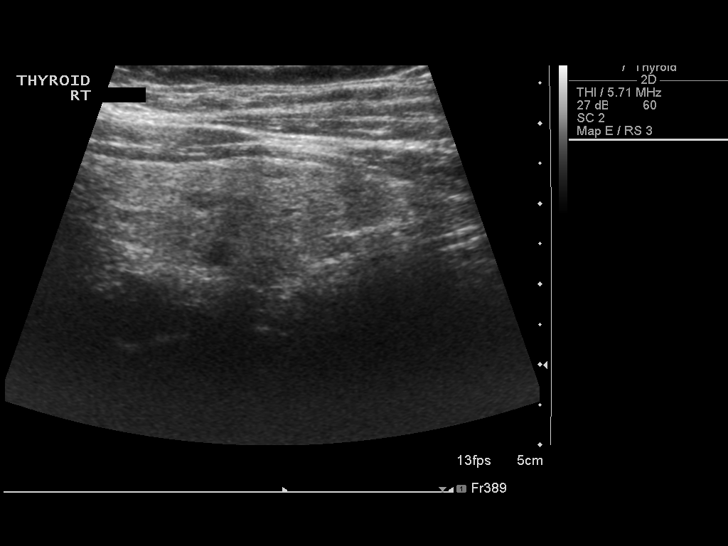
[im 17/49]
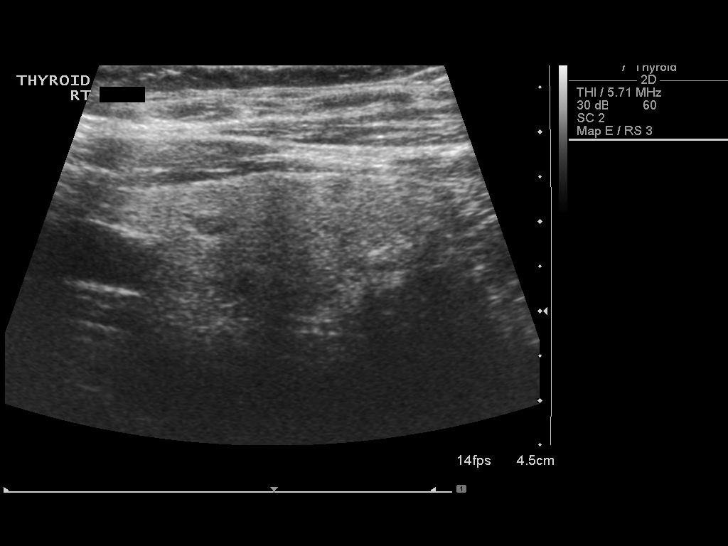
[im 19/49]
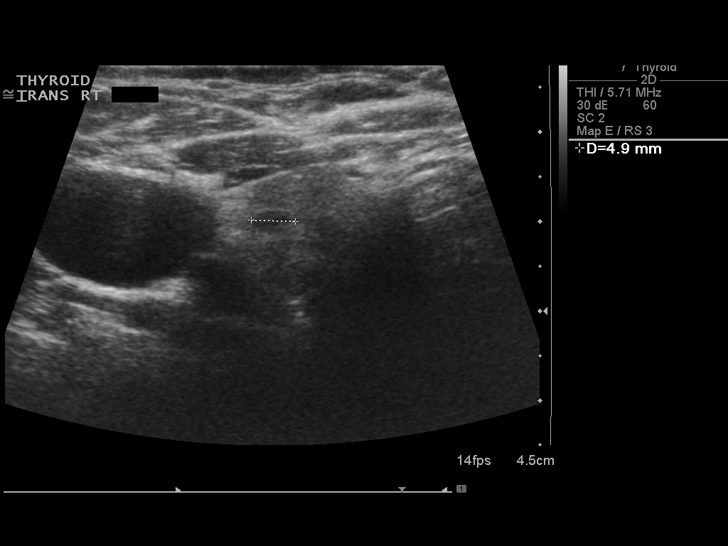
[im 23/49]
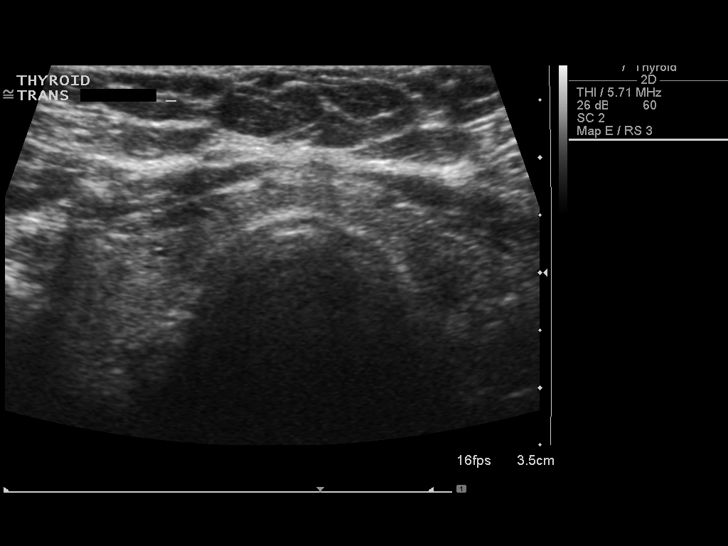
[im 27/49]
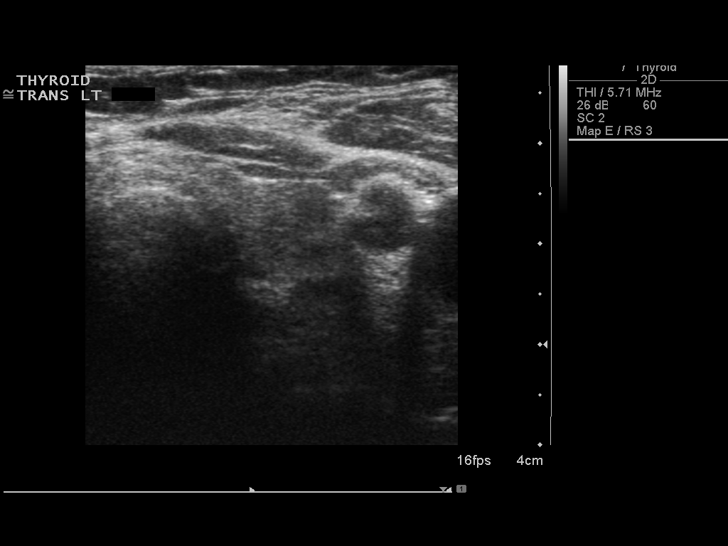
[im 31/49]
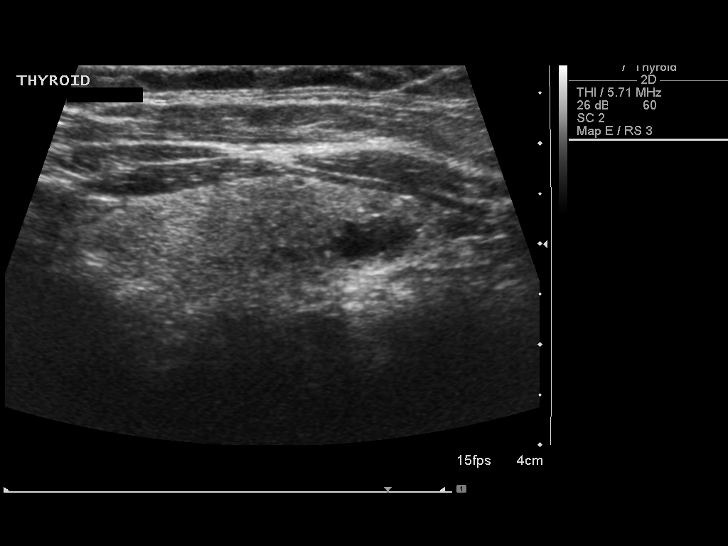
[im 33/49]
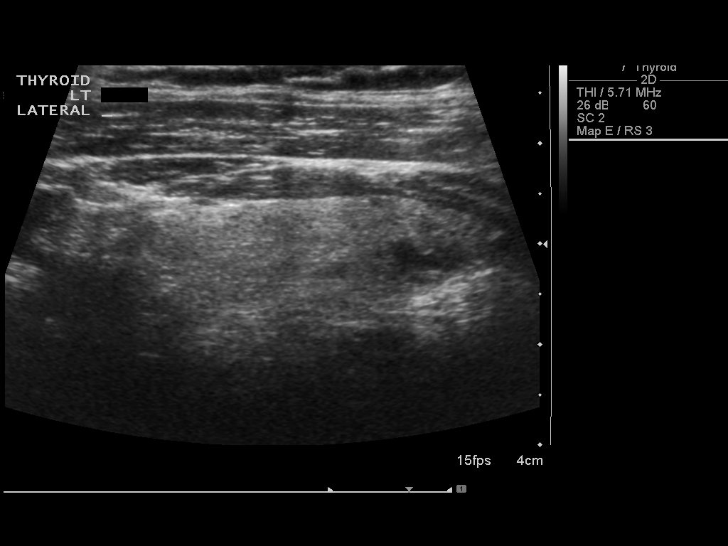
[im 37/49]
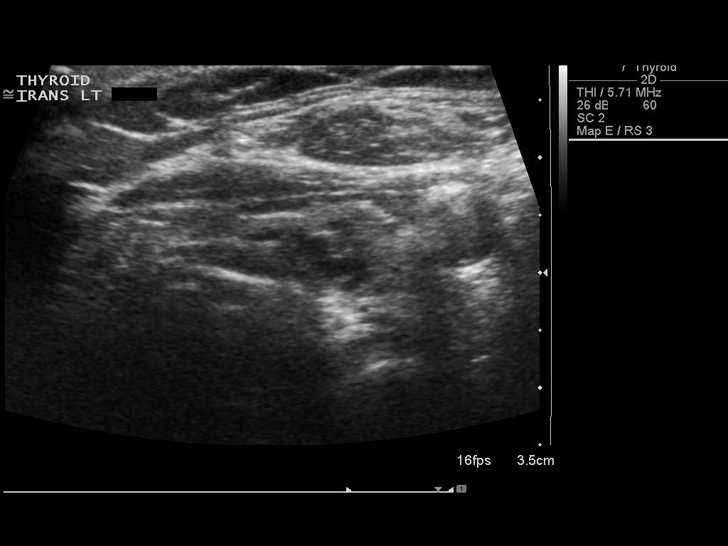
[im 41/49]
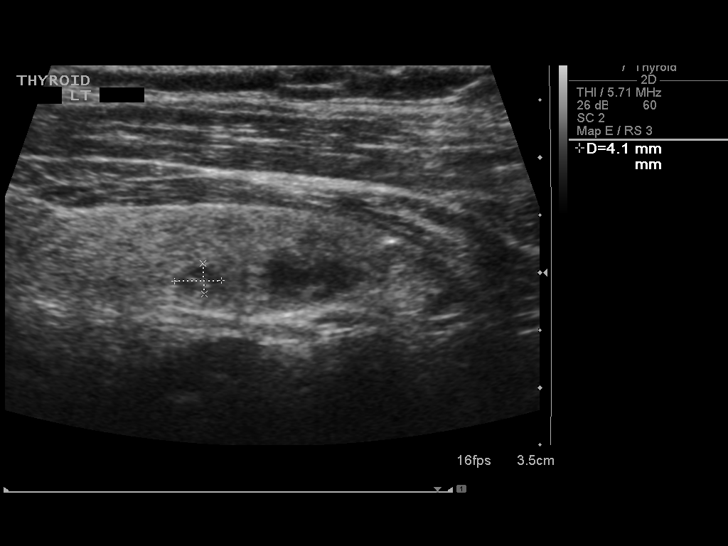
[im 45/49]
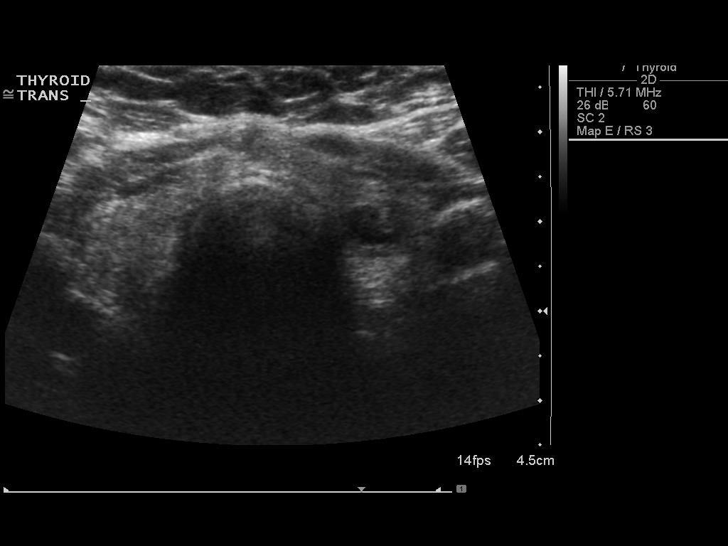
[im 49/49]
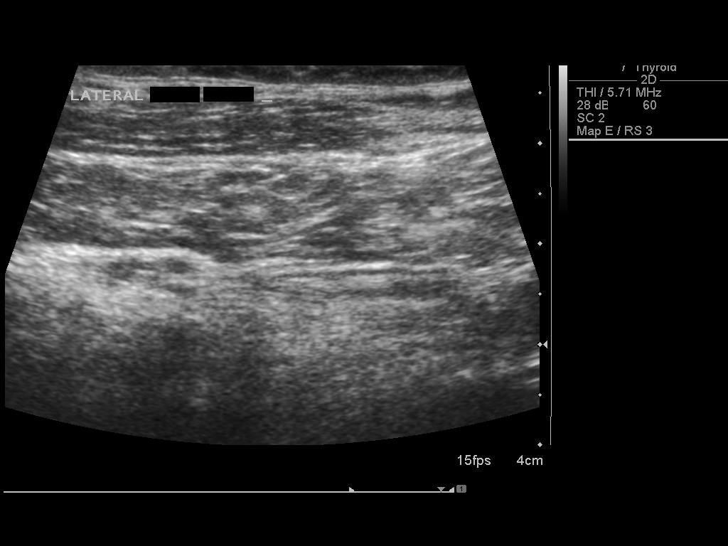

[14 of 25 positions shown; findings below may reference images not displayed]

FINDINGS: Right thyroid lobe

Measurements: 4.7 x 1.7 x 1.5 cm. Two hypoechoic nodules are noted
in the mid pole, each measuring 5 mm.

Left thyroid lobe

Measurements: 3.9 x 1.3 x 1.1 cm. Complex nodule measuring 9 x 6 x 6
mm is noted in inferior pole.

Isthmus

Thickness: 3 mm.  No nodules visualized.

Lymphadenopathy

None visualized.
IMPRESSION: Small nodules are noted bilaterally. Findings do not meet current
SRU consensus criteria for biopsy. Follow-up by clinical exam is
recommended. If patient has known risk factors for thyroid
carcinoma, consider follow-up ultrasound in 12 months. If patient is
clinically hyperthyroid, consider nuclear medicine thyroid uptake
and scan.Reference: Management of Thyroid Nodules Detected at US:
Society of Radiologists in Ultrasound Consensus Conference

## 2017-04-02 DIAGNOSIS — M546 Pain in thoracic spine: Secondary | ICD-10-CM | POA: Diagnosis not present

## 2017-04-06 DIAGNOSIS — J4540 Moderate persistent asthma, uncomplicated: Secondary | ICD-10-CM | POA: Diagnosis not present

## 2017-04-06 DIAGNOSIS — G35 Multiple sclerosis: Secondary | ICD-10-CM | POA: Diagnosis not present

## 2017-04-06 DIAGNOSIS — K449 Diaphragmatic hernia without obstruction or gangrene: Secondary | ICD-10-CM | POA: Diagnosis not present

## 2017-04-06 DIAGNOSIS — J449 Chronic obstructive pulmonary disease, unspecified: Secondary | ICD-10-CM | POA: Diagnosis not present

## 2017-04-24 ENCOUNTER — Other Ambulatory Visit: Payer: Self-pay | Admitting: Internal Medicine

## 2017-05-21 ENCOUNTER — Ambulatory Visit: Payer: PPO | Admitting: Internal Medicine

## 2017-05-25 NOTE — Telephone Encounter (Signed)
Per genetic counselor office   "We have been unable to reach the pt. The referral will be canceled due to no contact from the pt.   Thank you.  Seth Bake

## 2017-05-27 DIAGNOSIS — E785 Hyperlipidemia, unspecified: Secondary | ICD-10-CM | POA: Diagnosis not present

## 2017-05-27 DIAGNOSIS — K219 Gastro-esophageal reflux disease without esophagitis: Secondary | ICD-10-CM | POA: Diagnosis not present

## 2017-05-27 DIAGNOSIS — Z9884 Bariatric surgery status: Secondary | ICD-10-CM | POA: Diagnosis not present

## 2017-06-01 ENCOUNTER — Ambulatory Visit: Payer: PPO | Admitting: Internal Medicine

## 2017-06-02 ENCOUNTER — Ambulatory Visit: Payer: PPO | Admitting: Internal Medicine

## 2017-06-07 ENCOUNTER — Other Ambulatory Visit: Payer: Self-pay | Admitting: Internal Medicine

## 2017-06-08 ENCOUNTER — Other Ambulatory Visit: Payer: Self-pay | Admitting: Internal Medicine

## 2017-06-08 ENCOUNTER — Telehealth: Payer: Self-pay | Admitting: Internal Medicine

## 2017-06-08 DIAGNOSIS — F411 Generalized anxiety disorder: Secondary | ICD-10-CM

## 2017-06-08 MED ORDER — CLONAZEPAM 1 MG PO TABS: ORAL_TABLET | ORAL | 2 refills | Status: DC

## 2017-06-08 NOTE — Telephone Encounter (Signed)
Check [place] registry last filled phenobarbital 05/06/17 & Klonopin 03/25/17.Marland KitchenJohny Ramsey

## 2017-06-08 NOTE — Telephone Encounter (Signed)
Controlled substance 

## 2017-06-08 NOTE — Telephone Encounter (Signed)
Copied from [facility] [phone]. Topic: Quick Communication - Rx Refill/Question >> Jun 08, 2017  1:05 PM Robina Ade, Helene Kelp D wrote: Has the patient contacted their pharmacy? Yes (Agent: If no, request that the patient contact the pharmacy for the refill.) Preferred Pharmacy (with phone number or street name): [id number] - [place], [place] - [address] Agent: Please be advised that RX refills may take up to 3 business days. We ask that you follow-up with your pharmacy. Patient needs refill on her PHENobarbital (LUMINAL) 97.2 MG tablet and clonazePAM (KLONOPIN) 1 MG tablet.

## 2017-06-09 NOTE — Telephone Encounter (Signed)
Called pt no answer LMOM [facility]

## 2017-06-18 ENCOUNTER — Encounter: Payer: Self-pay | Admitting: Family Medicine

## 2017-06-18 ENCOUNTER — Ambulatory Visit: Payer: Self-pay

## 2017-06-18 ENCOUNTER — Ambulatory Visit: Payer: PPO | Admitting: Family Medicine

## 2017-06-18 ENCOUNTER — Ambulatory Visit: Payer: PPO | Admitting: Internal Medicine

## 2017-06-18 VITALS — BP 100/84 | HR 99 | Temp 98.1°F | Ht 63.0 in | Wt 138.0 lb

## 2017-06-18 DIAGNOSIS — J029 Acute pharyngitis, unspecified: Secondary | ICD-10-CM | POA: Diagnosis not present

## 2017-06-18 LAB — POCT RAPID STREP A (OFFICE): Rapid Strep A Screen: NEGATIVE

## 2017-06-18 NOTE — Telephone Encounter (Signed)
   Reason for Disposition . SEVERE (e.g., excruciating) throat pain  Answer Assessment - Initial Assessment Questions 1. ONSET: "When did the throat start hurting?" (Hours or days ago)      Started yesterday 2. SEVERITY: "How bad is the sore throat?" (Scale 1-10; mild, moderate or severe)   - MILD (1-3):  doesn't interfere with eating or normal activities   - MODERATE (4-7): interferes with eating some solids and normal activities   - SEVERE (8-10):  excruciating pain, interferes with most normal activities   - SEVERE DYSPHAGIA: can't swallow liquids, drooling     Moderate 3. STREP EXPOSURE: "Has there been any exposure to strep within the past week?" If so, ask: "What type of contact occurred?"      No 4.  VIRAL SYMPTOMS: "Are there any symptoms of a cold, such as a runny nose, cough, hoarse voice or red eyes?"      Runny nose - clear drainage 5. FEVER: "Do you have a fever?" If so, ask: "What is your temperature, how was it measured, and when did it start?"     101.6 ORAL 6. PUS ON THE TONSILS: "Is there pus on the tonsils in the back of your throat?"     uNSURE 7. OTHER SYMPTOMS: "Do you have any other symptoms?" (e.g., difficulty breathing, headache, rash)     Headache 8. PREGNANCY: "Is there any chance you are pregnant?" "When was your last menstrual period?"     No  Protocols used: SORE THROAT-A-AH  Appointment made for today.

## 2017-06-18 NOTE — Patient Instructions (Addendum)
IF you received an x-ray today, you will receive an invoice from [facility] Radiology. Please contact [facility] at [phone] with questions or concerns regarding your invoice.   IF you received labwork today, you will receive an invoice from [facility]. Please contact [facility] at [phone] with questions or concerns regarding your invoice.   Our billing staff will not be able to assist you with questions regarding bills from these companies.  You will be contacted with the lab results as soon as they are available. The fastest way to get your results is to activate your My Chart account. Instructions are located on the last page of this paperwork. If you have not heard from [place] regarding the results in 2 weeks, please contact this office.        IF you received an x-ray today, you will receive an invoice from [facility] Rad[facility]. Please contact [facility] Radiology at [phone] with questions or concerns regarding your invoice.   IF you received labwork today, you will receive an invoice from [place]. Please contact [facility] at [phone] with questions or concerns regarding your invoice.   Our billing staff will not be able to assist you with questions regarding bills from these companies.  You will be contacted with the lab results as soon as they are available. The fastest way to get your results is to activate your My Chart account. Instructions are located on the last page of this paperwork. If you have not heard from [place] regarding the results in 2 weeks, please contact this office.     Pharyngitis Pharyngitis is redness, pain, and swelling (inflammation) of the throat (pharynx). It is a very common cause of sore throat. Pharyngitis can be caused by a bacteria, but it is usually caused by a virus. Most cases of pharyngitis get better on their own without treatment. What are the causes? This condition may be caused by:  Infection by viruses (viral). Viral  pharyngitis spreads from person to person (is contagious) through coughing, sneezing, and sharing of personal items or utensils such as cups, forks, spoons, and toothbrushes.  Infection by bacteria (bacterial). Bacterial pharyngitis may be spread by touching the nose or face after coming in contact with the bacteria, or through more intimate contact, such as kissing.  Allergies. Allergies can cause buildup of mucus in the throat (post-nasal drip), leading to inflammation and irritation. Allergies can also cause blocked nasal passages, forcing breathing through the mouth, which dries and irritates the throat.  What increases the risk? You are more likely to develop this condition if:  You are 33-65 years old.  You are exposed to crowded environments such as daycare, school, or dormitory living.  You live in a cold climate.  You have a weakened disease-fighting (immune) system.  What are the signs or symptoms? Symptoms of this condition vary by the cause (viral, bacterial, or allergies) and can include:  Sore throat.  Fatigue.  Low-grade fever.  Headache.  Joint pain and muscle aches.  Skin rashes.  Swollen glands in the throat (lymph nodes).  Plaque-like film on the throat or tonsils. This is often a symptom of bacterial pharyngitis.  Vomiting.  Stuffy nose (nasal congestion).  Cough.  Red, itchy eyes (conjunctivitis).  Loss of appetite.  How is this diagnosed? This condition is often diagnosed based on your medical history and a physical exam. Your health care provider will ask you questions about your illness and your symptoms. A swab of your throat may be done to  check for bacteria (rapid strep test). Other lab tests may also be done, depending on the suspected cause, but these are rare. How is this treated? This condition usually gets better in 3-4 days without medicine. Bacterial pharyngitis may be treated with antibiotic medicines. Follow these instructions at  home:  Take over-the-counter and prescription medicines only as told by your health care provider. ? If you were prescribed an antibiotic medicine, take it as told by your health care provider. Do not stop taking the antibiotic even if you start to feel better. ? Do not give children aspirin because of the association with Reye syndrome.  Drink enough water and fluids to keep your urine clear or pale yellow.  Get a lot of rest.  Gargle with a salt-water mixture 3-4 times a day or as needed. To make a salt-water mixture, completely dissolve -1 tsp of salt in 1 cup of warm water.  If your health care provider approves, you may use throat lozenges or sprays to soothe your throat. Contact a health care provider if:  You have large, tender lumps in your neck.  You have a rash.  You cough up green, yellow-brown, or bloody spit. Get help right away if:  Your neck becomes stiff.  You drool or are unable to swallow liquids.  You cannot drink or take medicines without vomiting.  You have severe pain that does not go away, even after you take medicine.  You have trouble breathing, and it is not caused by a stuffy nose.  You have new pain and swelling in your joints such as the knees, ankles, wrists, or elbows. Summary  Pharyngitis is redness, pain, and swelling (inflammation) of the throat (pharynx).  While pharyngitis can be caused by a bacteria, the most common causes are viral.  Most cases of pharyngitis get better on their own without treatment.  Bacterial pharyngitis is treated with antibiotic medicines. This information is not intended to replace advice given to you by your health care provider. Make sure you discuss any questions you have with your health care provider. Document Released: 06/09/2005 Document Revised: 07/15/2016 Document Reviewed: 07/15/2016 Elsevier Interactive Patient Education  [employer].

## 2017-06-18 NOTE — Progress Notes (Signed)
12/27/201811:35 AM  Brandi Ramsey 03-15-1951, 66 y.o. female [id number]  Chief Complaint  Patient presents with  . Sore Throat    HPI:   Patient is a 66 y.o. female with past medical history who presents today for 2 days of sore throat, fevers (101.3), nasal congestion, mild sinus pressure, cough. Alternating tylenol and ibuprofen as fevers are seizure trigger for her. Has had flu vaccine this season. Denies any SOB or h/o lung disease. She does not smoke.   Depression screen [facility] 2/9 06/18/2017 11/18/2016  Decreased Interest 0 1  Down, Depressed, Hopeless 0 1  PHQ - 2 Score 0 2  Altered sleeping - 1  Tired, decreased energy - 1  Change in appetite - 0  Feeling bad or failure about yourself  - 0  Trouble concentrating - 0  Moving slowly or fidgety/restless - 0  Suicidal thoughts - 0  PHQ-9 Score - 4  Difficult doing work/chores - Not difficult at all    Allergies  Allergen Reactions  . Buprenorphine Hcl Hives and Itching  . Hydromorphone     Could not focus, "Seeing scary things."  Noises louder. Altered mental status  . Meperidine Hives and Itching    Other reaction(s): HIVES,ITCHING  . Metformin And Related Diarrhea  . Morphine And Related Hives and Itching    States only when mixed with Demerol  . Pentazocine Anaphylaxis  . Pentazocine Lactate Anaphylaxis  . Phenytoin Hives    Other reaction(s): RASH  . Demerol Hives and Itching  . Dilaudid [Hydromorphone Hcl]     Hallucinations and felt crazy   . Doxycycline Hyclate     Other reaction(s): GI UPSET,NAUSEA,VOMITING  . Ibuprofen     Contraindicated for risk of GI bleeding.  [name] Phenytoin Sodium Extended Hives  . Codeine Rash and Nausea Only    When mixed with Demerol Patient reports no reaction with Codeine/APAP (Tylenol #3)  . Doxycycline Hyclate Nausea And Vomiting    Other reaction(s): GI UPSET,NAUSEA,VOMITING Other reaction(s): GI UPSET,NAUSEA,VOMITING    Prior to Admission medications   Medication Sig  Start Date End Date Taking? Authorizing Provider  aspirin EC 81 MG EC tablet Take 1 tablet (81 mg total) by mouth daily. 10/25/14  Yes Ghimire, Henreitta Leber, MD  atorvastatin (LIPITOR) 80 MG tablet Take 1 tablet (80 mg total) by mouth daily at 6 PM. 04/24/17  Yes Janith Lima, MD  Calcium-Vitamin D 600-200 MG-UNIT per tablet Take 1 tablet by mouth 2 (two) times daily.   Yes [provider]  clonazePAM (KLONOPIN) 1 MG tablet TAKE 1 TABLET TWICE A DAY AS NEEDED FOR ANXIETY 06/08/17  Yes Janith Lima, MD  DULoxetine (CYMBALTA) 30 MG capsule Take 1 capsule (30 mg total) by mouth daily. 04/01/17  Yes Janith Lima, MD  pantoprazole (PROTONIX) 40 MG tablet TAKE 1 TABLET TWICE A DAY 02/24/17  Yes Janith Lima, MD  PHENobarbital (LUMINAL) 97.2 MG tablet TAKE 1 TABLET AT BEDTIME 06/08/17  Yes Janith Lima, MD  ranitidine (ZANTAC) 150 MG tablet Take 150 mg by mouth 2 (two) times daily as needed (for indigestion).    Yes [provider]  Vitamin D, Ergocalciferol, (DRISDOL) 50000 UNITS CAPS capsule Take 50,000 Units by mouth once a week. Reported on 08/24/2015   Yes [provider]    Past Medical History:  Diagnosis Date  . Anxiety   . Arthritis   . Cerebellar ataxia ([place])   . Colon polyps   . Depression   .  Diabetes mellitus without complication ([place])   . Fibrocystic breast   . Fibromyalgia   . 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 Gastric ulcer   . Hemorrhoids   . History of Doppler ultrasound    a. Carotid US 3/15: mild soft plaque origin ICA. 1-39% ICA stenosis.  [name] History of echocardiogram    a. Echo 3/15: Moderate LVH, focal basal hypertrophy, EF 60-65%, normal wall motion  . History of nuclear stress test    a. Myoview 5/16: Normal perfusion, EF 78%, low risk  . Hyperlipidemia   . Hypertension   . Obesity   . Pneumonia   . Seizures ([place])   . Stroke ([place])   . Transient cerebral ischemia   . Vertigo, benign paroxysmal     Past  Surgical History:  Procedure Laterality Date  . ABDOMINAL HYSTERECTOMY    . BREAST SURGERY     Bilateral mastectomy-TRAM  . FRACTURE SURGERY     tib/fib (R) leg  . LAPAROSCOPIC GASTRIC SLEEVE RESECTION  05/27/2016  . MASTECTOMY      Social History   Tobacco Use  . Smoking status: Never Smoker  . Smokeless tobacco: Never Used  Substance Use Topics  . Alcohol use: No    Alcohol/week: 0.0 oz    Family History  Problem Relation Age of Onset  . Raynaud syndrome Mother   . Colon polyps Mother   . Heart attack Mother   . Alzheimer's disease Father   . Diabetes Father   . Colon polyps Father   . Fragile X syndrome Son        Patient is carrier  . Diabetes Sister   . Diabetes Brother   . Cervical cancer Maternal Grandmother   . Breast cancer Maternal Grandmother   . Ovarian cancer Maternal Grandmother   . Heart failure Maternal Grandfather   . Diabetes Paternal Grandmother   . Colon polyps Sister   . Colon polyps Brother   . Colon cancer Maternal Aunt     ROS Per HPI  OBJECTIVE:  Blood pressure 100/84, pulse 99, temperature 98.1 F (36.7 C), temperature source Oral, height 5\' 3"  (1.6 m), weight 138 lb (62.6 kg), SpO2 90 %.  Physical Exam  Constitutional: She is oriented to person, place, and time and well-developed, well-nourished, and in no distress.  HENT:  Head: Normocephalic and atraumatic.  Right Ear: Hearing, tympanic membrane, external ear and ear canal normal.  Left Ear: Hearing, tympanic membrane, external ear and ear canal normal.  Mouth/Throat: Mucous membranes are normal. Posterior oropharyngeal edema and posterior oropharyngeal erythema present. No oropharyngeal exudate.  Tonsils are not enlarged.    Eyes: EOM are normal. Pupils are equal, round, and reactive to light.  Neck: Neck supple.  Cardiovascular: Normal rate, regular rhythm and normal heart sounds. Exam reveals no gallop and no friction rub.  No murmur heard. Pulmonary/Chest: Effort normal  and breath sounds normal. She has no wheezes. She has no rales.  Lymphadenopathy:    She has no cervical adenopathy.  Neurological: She is alert and oriented to person, place, and time. Gait normal.  Skin: Skin is warm and dry.    Results for orders placed or performed in visit on 06/18/17 (from the past 24 hour(s))  POCT rapid strep A     Status: None   Collection Time: 06/18/17 11:52 AM  Result Value Ref Range   Rapid Strep A Screen Negative Negative    ASSESSMENT and PLAN  1. Acute  pharyngitis, unspecified etiology Discussed supportive measures for viral pharyngitis: increase hydration, humidifier, rest, OTC medications, etc. RTC precautions discussed. - POCT rapid strep A - negative  Return if symptoms worsen or fail to improve.    Rutherford Guys, MD Primary [facility] at [facility] [place], [place] [address] Ph.  [phone] Fax [phone]

## 2017-06-19 ENCOUNTER — Ambulatory Visit (INDEPENDENT_AMBULATORY_CARE_PROVIDER_SITE_OTHER): Payer: PPO | Admitting: Family

## 2017-06-19 ENCOUNTER — Encounter: Payer: Self-pay | Admitting: Family

## 2017-06-19 VITALS — BP 122/70 | HR 74 | Temp 98.7°F | Resp 16 | Ht 63.0 in | Wt 137.8 lb

## 2017-06-19 DIAGNOSIS — R509 Fever, unspecified: Secondary | ICD-10-CM | POA: Diagnosis not present

## 2017-06-19 DIAGNOSIS — J029 Acute pharyngitis, unspecified: Secondary | ICD-10-CM | POA: Diagnosis not present

## 2017-06-19 MED ORDER — AZITHROMYCIN 250 MG PO TABS: ORAL_TABLET | ORAL | 0 refills | Status: DC

## 2017-06-22 LAB — POCT INFLUENZA A/B
Influenza A, POC: NEGATIVE
Influenza B, POC: NEGATIVE

## 2017-06-22 NOTE — Progress Notes (Signed)
Brandi Ramsey is a 66 y.o. female with the following history as recorded in EpicCare:  Patient Active Problem List   Diagnosis Date Noted  . Need for hepatitis C screening test 02/24/2017  . Status post laparoscopic sleeve gastrectomy 02/24/2017  . Weight loss, abnormal 02/24/2017  . Cervical cancer screening 02/24/2017  . Visit for screening mammogram 11/18/2016  . GAD (generalized anxiety disorder) 06/24/2016  . Fragile X associated tremor ataxia syndrome 10/10/2014  . 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 RAD (reactive airway disease) 02/01/2014  . Cataract 10/15/2013  . Acid reflux 09/13/2013  . Essential (primary) hypertension 09/13/2013  . Hyperlipidemia with target LDL less than 100 09/13/2013  . Prediabetes 09/13/2013  . TIA (transient ischemic attack) 08/27/2013  . Avitaminosis D 04/06/2013  . Allergic rhinitis 11/27/2012  . Clinical depression 11/27/2012  . Bone/cartilage disorder 11/27/2012  . Fragile X syndrome 11/27/2012  . Absence of bladder continence 11/27/2012  . Cardiac conduction disorder 11/27/2012  . DS (disseminated sclerosis) ([place]) 11/27/2012    Current Outpatient Medications  Medication Sig Dispense Refill  . aspirin EC 81 MG EC tablet Take 1 tablet (81 mg total) by mouth daily.    [name] atorvastatin (LIPITOR) 80 MG tablet Take 1 tablet (80 mg total) by mouth daily at 6 PM. 90 tablet 1  . Calcium-Vitamin D 600-200 MG-UNIT per tablet Take 1 tablet by mouth 2 (two) times daily.    . clonazePAM (KLONOPIN) 1 MG tablet TAKE 1 TABLET TWICE A DAY AS NEEDED FOR ANXIETY 45 tablet 2  . DULoxetine (CYMBALTA) 30 MG capsule Take 1 capsule (30 mg total) by mouth daily. 90 capsule 1  . pantoprazole (PROTONIX) 40 MG tablet TAKE 1 TABLET TWICE A DAY 60 tablet 5  . PHENobarbital (LUMINAL) 97.2 MG tablet TAKE 1 TABLET AT BEDTIME 30 tablet 2  . ranitidine (ZANTAC) 150 MG tablet Take 150 mg by mouth 2 (two) times daily as needed (for indigestion).     . Vitamin D,  Ergocalciferol, (DRISDOL) 50000 UNITS CAPS capsule Take 50,000 Units by mouth once a week. Reported on 08/24/2015    . azithromycin (ZITHROMAX) 250 MG tablet 2 tabs po qd x 1 day; 1 tablet per day x 4 days; 6 tablet 0   No current facility-administered medications for this visit.     Allergies: Buprenorphine hcl; Hydromorphone; Meperidine; Metformin and related; Morphine and related; Pentazocine; Pentazocine lactate; Phenytoin; Demerol; Dilaudid [hydromorphone hcl]; Doxycycline hyclate; Ibuprofen; Phenytoin sodium extended; Codeine; and Doxycycline hyclate  Past Medical History:  Diagnosis Date  . Anxiety   . Arthritis   . Cerebellar ataxia ([place])   . Colon polyps   . Depression   . Diabetes mellitus without complication ([place])   . Fibrocystic breast   . Fibromyalgia   . 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 Gastric ulcer   . Hemorrhoids   . History of Doppler ultrasound    a. Carotid US 3/15: mild soft plaque origin ICA. 1-39% ICA stenosis.  [name] History of echocardiogram    a. Echo 3/15: Moderate LVH, focal basal hypertrophy, EF 60-65%, normal wall motion  . History of nuclear stress test    a. Myoview 5/16: Normal perfusion, EF 78%, low risk  . Hyperlipidemia   . Hypertension   . Obesity   . Pneumonia   . Seizures ([place])   . Stroke ([place])   . Transient cerebral ischemia   . Vertigo, benign paroxysmal     Past Surgical History:  Procedure Laterality Date  . ABDOMINAL HYSTERECTOMY    . BREAST SURGERY     Bilateral mastectomy-TRAM  . FRACTURE SURGERY     tib/fib (R) leg  . LAPAROSCOPIC GASTRIC SLEEVE RESECTION  05/27/2016  . MASTECTOMY      Family History  Problem Relation Age of Onset  . Raynaud syndrome Mother   . Colon polyps Mother   . Heart attack Mother   . Alzheimer's disease Father   . Diabetes Father   . Colon polyps Father   . Fragile X syndrome Son        Patient is carrier  . Diabetes Sister   . Diabetes Brother   . Cervical cancer Maternal  Grandmother   . Breast cancer Maternal Grandmother   . Ovarian cancer Maternal Grandmother   . Heart failure Maternal Grandfather   . Diabetes Paternal Grandmother   . Colon polyps Sister   . Colon polyps Brother   . Colon cancer Maternal Aunt     Social History   Tobacco Use  . Smoking status: Never Smoker  . Smokeless tobacco: Never Used  Substance Use Topics  . Alcohol use: No    Alcohol/week: 0.0 oz    Subjective:  Patient was seen at [facility] yesterday with concerns for "bad sore throat" and fever; was thought to have viral URI as strep test was negative; notes her cough as worsened in the past 24 hours and is very concerned about how much worse she has started to feel. Notes she has had bronchitis in the past and very upset about the possibility of pneumonia as she lost a brother to pneumonia.   Objective:  Vitals:   06/19/17 1531  BP: 122/70  Pulse: 74  Resp: 16  Temp: 98.7 F (37.1 C)  TempSrc: Oral  SpO2: 93%  Weight: 137 lb 12.8 oz (62.5 kg)  Height: 5\' 3"  (1.6 m)    General: Well developed, well nourished, in no acute distress  Skin : Warm and dry.  Head: Normocephalic and atraumatic  Eyes: Sclera and conjunctiva clear; pupils round and reactive to light; extraocular movements intact  Ears: External normal; canals clear; tympanic membranes normal  Oropharynx: Pink, supple. No suspicious lesions Neck: Supple without thyromegaly, adenopathy  Lungs: Respirations unlabored; clear to auscultation bilaterally without wheeze, rales, rhonchi  CVS exam: normal rate and regular rhythm.  Abdomen: Soft; nontender; nondistended; normoactive bowel sounds; no masses or hepatosplenomegaly  Musculoskeletal: No deformities; no active joint inflammation  Extremities: No edema, cyanosis, clubbing  Vessels: Symmetric bilaterally  Neurologic: Alert and oriented; speech intact; face symmetrical; moves all extremities well; CNII-XII intact without focal deficit  Assessment:  1. Fever,  unspecified fever cause   2. Sore throat     Plan:  Rapid flu in office is negative; based on clinical presentation, will treat with Z-pak; increase fluids, rest and follow-up worse, no better.    No Follow-up on file.  Orders Placed This Encounter  Procedures  . POCT Influenza A/B    Requested Prescriptions   Signed Prescriptions Disp Refills  . azithromycin (ZITHROMAX) 250 MG tablet 6 tablet 0    Sig: 2 tabs po qd x 1 day; 1 tablet per day x 4 days;

## 2017-06-25 IMAGING — MR MR CERVICAL SPINE WO/W CM
17 of 22 series · 30 of 48 positions shown · IV contrast (multihance)
Comparison: 01/29/2015 CT.  08/27/2013 in [REDACTED] brain MR

CLINICAL DATA: 64-year-old diabetic hypertensive female with
fragile X syndrome, multiple sclerosis presenting with post
concussion syndrome after falling backwards 7 weeks ago. Dizziness.
Posterior head and neck pain. Subsequent encounter.

Creatinine was obtained on site at [HOSPITAL] at [HOSPITAL].Results: Creatinine 1.0 mg/dL.
EXAM:
MRI HEAD WITHOUT AND WITH CONTRAST
MRI CERVICAL SPINE WITHOUT AND WITH CONTRAST
TECHNIQUE: Multiplanar, multiecho pulse sequences of the brain and surrounding
structures, and cervical spine, to include the craniocervical
junction and cervicothoracic junction, were obtained without and
with intravenous contrast.
CONTRAST:  20mL MULTIHANCE GADOBENATE DIMEGLUMINE 529 MG/ML IV SOLN

[Series 3: T1 · sagittal · 5.0mm · 0.45mm/px · 1 of 19 slices shown (1 of 4)]
[im 1/19]
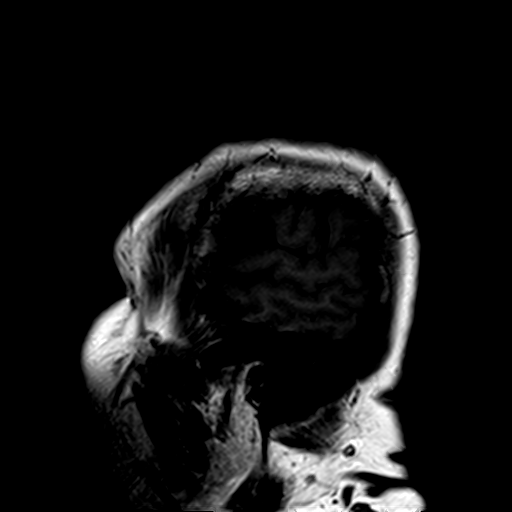

[Series 4: T2 · axial · 5.0mm · 0.45mm/px · 1 of 22 slices shown (1 of 5)]
[im 1/22]
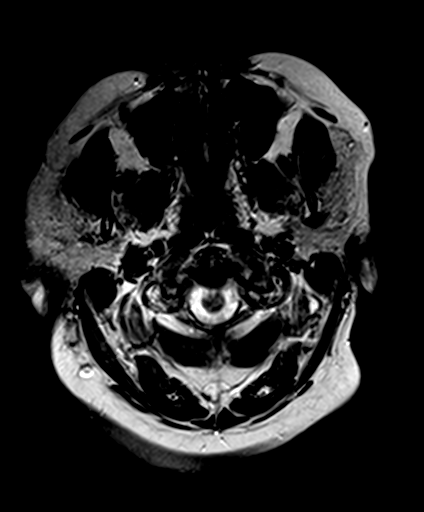

[Series 5: DWI · axial · 3.0mm · 0.94mm/px · z∈[-52,+83]mm · 5 of 96 slices shown (1 of 2)]
[im 1/96]
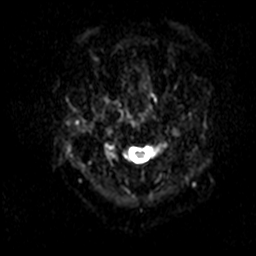
[im 24/96]
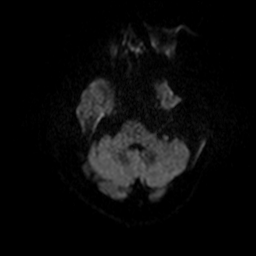
[im 48/96]
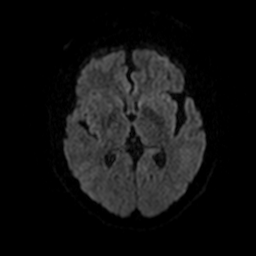
[im 72/96]
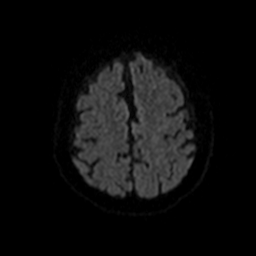
[im 96/96]
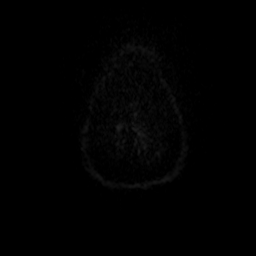

[Series 6: dwi_adc · axial · 3.0mm · 0.94mm/px · z∈[-52,+83]mm · 2 of 48 slices shown]
[im 1/48]
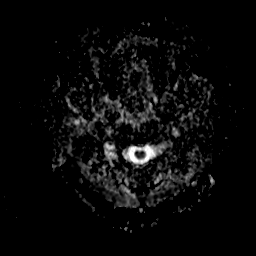
[im 48/48]
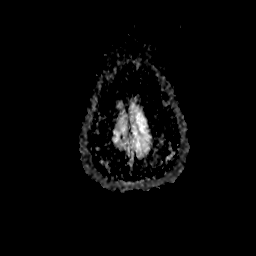

[Series 7: DWI · coronal · 5.0mm · 0.90mm/px · 3 of 64 slices shown (2 of 2)]
[im 1/64]
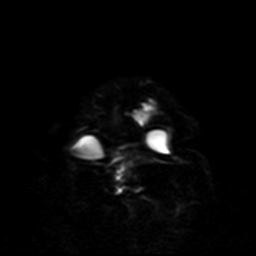
[im 32/64]
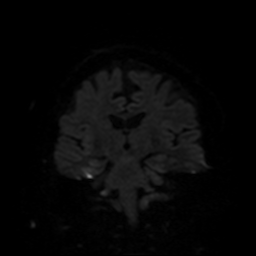
[im 64/64]
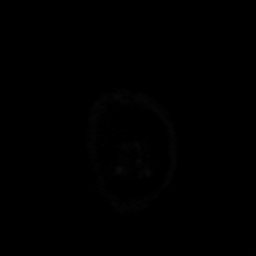

[Series 8: cor dwi_adc · coronal · 5.0mm · 0.90mm/px · 2 of 32 slices shown]
[im 1/32]
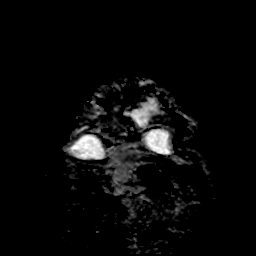
[im 32/32]
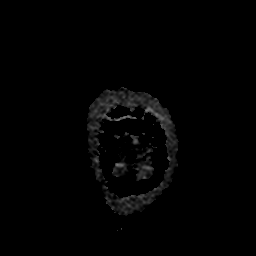

[Series 9: FLAIR · axial · 5.0mm · 0.45mm/px · 1 of 22 slices shown]
[im 1/22]
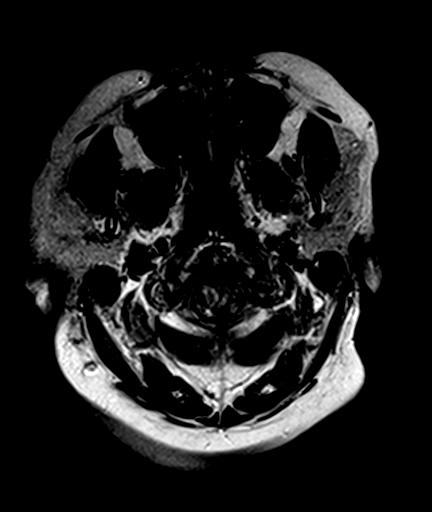

[Series 10: axial (person_name)1 volume · axial · 2.0mm · 0.45mm/px · z∈[-62,-30]mm · 2 of 72 slices shown]
[im 1/72]
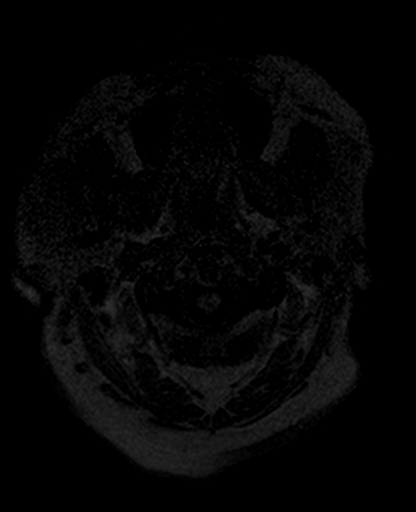
[im 18/72]
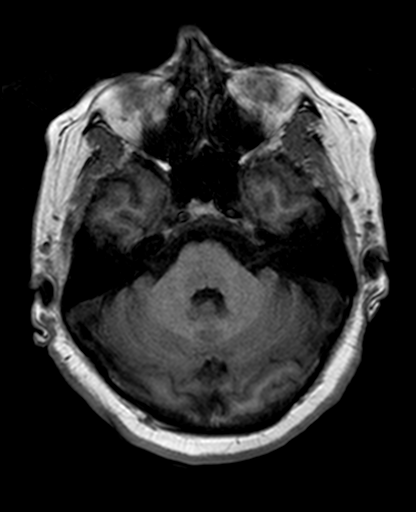

[Series 14: T2 · sagittal · 3.3mm · 0.41mm/px · 1 of 12 slices shown (2 of 5)]
[im 1/12]
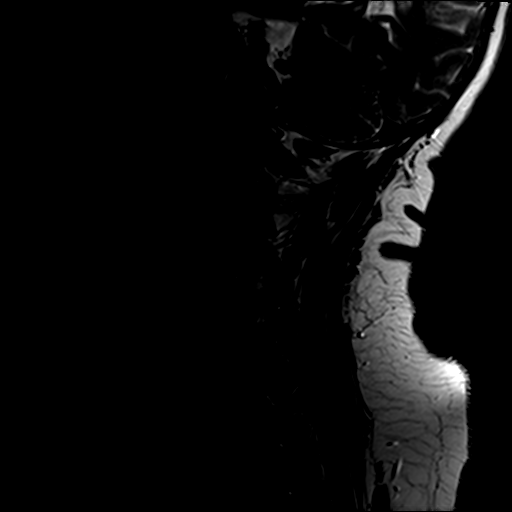

[Series 15: T1 · sagittal · 3.3mm · 0.41mm/px · 1 of 12 slices shown (2 of 4)]
[im 1/12]
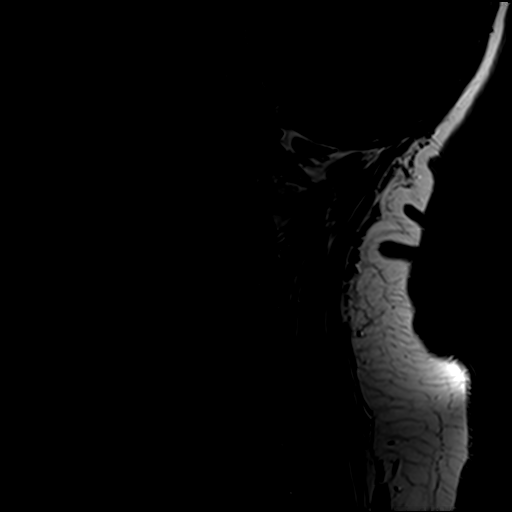

[Series 16: STIR · sagittal · 3.3mm · 0.82mm/px · 1 of 12 slices shown]
[im 1/12]
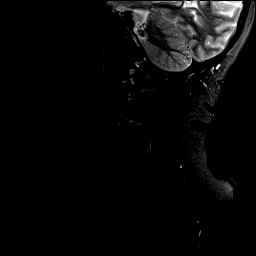

[Series 18: T2 · axial · 3.0mm · 0.70mm/px · z∈[-231,-150]mm · 2 of 24 slices shown (3 of 5)]
[im 1/24]
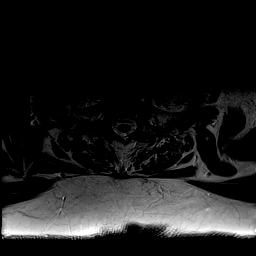
[im 24/24]
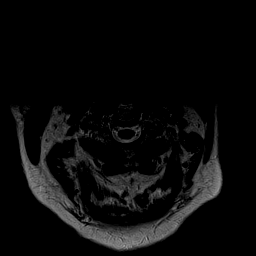

[Series 19: T1 · axial · 3.0mm · 0.70mm/px · z∈[-231,-150]mm · 2 of 24 slices shown (3 of 4)]
[im 1/24]
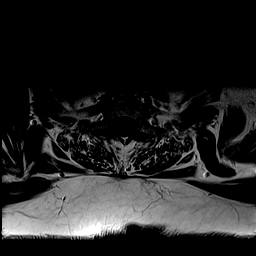
[im 24/24]
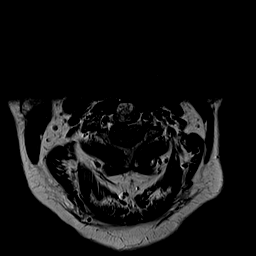

[Series 21: T2 · coronal · 3.0mm · 0.39mm/px · 1 of 20 slices shown (4 of 5)]
[im 1/20]
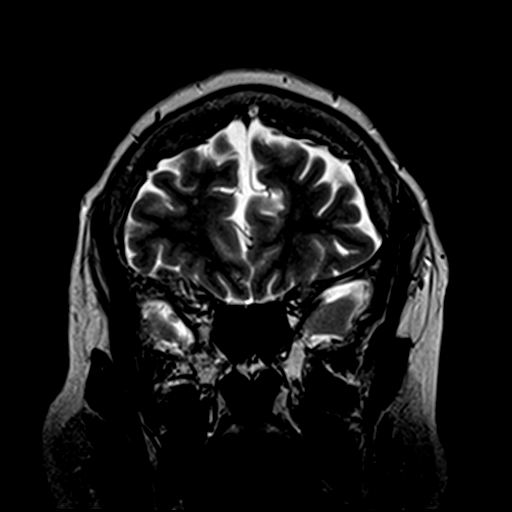

[Series 22: T2 · coronal · 5.0mm · 0.45mm/px · 2 of 24 slices shown (5 of 5)]
[im 1/24]
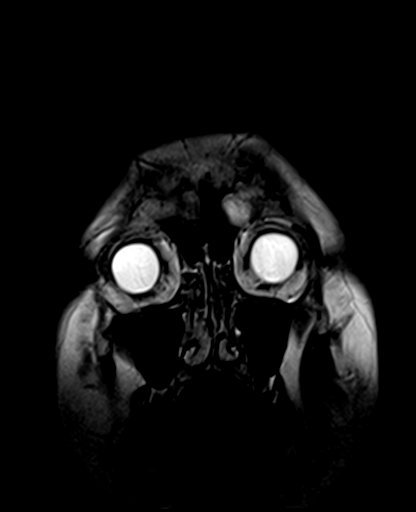
[im 24/24]
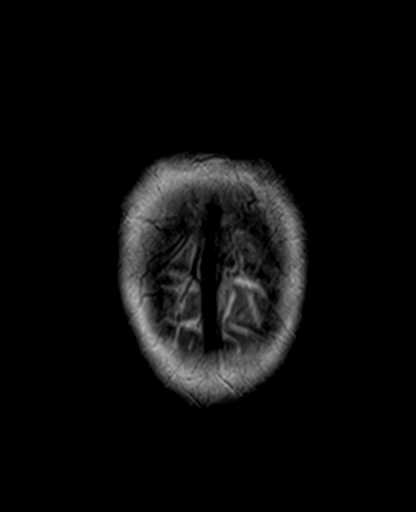

[Series 25: T1 post-contrast · sagittal · 3.3mm · 0.41mm/px · 1 of 12 slices shown]
[im 1/12]
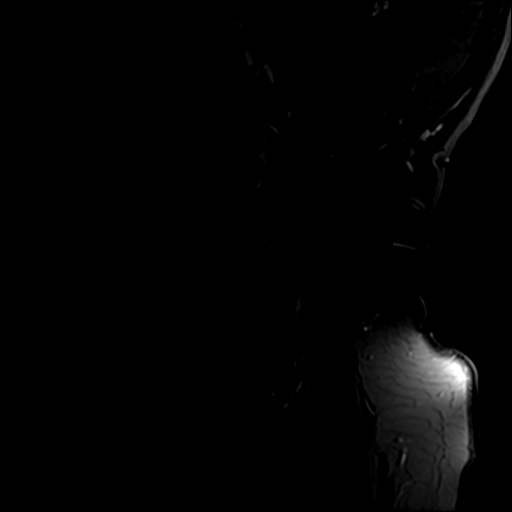

[Series 26: T1 · axial · 3.0mm · 0.70mm/px · z∈[-231,-150]mm · 2 of 24 slices shown (4 of 4)]
[im 1/24]
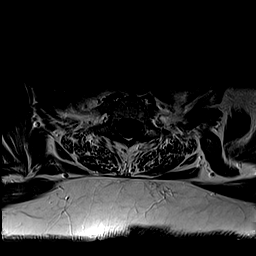
[im 24/24]
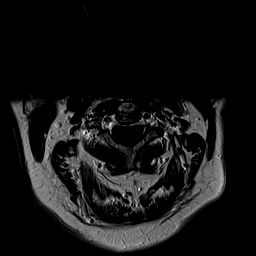

[30 of 48 positions shown; findings below may reference images not displayed]

FINDINGS: MRI HEAD FINDINGS

Some of the sequences are motion degraded.

No acute infarct.

No intracranial hemorrhage.

Slight progression of moderate nonspecific white matter type changes
most notable periventricular region. In a diabetic hypertensive
patient of this age, findings may reflect result of small vessel
disease. With history multiple sclerosis, demyelinating process is
also consideration although appearance is not specific for such.
None of these areas demonstrate enhancement or restricted motion as
can be seen with areas of active demyelination. None of these areas
demonstrate mass effect as can be seen with progressive multifocal
leukoencephalopathy.

No intracranial mass or abnormal enhancement.

Hyperostosis frontalis interna incidentally noted.

Mild global atrophy most notable frontal -temporal lobes without
hydrocephalus.

Major intracranial vascular structures are patent.

Cervical medullary junction, pituitary region, pineal region and
orbital structures unremarkable. Post lens replacement. No evidence
of mesial temporal sclerosis.

MRI CERVICAL SPINE FINDINGS

Some of the sequences are motion degraded.

Cervical medullary junction unremarkable.

Mild motion artifact extends through the cervical cord without
definitive focal cord signal abnormality. No abnormal enhancement.

Mild kyphosis cervical spine centered at C5 level. Left C4 facet
slightly anteriorly located. This appears at most minimally
different from the 08/27/2013 examination. If there were any
suspicion of instability, flexion/ extension views can be obtained
for further delineation. There is no evidence of bony edema soft
tissue edema to suggest acute osseous or soft tissue injury.

C2-3:  Negative.

C3-4: Shallow disc osteophyte greater right paracentral position.
Slight narrowing ventral aspect of the thecal sac without cord
compression.

C4-5: Shallow disc osteophyte complex slightly greater to right.
Mild narrowing ventral aspect of the thecal sac.

C5-6: Broad-based disc osteophyte complex greatest extension left
paracentral position. Narrowing ventral aspect of the thecal sac
greater on left with mild left-sided cord flattening. Left uncinate
hypertrophy. Moderate left foraminal narrowing.

C6-7: Broad-based disc osteophyte complex slightly greater to left.
Narrowing ventral aspect of the thecal sac without cord compression.
Mild foraminal narrowing greater on the left.

C7-T1: Mild posterior element hypertrophy greater on the left.
Slight impression dorsal aspect of the thecal sac greater on the
left.
IMPRESSION: MRI HEAD FINDINGS

Some of the sequences are motion degraded.

Slight progression of moderate nonspecific white matter type changes
most notable periventricular region. In a diabetic hypertensive
patient of this age, findings may reflect result of small vessel
disease. With history multiple sclerosis, demyelinating process is
also consideration although appearance is not specific for such.

No acute infarct or intracranial hemorrhage.

No intracranial mass or abnormal enhancement.

Mild global atrophy.

MRI CERVICAL SPINE

Some of the sequences are motion degraded.

Mild motion artifact extends through the cervical cord without
definitive focal cord signal abnormality. No abnormal enhancement.

Mild kyphosis cervical spine centered at C5 level. Left C4 facet
slightly anteriorly located. This appears at most minimally
different from the 08/27/2013 examination. If there were any
suspicion of instability, flexion/ extension views can be obtained
for further delineation. There is no evidence of bony edema soft
tissue edema to suggest acute osseous or soft tissue injury.

Cervical spondylotic changes most notable C5-6 level. Summary of
pertinent findings includes:

C3-4 shallow disc osteophyte greater right paracentral position.
Slight narrowing ventral aspect of the thecal sac without cord
compression.

C4-5 shallow disc osteophyte complex slightly greater to right. Mild
narrowing ventral aspect of the thecal sac.

C5-6 broad-based disc osteophyte complex greatest extension left
paracentral position. Narrowing ventral aspect of the thecal sac
greater on left with mild left-sided cord flattening. Left uncinate
hypertrophy. Moderate left foraminal narrowing.

C6-7 broad-based disc osteophyte complex slightly greater to left.
Narrowing ventral aspect of the thecal sac without cord compression.
Mild foraminal narrowing greater on the left.

C7-T1 mild posterior element hypertrophy greater on the left. Slight
impression dorsal aspect of the thecal sac greater on the left.

## 2017-07-07 ENCOUNTER — Ambulatory Visit (INDEPENDENT_AMBULATORY_CARE_PROVIDER_SITE_OTHER): Payer: PPO | Admitting: Internal Medicine

## 2017-07-07 ENCOUNTER — Encounter: Payer: Self-pay | Admitting: Internal Medicine

## 2017-07-07 VITALS — BP 110/70 | HR 72 | Temp 98.0°F | Resp 16 | Ht 63.0 in | Wt 138.2 lb

## 2017-07-07 DIAGNOSIS — J0101 Acute recurrent maxillary sinusitis: Secondary | ICD-10-CM

## 2017-07-07 DIAGNOSIS — J301 Allergic rhinitis due to pollen: Secondary | ICD-10-CM

## 2017-07-07 DIAGNOSIS — H6993 Unspecified Eustachian tube disorder, bilateral: Secondary | ICD-10-CM

## 2017-07-07 DIAGNOSIS — H6983 Other specified disorders of Eustachian tube, bilateral: Secondary | ICD-10-CM

## 2017-07-07 DIAGNOSIS — R42 Dizziness and giddiness: Secondary | ICD-10-CM

## 2017-07-07 MED ORDER — METHYLPREDNISOLONE 4 MG PO TBPK: ORAL_TABLET | ORAL | 0 refills | Status: AC

## 2017-07-07 MED ORDER — AMOXICILLIN-POT CLAVULANATE 875-125 MG PO TABS: 1.0000 | ORAL_TABLET | Freq: Two times a day (BID) | ORAL | 0 refills | Status: AC

## 2017-07-07 MED ORDER — MECLIZINE HCL 12.5 MG PO TABS: 12.5000 mg | ORAL_TABLET | Freq: Three times a day (TID) | ORAL | 1 refills | Status: DC | PRN

## 2017-07-07 NOTE — Patient Instructions (Signed)

## 2017-07-07 NOTE — Progress Notes (Signed)
Subjective:  Patient ID: Brandi Ramsey, female    DOB: 05-Sep-1950  Age: 66 y.o. MRN: [id number]  CC: Sinusitis   HPI Brandi Ramsey presents for a several week history of dizziness, vertigo, postnasal drip, thick nasal yellow phlegm, and cough productive of yellow phlegm.  She saw someone else a few weeks ago and was prescribed Zithromax but this is not helped.  She is also had a few episodes of fever but she denies chills.  The vertigo is reminiscent of symptoms she has had before and she responded well to meclizine.  She also complains of popping and pain in both ears.  Outpatient Medications Prior to Visit  Medication Sig Dispense Refill  . aspirin EC 81 MG EC tablet Take 1 tablet (81 mg total) by mouth daily.    [name] atorvastatin (LIPITOR) 80 MG tablet Take 1 tablet (80 mg total) by mouth daily at 6 PM. 90 tablet 1  . Calcium-Vitamin D 600-200 MG-UNIT per tablet Take 1 tablet by mouth 2 (two) times daily.    . clonazePAM (KLONOPIN) 1 MG tablet TAKE 1 TABLET TWICE A DAY AS NEEDED FOR ANXIETY 45 tablet 2  . DULoxetine (CYMBALTA) 30 MG capsule Take 1 capsule (30 mg total) by mouth daily. 90 capsule 1  . pantoprazole (PROTONIX) 40 MG tablet TAKE 1 TABLET TWICE A DAY 60 tablet 5  . PHENobarbital (LUMINAL) 97.2 MG tablet TAKE 1 TABLET AT BEDTIME 30 tablet 2  . ranitidine (ZANTAC) 150 MG tablet Take 150 mg by mouth 2 (two) times daily as needed (for indigestion).     . Vitamin D, Ergocalciferol, (DRISDOL) 50000 UNITS CAPS capsule Take 50,000 Units by mouth once a week. Reported on 08/24/2015    . azithromycin (ZITHROMAX) 250 MG tablet 2 tabs po qd x 1 day; 1 tablet per day x 4 days; 6 tablet 0   No facility-administered medications prior to visit.     ROS Review of Systems  Constitutional: Positive for fever. Negative for appetite change, chills and fatigue.  HENT: Positive for congestion, ear pain, postnasal drip, rhinorrhea and sinus pressure. Negative for facial swelling, hearing loss, sinus  pain, sore throat, tinnitus, trouble swallowing and voice change.   Eyes: Negative.   Respiratory: Positive for cough. Negative for chest tightness, shortness of breath and wheezing.   Cardiovascular: Negative for chest pain, palpitations and leg swelling.  Gastrointestinal: Negative for abdominal pain, constipation, diarrhea and vomiting.  Endocrine: Negative.   Genitourinary: Negative.  Negative for difficulty urinating.  Musculoskeletal: Negative.  Negative for back pain and neck pain.  Skin: Negative.   Allergic/Immunologic: Negative.   Neurological: Positive for dizziness. Negative for weakness, light-headedness and headaches.  Hematological: Negative for adenopathy. Does not bruise/bleed easily.  Psychiatric/Behavioral: Negative.     Objective:  BP 110/70 (BP Location: Left Arm, Patient Position: Sitting, Cuff Size: Normal)   Pulse 72   Temp 98 F (36.7 C) (Oral)   Resp 16   Ht 5\' 3"  (1.6 m)   Wt 138 lb 4 oz (62.7 kg)   SpO2 98%   BMI 24.49 kg/m   BP Readings from Last 3 Encounters:  07/07/17 110/70  06/19/17 122/70  06/18/17 100/84    Wt Readings from Last 3 Encounters:  07/07/17 138 lb 4 oz (62.7 kg)  06/19/17 137 lb 12.8 oz (62.5 kg)  06/18/17 138 lb (62.6 kg)    Physical Exam  Constitutional: She is oriented to person, place, and time.  HENT:  Right Ear:  Hearing, tympanic membrane, external ear and ear canal normal.  Left Ear: Hearing, tympanic membrane, external ear and ear canal normal.  Nose: Mucosal edema present. No rhinorrhea. No epistaxis. Right sinus exhibits maxillary sinus tenderness. Right sinus exhibits no frontal sinus tenderness. Left sinus exhibits maxillary sinus tenderness. Left sinus exhibits no frontal sinus tenderness.  Mouth/Throat: Oropharynx is clear and moist and mucous membranes are normal. Mucous membranes are not pale and not cyanotic. No uvula swelling. No oropharyngeal exudate, posterior oropharyngeal edema, posterior oropharyngeal  erythema or tonsillar abscesses.  Eyes: Conjunctivae are normal. Left eye exhibits no discharge.  Neck: Normal range of motion. Neck supple. No JVD present. No thyromegaly present.  Cardiovascular: Normal rate, regular rhythm and normal heart sounds. Exam reveals no gallop.  No murmur heard. Pulmonary/Chest: Effort normal and breath sounds normal. No respiratory distress. She has no wheezes. She has no rales.  Abdominal: Soft. Bowel sounds are normal. She exhibits no distension and no mass. There is no tenderness.  Musculoskeletal: Normal range of motion. She exhibits no edema or tenderness.  Lymphadenopathy:    She has no cervical adenopathy.  Neurological: She is alert and oriented to person, place, and time.  Skin: Skin is warm and dry. No rash noted. She is not diaphoretic. No erythema. No pallor.  Vitals reviewed.   Lab Results  Component Value Date   WBC 6.9 02/24/2017   HGB 13.4 02/24/2017   HCT 41.0 02/24/2017   PLT 235.0 02/24/2017   GLUCOSE 101 (H) 02/24/2017   CHOL 172 11/18/2016   TRIG 146.0 11/18/2016   HDL 40.40 11/18/2016   LDLCALC 102 (H) 11/18/2016   ALT 20 02/24/2017   AST 23 02/24/2017   NA 138 02/24/2017   K 4.5 02/24/2017   CL 102 02/24/2017   CREATININE 0.81 02/24/2017   BUN 15 02/24/2017   CO2 29 02/24/2017   TSH 0.76 02/24/2017   HGBA1C 5.6 02/24/2017   MICROALBUR 1.4 09/05/2015    Mm Digital Screening Bilateral  Result Date: 03/19/2017 CLINICAL DATA:  Screening. EXAM: DIGITAL SCREENING BILATERAL MAMMOGRAM WITH CAD COMPARISON:  None. ACR Breast Density Category a: The breast tissue is almost entirely fatty. FINDINGS: There are no findings suspicious for malignancy. Images were processed with CAD. IMPRESSION: No mammographic evidence of malignancy. A result letter of this screening mammogram will be mailed directly to the patient. RECOMMENDATION: Screening mammogram in one year. (Code:SM-B-01Y) BI-RADS CATEGORY  1: Negative. Electronically Signed   By:  Ammie Ferrier M.D.   On: 03/19/2017 16:52    Assessment & Plan:   Brandi Ramsey was seen today for sinusitis.  Diagnoses and all orders for this visit:  Vertigo -     meclizine (ANTIVERT) 12.5 MG tablet; Take 1 tablet (12.5 mg total) by mouth 3 (three) times daily as needed for dizziness.  Acute recurrent maxillary sinusitis -     amoxicillin-clavulanate (AUGMENTIN) 875-125 MG tablet; Take 1 tablet by mouth 2 (two) times daily for 10 days.  Seasonal allergic rhinitis due to pollen -     methylPREDNISolone (MEDROL DOSEPAK) 4 MG TBPK tablet; TAKE AS DIRECTED  Eustachian tube dysfunction, bilateral -     methylPREDNISolone (MEDROL DOSEPAK) 4 MG TBPK tablet; TAKE AS DIRECTED   I have discontinued [name]. Nierman's azithromycin. I am also having her start on methylPREDNISolone, amoxicillin-clavulanate, and meclizine. Additionally, I am having her maintain her Vitamin D (Ergocalciferol), Calcium-Vitamin D, ranitidine, aspirin, pantoprazole, DULoxetine, atorvastatin, PHENobarbital, and clonazePAM.  Meds ordered this encounter  Medications  .  methylPREDNISolone (MEDROL DOSEPAK) 4 MG TBPK tablet    Sig: TAKE AS DIRECTED    Dispense:  21 tablet    Refill:  0  . amoxicillin-clavulanate (AUGMENTIN) 875-125 MG tablet    Sig: Take 1 tablet by mouth 2 (two) times daily for 10 days.    Dispense:  20 tablet    Refill:  0  . meclizine (ANTIVERT) 12.5 MG tablet    Sig: Take 1 tablet (12.5 mg total) by mouth 3 (three) times daily as needed for dizziness.    Dispense:  45 tablet    Refill:  1     Follow-up: Return in about 3 weeks (around 07/28/2017).  Scarlette Calico, MD

## 2017-07-08 DIAGNOSIS — E785 Hyperlipidemia, unspecified: Secondary | ICD-10-CM | POA: Diagnosis not present

## 2017-07-08 DIAGNOSIS — M793 Panniculitis, unspecified: Secondary | ICD-10-CM | POA: Diagnosis not present

## 2017-07-08 DIAGNOSIS — Z9884 Bariatric surgery status: Secondary | ICD-10-CM | POA: Diagnosis not present

## 2017-07-08 DIAGNOSIS — K219 Gastro-esophageal reflux disease without esophagitis: Secondary | ICD-10-CM | POA: Diagnosis not present

## 2017-07-24 ENCOUNTER — Ambulatory Visit: Payer: Self-pay

## 2017-07-24 ENCOUNTER — Encounter: Payer: Self-pay | Admitting: Family Medicine

## 2017-07-24 ENCOUNTER — Ambulatory Visit (INDEPENDENT_AMBULATORY_CARE_PROVIDER_SITE_OTHER): Payer: PPO

## 2017-07-24 ENCOUNTER — Ambulatory Visit (INDEPENDENT_AMBULATORY_CARE_PROVIDER_SITE_OTHER): Payer: PPO | Admitting: Family Medicine

## 2017-07-24 VITALS — BP 120/70 | HR 85 | Temp 97.4°F | Ht 63.0 in | Wt 134.0 lb

## 2017-07-24 DIAGNOSIS — Z8709 Personal history of other diseases of the respiratory system: Secondary | ICD-10-CM

## 2017-07-24 DIAGNOSIS — J4531 Mild persistent asthma with (acute) exacerbation: Secondary | ICD-10-CM

## 2017-07-24 DIAGNOSIS — R05 Cough: Secondary | ICD-10-CM | POA: Diagnosis not present

## 2017-07-24 LAB — CBC
HEMATOCRIT: 38.8 % (ref 35.0–45.0)
Hemoglobin: 13.1 g/dL (ref 11.7–15.5)
MCH: 28.5 pg (ref 27.0–33.0)
MCHC: 33.8 g/dL (ref 32.0–36.0)
MCV: 84.3 fL (ref 80.0–100.0)
MPV: 11.4 fL (ref 7.5–12.5)
Platelets: 210 10*3/uL (ref 140–400)
RBC: 4.6 10*6/uL (ref 3.80–5.10)
RDW: 12.8 % (ref 11.0–15.0)
WBC: 6 10*3/uL (ref 3.8–10.8)

## 2017-07-24 MED ORDER — AMOXICILLIN-POT CLAVULANATE ER 1000-62.5 MG PO TB12: 1.0000 | ORAL_TABLET | Freq: Two times a day (BID) | ORAL | 0 refills | Status: AC

## 2017-07-24 MED ORDER — PREDNISONE 20 MG PO TABS: 20.0000 mg | ORAL_TABLET | Freq: Two times a day (BID) | ORAL | 0 refills | Status: AC

## 2017-07-24 MED ORDER — FLUTICASONE PROPIONATE 50 MCG/ACT NA SUSP: 2.0000 | Freq: Every day | NASAL | 1 refills | Status: DC

## 2017-07-24 NOTE — Telephone Encounter (Signed)
Noted  

## 2017-07-24 NOTE — Telephone Encounter (Signed)
  Reason for Disposition . SEVERE coughing spells (e.g., whooping sound after coughing, vomiting after coughing)  Answer Assessment - Initial Assessment Questions 1. ONSET: "When did the cough begin?"      Got better, but not completely went away 2. SEVERITY: "How bad is the cough today?"      Moderate 3. RESPIRATORY DISTRESS: "Describe your breathing."      No disterss 4. FEVER: "Do you have a fever?" If so, ask: "What is your temperature, how was it measured, and when did it start?"     No 5. SPUTUM: "Describe the color of your sputum" (clear, white, yellow, green)     White and green 6. HEMOPTYSIS: "Are you coughing up any blood?" If so ask: "How much?" (flecks, streaks, tablespoons, etc.)     Flecks 7. CARDIAC HISTORY: "Do you have any history of heart disease?" (e.g., heart attack, congestive heart failure)      No 8. LUNG HISTORY: "Do you have any history of lung disease?"  (e.g., pulmonary embolus, asthma, emphysema)     Asthma 9. PE RISK FACTORS: "Do you have a history of blood clots?" (or: recent major surgery, recent prolonged travel, bedridden )     No 10. OTHER SYMPTOMS: "Do you have any other symptoms?" (e.g., runny nose, wheezing, chest pain)       Sinus congestion 11. PREGNANCY: "Is there any chance you are pregnant?" "When was your last menstrual period?"       No 12. TRAVEL: "Have you traveled out of the country in the last month?" (e.g., travel history, exposures)       No  Protocols used: [place] started feeling better, then " cough and congestion came back." Appointment for today.

## 2017-07-24 NOTE — Progress Notes (Signed)
Subjective:  Patient ID: Brandi Ramsey, female    DOB: 02-07-1951  Age: 67 y.o. MRN: [id number]  CC: Cough and sinus congestion   HPI Brandi Ramsey presents for 1-2 day ho post nasal drip with a dry non productive cough. Reports elevated temp this morning.  Patient has a history of reactive airway disease with upper respiratory tract infection.  She has no asthma history.  She has never smoked.  She was seen back on the 15th and treated with a 10-day course of Augmentin for maxillary sinusitis  And a 5-day Medrol Dosepak for eustachian tube dysfunction.  These medicines seem to help her a great deal but her symptoms never quite cleared.  A few days after stopping she developed drainage again that was clear and lightly colored.    Outpatient Medications Prior to Visit  Medication Sig Dispense Refill  . aspirin EC 81 MG EC tablet Take 1 tablet (81 mg total) by mouth daily.    [name] atorvastatin (LIPITOR) 80 MG tablet Take 1 tablet (80 mg total) by mouth daily at 6 PM. 90 tablet 1  . Calcium-Vitamin D 600-200 MG-UNIT per tablet Take 1 tablet by mouth 2 (two) times daily.    . clonazePAM (KLONOPIN) 1 MG tablet TAKE 1 TABLET TWICE A DAY AS NEEDED FOR ANXIETY 45 tablet 2  . DULoxetine (CYMBALTA) 30 MG capsule Take 1 capsule (30 mg total) by mouth daily. 90 capsule 1  . meclizine (ANTIVERT) 12.5 MG tablet Take 1 tablet (12.5 mg total) by mouth 3 (three) times daily as needed for dizziness. 45 tablet 1  . pantoprazole (PROTONIX) 40 MG tablet TAKE 1 TABLET TWICE A DAY 60 tablet 5  . PHENobarbital (LUMINAL) 97.2 MG tablet TAKE 1 TABLET AT BEDTIME 30 tablet 2  . ranitidine (ZANTAC) 150 MG tablet Take 150 mg by mouth 2 (two) times daily as needed (for indigestion).     . Vitamin D, Ergocalciferol, (DRISDOL) 50000 UNITS CAPS capsule Take 50,000 Units by mouth once a week. Reported on 08/24/2015     No facility-administered medications prior to visit.     ROS Review of Systems  Constitutional: Negative for  chills, fatigue and fever.  HENT: Positive for congestion and postnasal drip. Negative for sinus pressure, sinus pain and sore throat.   Eyes: Negative for photophobia and visual disturbance.  Respiratory: Positive for cough and wheezing. Negative for shortness of breath.   Cardiovascular: Negative.   Gastrointestinal: Negative.   Endocrine: Negative for polyphagia and polyuria.  Musculoskeletal: Negative for arthralgias and myalgias.  Skin: Negative for color change and rash.  Hematological: Does not bruise/bleed easily.  Psychiatric/Behavioral: Negative.     Objective:  BP 120/70 (BP Location: Left Arm, Patient Position: Sitting, Cuff Size: Normal)   Pulse 85   Temp (!) 97.4 F (36.3 C) (Oral)   Ht 5\' 3"  (1.6 m)   Wt 134 lb (60.8 kg)   SpO2 98%   BMI 23.74 kg/m   BP Readings from Last 3 Encounters:  07/24/17 120/70  07/07/17 110/70  06/19/17 122/70    Wt Readings from Last 3 Encounters:  07/24/17 134 lb (60.8 kg)  07/07/17 138 lb 4 oz (62.7 kg)  06/19/17 137 lb 12.8 oz (62.5 kg)    Physical Exam  Constitutional: She is oriented to person, place, and time. She appears well-developed and well-nourished. No distress.  HENT:  Head: Normocephalic and atraumatic.  Right Ear: External ear normal.  Left Ear: External ear normal.  Mouth/Throat:  Oropharynx is clear and moist. No oropharyngeal exudate.  Eyes: Conjunctivae are normal. Pupils are equal, round, and reactive to light. Right eye exhibits no discharge. Left eye exhibits no discharge. No scleral icterus.  Neck: Neck supple. No JVD present. No tracheal deviation present. No thyromegaly present.  Cardiovascular: Normal rate, regular rhythm and normal heart sounds.  Pulmonary/Chest: Effort normal and breath sounds normal. No stridor. No respiratory distress. She has no wheezes. She has no rales.  Abdominal: Bowel sounds are normal.  Lymphadenopathy:    She has no cervical adenopathy.  Neurological: She is alert and  oriented to person, place, and time.  Skin: Skin is warm and dry. She is not diaphoretic.  Psychiatric: She has a normal mood and affect. Her behavior is normal.    Lab Results  Component Value Date   WBC 6.9 02/24/2017   HGB 13.4 02/24/2017   HCT 41.0 02/24/2017   PLT 235.0 02/24/2017   GLUCOSE 101 (H) 02/24/2017   CHOL 172 11/18/2016   TRIG 146.0 11/18/2016   HDL 40.40 11/18/2016   LDLCALC 102 (H) 11/18/2016   ALT 20 02/24/2017   AST 23 02/24/2017   NA 138 02/24/2017   K 4.5 02/24/2017   CL 102 02/24/2017   CREATININE 0.81 02/24/2017   BUN 15 02/24/2017   CO2 29 02/24/2017   TSH 0.76 02/24/2017   HGBA1C 5.6 02/24/2017   MICROALBUR 1.4 09/05/2015    Mm Digital Screening Bilateral  Result Date: 03/19/2017 CLINICAL DATA:  Screening. EXAM: DIGITAL SCREENING BILATERAL MAMMOGRAM WITH CAD COMPARISON:  None. ACR Breast Density Category a: The breast tissue is almost entirely fatty. FINDINGS: There are no findings suspicious for malignancy. Images were processed with CAD. IMPRESSION: No mammographic evidence of malignancy. A result letter of this screening mammogram will be mailed directly to the patient. RECOMMENDATION: Screening mammogram in one year. (Code:SM-B-01Y) BI-RADS CATEGORY  1: Negative. Electronically Signed   By: Ammie Ferrier M.D.   On: 03/19/2017 16:52    Assessment & Plan:   Brandi Ramsey was seen today for cough and sinus congestion.  Diagnoses and all orders for this visit:  Mild persistent reactive airway disease with acute exacerbation -     Cancel: CBC -     DG Chest 2 View; Future -     CBC -     DG Chest 2 View -     predniSONE (DELTASONE) 20 MG tablet; Take 1 tablet (20 mg total) by mouth 2 (two) times daily with a meal for 7 days.  History of bronchitis -     Cancel: CBC -     DG Chest 2 View; Future -     DG Chest 2 View -     amoxicillin-clavulanate (AUGMENTIN XR) 1000-62.5 MG 12 hr tablet; Take 1 tablet by mouth 2 (two) times daily for 10  days.  History of sinusitis -     fluticasone (FLONASE) 50 MCG/ACT nasal spray; Place 2 sprays into both nostrils daily.   I am having Brandi Ramsey start on amoxicillin-clavulanate, predniSONE, and fluticasone. I am also having her maintain her Vitamin D (Ergocalciferol), Calcium-Vitamin D, ranitidine, aspirin, pantoprazole, DULoxetine, atorvastatin, PHENobarbital, clonazePAM, and meclizine.  Meds ordered this encounter  Medications  . amoxicillin-clavulanate (AUGMENTIN XR) 1000-62.5 MG 12 hr tablet    Sig: Take 1 tablet by mouth 2 (two) times daily for 10 days.    Dispense:  20 tablet    Refill:  0  . predniSONE (DELTASONE) 20 MG tablet  Sig: Take 1 tablet (20 mg total) by mouth 2 (two) times daily with a meal for 7 days.    Dispense:  14 tablet    Refill:  0  . fluticasone (FLONASE) 50 MCG/ACT nasal spray    Sig: Place 2 sprays into both nostrils daily.    Dispense:  16 g    Refill:  1   Will try higher dose Augmentin and a longer course of prednisone.  We will add Flonase and I believe this will help this to clear for her.  She will follow-up with Dr. Ronnald Ramp on Tuesday.  Follow-up: No Follow-up on file.  Libby Maw, MD

## 2017-07-28 ENCOUNTER — Encounter: Payer: Self-pay | Admitting: Internal Medicine

## 2017-07-28 ENCOUNTER — Ambulatory Visit (INDEPENDENT_AMBULATORY_CARE_PROVIDER_SITE_OTHER): Payer: PPO | Admitting: Internal Medicine

## 2017-07-28 VITALS — BP 120/80 | HR 71 | Temp 97.8°F | Resp 16 | Ht 63.0 in | Wt 137.2 lb

## 2017-07-28 DIAGNOSIS — J0101 Acute recurrent maxillary sinusitis: Secondary | ICD-10-CM

## 2017-07-28 DIAGNOSIS — I10 Essential (primary) hypertension: Secondary | ICD-10-CM

## 2017-07-28 DIAGNOSIS — R42 Dizziness and giddiness: Secondary | ICD-10-CM | POA: Diagnosis not present

## 2017-07-28 NOTE — Progress Notes (Signed)
Subjective:  Patient ID: Brandi Brandi Ramsey, female    DOB: October 12, 1950  Age: 67 y.o. MRN: [id number]  CC: URI   HPI Brandi Brandi Ramsey presents for follow-up after recent URI/sinus infection.  She is completing the course of Augmentin and tells me that all of her symptoms have improved.  She has mild chronic dizziness and vertigo that is responding well to meclizine but she otherwise feels well and offers no other complaints.  Outpatient Medications Prior to Visit  Medication Sig Dispense Refill  . amoxicillin-clavulanate (AUGMENTIN XR) 1000-62.5 MG 12 hr tablet Take 1 tablet by mouth 2 (two) times daily for 10 days. 20 tablet 0  . aspirin EC 81 MG EC tablet Take 1 tablet (81 mg total) by mouth daily.    [name] atorvastatin (LIPITOR) 80 MG tablet Take 1 tablet (80 mg total) by mouth daily at 6 PM. 90 tablet 1  . Calcium-Vitamin D 600-200 MG-UNIT per tablet Take 1 tablet by mouth 2 (two) times daily.    . clonazePAM (KLONOPIN) 1 MG tablet TAKE 1 TABLET TWICE A DAY AS NEEDED FOR ANXIETY 45 tablet 2  . DULoxetine (CYMBALTA) 30 MG capsule Take 1 capsule (30 mg total) by mouth daily. 90 capsule 1  . fluticasone (FLONASE) 50 MCG/ACT nasal spray Place 2 sprays into both nostrils daily. 16 g 1  . meclizine (ANTIVERT) 12.5 MG tablet Take 1 tablet (12.5 mg total) by mouth 3 (three) times daily as needed for dizziness. 45 tablet 1  . pantoprazole (PROTONIX) 40 MG tablet TAKE 1 TABLET TWICE A DAY 60 tablet 5  . PHENobarbital (LUMINAL) 97.2 MG tablet TAKE 1 TABLET AT BEDTIME 30 tablet 2  . predniSONE (DELTASONE) 20 MG tablet Take 1 tablet (20 mg total) by mouth 2 (two) times daily with a meal for 7 days. 14 tablet 0  . ranitidine (ZANTAC) 150 MG tablet Take 150 mg by mouth 2 (two) times daily as needed (for indigestion).     . Vitamin D, Ergocalciferol, (DRISDOL) 50000 UNITS CAPS capsule Take 50,000 Units by mouth once a week. Reported on 08/24/2015     No facility-administered medications prior to visit.      ROS Review of Systems  Constitutional: Negative.  Negative for chills, diaphoresis, fatigue and fever.  HENT: Negative.  Negative for congestion, facial swelling, sinus pressure and sore throat.   Eyes: Negative for visual disturbance.  Respiratory: Negative for cough, chest tightness, shortness of breath and wheezing.   Cardiovascular: Negative for chest pain, palpitations and leg swelling.  Gastrointestinal: Negative for abdominal pain, constipation, diarrhea, nausea and vomiting.  Endocrine: Negative.   Genitourinary: Negative.  Negative for difficulty urinating.  Musculoskeletal: Negative.  Negative for back pain.  Skin: Negative.   Neurological: Positive for dizziness. Negative for weakness, light-headedness and numbness.  Hematological: Negative for adenopathy. Does not bruise/bleed easily.  Psychiatric/Behavioral: Negative.     Objective:  BP 120/80 (BP Location: Left Arm, Patient Position: Sitting, Cuff Size: Normal)   Pulse 71   Temp 97.8 F (36.6 C) (Oral)   Resp 16   Ht 5\' 3"  (1.6 m)   Wt 137 lb 4 oz (62.3 kg)   SpO2 99%   BMI 24.31 kg/m   BP Readings from Last 3 Encounters:  07/28/17 120/80  07/24/17 120/70  07/07/17 110/70    Wt Readings from Last 3 Encounters:  07/28/17 137 lb 4 oz (62.3 kg)  07/24/17 134 lb (60.8 kg)  07/07/17 138 lb 4 oz (62.7 kg)  Physical Exam  Constitutional: She is oriented to person, place, and time. No distress.  HENT:  Mouth/Throat: Oropharynx is clear and moist. No oropharyngeal exudate.  Eyes: Conjunctivae are normal. Left eye exhibits no discharge. No scleral icterus.  Neck: Normal range of motion. Neck supple. No JVD present. No thyromegaly present.  Cardiovascular: Normal rate, regular rhythm and normal heart sounds. Exam reveals no gallop.  No murmur heard. Pulmonary/Chest: Breath sounds normal. No respiratory distress. She has no wheezes. She has no rales.  Abdominal: Soft. Bowel sounds are normal. She exhibits  no distension and no mass. There is no tenderness.  Musculoskeletal: Normal range of motion. She exhibits no edema or tenderness.  Lymphadenopathy:    She has no cervical adenopathy.  Neurological: She is alert and oriented to person, place, and time.  Skin: Skin is warm and dry. No rash noted. She is not diaphoretic. No erythema. No pallor.  Vitals reviewed.   Lab Results  Component Value Date   WBC 6.0 07/24/2017   HGB 13.1 07/24/2017   HCT 38.8 07/24/2017   PLT 210 07/24/2017   GLUCOSE 101 (H) 02/24/2017   CHOL 172 11/18/2016   TRIG 146.0 11/18/2016   HDL 40.40 11/18/2016   LDLCALC 102 (H) 11/18/2016   ALT 20 02/24/2017   AST 23 02/24/2017   NA 138 02/24/2017   K 4.5 02/24/2017   CL 102 02/24/2017   CREATININE 0.81 02/24/2017   BUN 15 02/24/2017   CO2 29 02/24/2017   TSH 0.76 02/24/2017   HGBA1C 5.6 02/24/2017   MICROALBUR 1.4 09/05/2015    Mm Digital Screening Bilateral  Result Date: 03/19/2017 CLINICAL DATA:  Screening. EXAM: DIGITAL SCREENING BILATERAL MAMMOGRAM WITH CAD COMPARISON:  None. ACR Breast Density Category a: The breast tissue is almost entirely fatty. FINDINGS: There are no findings suspicious for malignancy. Images were processed with CAD. IMPRESSION: No mammographic evidence of malignancy. A result letter of this screening mammogram will be mailed directly to the patient. RECOMMENDATION: Screening mammogram in one year. (Code:SM-B-01Y) BI-RADS CATEGORY  1: Negative. Electronically Signed   By: Ammie Ferrier M.D.   On: 03/19/2017 16:52    Assessment & Plan:   [name] was seen today for uri.  Diagnoses and all orders for this visit:  Acute recurrent maxillary sinusitis- She has had a good response to Augmentin.  I encouraged her to complete the course.  Essential (primary) hypertension- Her blood pressure is adequately well controlled.  Vertigo- She has mild dizziness but no other neurological complaints.  She is getting adequate symptom relief with  meclizine.   I am having Brandi Brandi Ramsey. Brandi Ramsey maintain her Vitamin D (Ergocalciferol), Calcium-Vitamin D, ranitidine, aspirin, pantoprazole, DULoxetine, atorvastatin, PHENobarbital, clonazePAM, meclizine, amoxicillin-clavulanate, predniSONE, and fluticasone.  No orders of the defined types were placed in this encounter.    Follow-up: Return if symptoms worsen or fail to improve.  Scarlette Calico, MD

## 2017-07-28 NOTE — Patient Instructions (Signed)

## 2017-08-01 ENCOUNTER — Other Ambulatory Visit: Payer: Self-pay | Admitting: Internal Medicine

## 2017-09-06 ENCOUNTER — Other Ambulatory Visit: Payer: Self-pay | Admitting: Internal Medicine

## 2017-09-27 ENCOUNTER — Other Ambulatory Visit: Payer: Self-pay | Admitting: Internal Medicine

## 2017-09-27 DIAGNOSIS — F320 Major depressive disorder, single episode, mild: Secondary | ICD-10-CM

## 2017-10-02 ENCOUNTER — Other Ambulatory Visit: Payer: Self-pay | Admitting: Family Medicine

## 2017-10-02 DIAGNOSIS — Z8709 Personal history of other diseases of the respiratory system: Secondary | ICD-10-CM

## 2017-10-05 ENCOUNTER — Other Ambulatory Visit: Payer: Self-pay | Admitting: Internal Medicine

## 2017-10-05 DIAGNOSIS — F411 Generalized anxiety disorder: Secondary | ICD-10-CM

## 2017-10-05 DIAGNOSIS — R42 Dizziness and giddiness: Secondary | ICD-10-CM

## 2017-10-16 ENCOUNTER — Encounter: Payer: Self-pay | Admitting: Neurology

## 2017-10-16 ENCOUNTER — Ambulatory Visit (INDEPENDENT_AMBULATORY_CARE_PROVIDER_SITE_OTHER): Payer: PPO | Admitting: Neurology

## 2017-10-16 VITALS — BP 90/70 | HR 83 | Ht 63.5 in | Wt 135.0 lb

## 2017-10-16 DIAGNOSIS — G43909 Migraine, unspecified, not intractable, without status migrainosus: Secondary | ICD-10-CM

## 2017-10-16 DIAGNOSIS — G40309 Generalized idiopathic epilepsy and epileptic syndromes, not intractable, without status epilepticus: Secondary | ICD-10-CM

## 2017-10-16 DIAGNOSIS — G250 Essential tremor: Secondary | ICD-10-CM

## 2017-10-16 DIAGNOSIS — Q992 Fragile X chromosome: Secondary | ICD-10-CM | POA: Diagnosis not present

## 2017-10-16 DIAGNOSIS — G119 Hereditary ataxia, unspecified: Secondary | ICD-10-CM | POA: Diagnosis not present

## 2017-10-16 NOTE — Progress Notes (Signed)
NEUROLOGY FOLLOW UP OFFICE NOTE  Brandi Ramsey [id number]  HISTORY OF PRESENT ILLNESS: Brandi Ramsey is a 67 year old woman with hypercholesterolemia, type II diabetes mellitus, remote seizure disorder, migraine, depression, and history of TIA who follows up for fragile X tremor-associated syndrome, tension-type headache, and history of seizures and concussion.      UPDATE: Headaches are controlled. She reports decreased hearing, right worse than left and worsening vision.   No seizures.  Hepatic panel from September was normal.   HISTORY: Her son has fragile X syndrome with severe autism.  About 22 years ago, she began experiencing dizzy spells.  She apparently had imaging of the head which was suspicious for possible multiple sclerosis.  She had lumbar puncture, which was negative.  About 10 years ago, she developed mild tremor and unsteadiness.  She was worked up by Dr. Hazle Nordmann, a fragile X specialist at [facility], who diagnosed her with fragile X-associated tremor ataxia syndrome.  Work up included genetic testing for FMR1 gene premutation.    She has had a progressive course over the years.  She will have dizzy spells, described as lightheadedness, which will cause falls.  She has fractured her wrists and leg.  She also has tremor of the hands, causing difficulty with writing.  She also developed dysphonia and was found to have tremor involving her "voice box".  She reports prior episodes of both bowel and bladder incontinence, which are rare.  She also reports poor depth perception.  For example, she may sometimes drive too close to the curb or she may accidentally bump into somebody when walking through the grocery store aisle.  She reports increased fatigue and weight gain.  She has depression and mood swings.  She also reports short-term memory problems.  She also has problems with swallowing and has choked on some occasions, requiring the Heimlich maneuver.   She has a remote  history of epilepsy.  She had convulsions as a baby and two generalized tonic-clonic seizures in her early twenties.  She has been on phenobarbital ever since.  No recurrent seizures.   A DEXA scan was performed in 2016 and was negative.  She continues on phenobarbital 97.5mg  at bedtime.   She also has history of migraine, described as severe pressure and throbbing, over her left eye.  In March 2015, she had such a headache associated with right sided facial numbness and lower facial weakness.  She was admitted to [facility] for TIA.  CT of the head was unremarkable.  MRI of the brain showed periventricular and subcortical hyperintensities.  MRA of the head showed no major intracranial arterial stenosis, however there is question of mild narrowing of the left cavernous ICA.  Carotid duplex showed 1-39% bilateral ICA stenosis.  2D echo showed LVEF of 60-65% with moderate LVH.     She suffered postconcussion syndrome since slipping and hitting the back of her head in August 2016.  She says she passed afterwards for a few seconds.  Afterwards, she noted headache, spinning sensation, nausea and she vomited.  She went to the ED where CT of the head and cervical spine showed no acute findings.  She was discharged home with meclizine and hydrocodone.  Since she has been home, she still notes constant dull posterior headache.  She takes ibuprofen for it daily but not the hydrocodone.  The dizziness is improving.  She has been more emotional and was crying easily.  She is seeing Dr. Hulan Saas of  Sports Medicine for management.  Due to concerns of possible prior history of MS, she underwent MRI of the brain and cervical spine with and without contrast on 03/15/15.  There was slight progression of nonspecific cerebral white matter changes, most probably chronic small vessel disease, compared to 2012.  Cervical spine showed no cord lesions.     Past medications:  venlafaxine  PAST MEDICAL HISTORY: Past Medical  History:  Diagnosis Date  . Anxiety   . Arthritis   . Cerebellar ataxia ([place])   . Colon polyps   . Depression   . Diabetes mellitus without complication ([place])   . Fibrocystic breast   . Fibromyalgia   . 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 Gastric ulcer   . Hemorrhoids   . History of Doppler ultrasound    a. Carotid US 3/15: mild soft plaque origin ICA. 1-39% ICA stenosis.  [name] History of echocardiogram    a. Echo 3/15: Moderate LVH, focal basal hypertrophy, EF 60-65%, normal wall motion  . History of nuclear stress test    a. Myoview 5/16: Normal perfusion, EF 78%, low risk  . Hyperlipidemia   . Hypertension   . Obesity   . Pneumonia   . Seizures ([place])   . Stroke ([place])   . Transient cerebral ischemia   . Vertigo, benign paroxysmal     MEDICATIONS: Current Outpatient Medications on File Prior to Visit  Medication Sig Dispense Refill  . aspirin EC 81 MG EC tablet Take 1 tablet (81 mg total) by mouth daily.    [name] atorvastatin (LIPITOR) 80 MG tablet Take 1 tablet (80 mg total) by mouth daily at 6 PM. 90 tablet 1  . Calcium-Vitamin D 600-200 MG-UNIT per tablet Take 1 tablet by mouth 2 (two) times daily.    . clonazePAM (KLONOPIN) 1 MG tablet TAKE 1 TABLET TWICE A DAY AS NEEDED FOR ANXIETY 45 tablet 2  . DULoxetine (CYMBALTA) 30 MG capsule TAKE 1 CAPSULE BY MOUTH EVERY DAY 90 capsule 1  . fluticasone (FLONASE) 50 MCG/ACT nasal spray SPRAY 2 SPRAYS INTO EACH NOSTRIL EVERY DAY 16 g 1  . meclizine (ANTIVERT) 12.5 MG tablet TAKE 1 TABLET (12.5 MG TOTAL) BY MOUTH 3 (THREE) TIMES DAILY AS NEEDED FOR DIZZINESS. 45 tablet 1  . pantoprazole (PROTONIX) 40 MG tablet TAKE 1 TABLET TWICE A DAY 60 tablet 5  . PHENobarbital (LUMINAL) 97.2 MG tablet TAKE 1 TABLET BY MOUTH EVERYDAY AT BEDTIME 30 tablet 2  . ranitidine (ZANTAC) 150 MG tablet Take 150 mg by mouth 2 (two) times daily as needed (for indigestion).     . Vitamin D, Ergocalciferol, (DRISDOL) 50000 UNITS CAPS capsule Take  50,000 Units by mouth once a week. Reported on 08/24/2015     No current facility-administered medications on file prior to visit.     ALLERGIES: Allergies  Allergen Reactions  . Buprenorphine Hcl Hives and Itching  . Hydromorphone     Could not focus, "Seeing scary things."  Noises louder. Altered mental status  . Meperidine Hives and Itching    Other reaction(s): HIVES,ITCHING  . Metformin And Related Diarrhea  . Morphine And Related Hives and Itching    States only when mixed with Demerol  . Pentazocine Anaphylaxis  . Pentazocine Lactate Anaphylaxis  . Phenytoin Hives    Other reaction(s): RASH  . Demerol Hives and Itching  . Dilaudid [Hydromorphone Hcl]     Hallucinations and felt crazy   . Doxycycline Hyclate  Other reaction(s): GI UPSET,NAUSEA,VOMITING  . Ibuprofen     Contraindicated for risk of GI bleeding.  [name] Phenytoin Sodium Extended Hives  . Codeine Rash and Nausea Only    When mixed with Demerol Patient reports no reaction with Codeine/APAP (Tylenol #3)  . Doxycycline Hyclate Nausea And Vomiting    Other reaction(s): GI UPSET,NAUSEA,VOMITING Other reaction(s): GI UPSET,NAUSEA,VOMITING    FAMILY HISTORY: Family History  Problem Relation Age of Onset  . Raynaud syndrome Mother   . Colon polyps Mother   . Heart attack Mother   . Alzheimer's disease Father   . Diabetes Father   . Colon polyps Father   . Fragile X syndrome Son        Patient is carrier  . Diabetes Sister   . Diabetes Brother   . Cervical cancer Maternal Grandmother   . Breast cancer Maternal Grandmother   . Ovarian cancer Maternal Grandmother   . Heart failure Maternal Grandfather   . Diabetes Paternal Grandmother   . Colon polyps Sister   . Colon polyps Brother   . Colon cancer Maternal Aunt     SOCIAL HISTORY: Social History   Socioeconomic History  . Marital status: Married    Spouse name: Not on file  . Number of children: Not on file  . Years of education: Not on file    . Highest education level: Not on file  Occupational History  . Not on file  Social Needs  . Financial resource strain: Not on file  . Food insecurity:    Worry: Not on file    Inability: Not on file  . Transportation needs:    Medical: Not on file    Non-medical: Not on file  Tobacco Use  . Smoking status: Never Smoker  . Smokeless tobacco: Never Used  Substance and Sexual Activity  . Alcohol use: No    Alcohol/week: 0.0 oz  . Drug use: No  . Sexual activity: Yes    Partners: Male    Comment: 1st intercourse 67 yo-Fewer than 5 partners  Lifestyle  . Physical activity:    Days per week: Not on file    Minutes per session: Not on file  . Stress: Not on file  Relationships  . Social connections:    Talks on phone: Not on file    Gets together: Not on file    Attends religious service: Not on file    Active member of club or organization: Not on file    Attends meetings of clubs or organizations: Not on file    Relationship status: Not on file  . Intimate partner violence:    Fear of current or ex partner: Not on file    Emotionally abused: Not on file    Physically abused: Not on file    Forced sexual activity: Not on file  Other Topics Concern  . 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   Son with special needs   Married    REVIEW OF SYSTEMS: Constitutional: No fevers, chills, or sweats, no generalized fatigue, change in appetite Eyes: No visual changes, double vision, eye pain Ear, nose and throat: No hearing loss, ear pain, nasal congestion, sore throat Cardiovascular: No chest pain, palpitations Respiratory:  No shortness of breath at rest or with exertion, wheezes GastrointestinaI: No nausea, vomiting, diarrhea, abdominal pain, fecal incontinence Genitourinary:  No dysuria, urinary retention or frequency Musculoskeletal:  No neck pain, back pain Integumentary:  No rash, pruritus, skin lesions Neurological: as  above Psychiatric: depression Endocrine: No palpitations, fatigue, diaphoresis, mood swings, change in appetite, change in weight, increased thirst Hematologic/Lymphatic:  No purpura, petechiae. Allergic/Immunologic: no itchy/runny eyes, nasal congestion, recent allergic reactions, rashes  PHYSICAL EXAM: Vitals:   10/16/17 1421  BP: 90/70  Pulse: 83  SpO2: 96%   General: No acute distress.  Patient appears well-groomed.  Head:  Normocephalic/atraumatic Eyes:  Fundi examined but not visualized Neck: supple, no paraspinal tenderness, full range of motion Heart:  Regular rate and rhythm Lungs:  Clear to auscultation bilaterally Back: No paraspinal tenderness Neurological Exam: alert and oriented to person, place, and time. Attention span and concentration intact, recent and remote memory intact, fund of knowledge intact.  Speech fluent and not dysarthric, language intact.  Nystagmus noted on tracking in all directions.  Otherwise, CN II-XII intact. Bulk and tone normal, muscle strength 5/5 throughout.  Sensation to light touch intact.  Deep tendon reflexes 2+ throughout.  Finger to nose with mild tremor and dysmetria.  Gait fairly steady. Romberg with sway.  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  Continue phenobarbital 97.5mg  at bedtime.  Advised to restart calcium 500mg  twice daily and vitamin D 600 IU twice daily.   2.  Continue Cymbalta 30mg  daily for headache prevention 3.  Follow up in one year  27 minutes spent face to face with patient, over 50% spent discussing management.  Metta Clines, DO  CC:  Scarlette Calico, MD

## 2017-10-16 NOTE — Patient Instructions (Signed)
Follow-up in one year or as needed.

## 2017-10-27 ENCOUNTER — Other Ambulatory Visit: Payer: Self-pay | Admitting: Family Medicine

## 2017-10-27 ENCOUNTER — Other Ambulatory Visit: Payer: Self-pay | Admitting: Internal Medicine

## 2017-10-27 DIAGNOSIS — Z8709 Personal history of other diseases of the respiratory system: Secondary | ICD-10-CM

## 2017-11-24 NOTE — Progress Notes (Addendum)
Subjective:   Brandi Ramsey is a 67 y.o. female who presents for Medicare Annual (Subsequent) preventive examination.  Review of Systems:  No ROS.  Medicare Wellness Visit. Additional risk factors are reflected in the social history.  Cardiac Risk Factors include: advanced age (>66men, >58 women);dyslipidemia;hypertension Sleep patterns: gets up 1 times nightly to void and sleeps 6-7 hours nightly.    Home Safety/Smoke Alarms: Feels safe in home. Smoke alarms in place.  Living environment; residence and Firearm Safety: 1-story house/ trailer, no firearms, Lives with husband, no needs for DME, good support system. Seat Belt Safety/Bike Helmet: Wears seat belt.     Objective:     Vitals: BP 114/72   Pulse 74   Resp 17   Ht 5\' 4"  (1.626 m)   Wt 139 lb (63 kg)   SpO2 98%   BMI 23.86 kg/m   Body mass index is 23.86 kg/m.  Advanced Directives 11/25/2017 11/18/2016 03/28/2015 03/09/2015 02/05/2015 01/29/2015 10/24/2014  Does Patient Have a Medical Advance Directive? No No No No No No No  Does patient want to make changes to medical advance directive? - Yes (ED - Information included in AVS) - - - - -  Would patient like information on creating a medical advance directive? Yes (ED - Information included in AVS) - Yes - Educational materials given - No - patient declined information - No - patient declined information  Pre-existing out of facility DNR order (yellow form or pink MOST form) - - - - - - -    Tobacco Social History   Tobacco Use  Smoking Status Never Smoker  Smokeless Tobacco Never Used     Counseling given: Not Answered  Past Medical History:  Diagnosis Date  . Anxiety   . Arthritis   . Cerebellar ataxia ([place])   . Colon polyps   . Depression   . Diabetes mellitus without complication ([place])   . Fibrocystic breast   . Fibromyalgia   . 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 Gastric ulcer   . Hemorrhoids   . History of Doppler ultrasound    a.  Carotid US 3/15: mild soft plaque origin ICA. 1-39% ICA stenosis.  [name] History of echocardiogram    a. Echo 3/15: Moderate LVH, focal basal hypertrophy, EF 60-65%, normal wall motion  . History of nuclear stress test    a. Myoview 5/16: Normal perfusion, EF 78%, low risk  . Hyperlipidemia   . Hypertension   . Obesity   . Pneumonia   . Seizures ([place])   . Stroke ([place])   . Transient cerebral ischemia   . Vertigo, benign paroxysmal    Past Surgical History:  Procedure Laterality Date  . ABDOMINAL HYSTERECTOMY    . BREAST SURGERY     Bilateral mastectomy-TRAM  . FRACTURE SURGERY     tib/fib (R) leg  . LAPAROSCOPIC GASTRIC SLEEVE RESECTION  05/27/2016  . MASTECTOMY     Family History  Problem Relation Age of Onset  . Raynaud syndrome Mother   . Colon polyps Mother   . Heart attack Mother   . Alzheimer's disease Father   . Diabetes Father   . Colon polyps Father   . Fragile X syndrome Son        Patient is carrier  . Diabetes Sister   . Diabetes Brother   . Cervical cancer Maternal Grandmother   . Breast cancer Maternal Grandmother   . Ovarian cancer Maternal Grandmother   .  Heart failure Maternal Grandfather   . Diabetes Paternal Grandmother   . Colon polyps Sister   . Colon polyps Brother   . Colon cancer Maternal Aunt    Social History   Socioeconomic History  . Marital status: Married    Spouse name: Not on file  . Number of children: 2  . Years of education: Not on file  . Highest education level: Not on file  Occupational History  . Not on file  Social Needs  . Financial resource strain: Very hard  . Food insecurity:    Worry: Sometimes true    Inability: Sometimes true  . Transportation needs:    Medical: No    Non-medical: No  Tobacco Use  . Smoking status: Never Smoker  . Smokeless tobacco: Never Used  Substance and Sexual Activity  . Alcohol use: No    Alcohol/week: 0.0 oz  . Drug use: No  . Sexual activity: Yes    Partners: Male    Comment:  1st intercourse 67 yo-Fewer than 5 partners  Lifestyle  . Physical activity:    Days per week: 5 days    Minutes per session: 50 min  . Stress: Very much  Relationships  . Social connections:    Talks on phone: More than three times a week    Gets together: More than three times a week    Attends religious service: More than 4 times per year    Active member of club or organization: Yes    Attends meetings of clubs or organizations: More than 4 times per year    Relationship status: Married  Other Topics Concern  . 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 aspirin EC 81 MG EC tablet Take 1 tablet (81 mg total) by mouth daily.  [name] atorvastatin (LIPITOR) 80 MG tablet Take 1 tablet (80 mg total) by mouth daily at 6 PM.  . clonazePAM (KLONOPIN) 1 MG tablet TAKE 1 TABLET TWICE A DAY AS NEEDED FOR ANXIETY  . DULoxetine (CYMBALTA) 30 MG capsule TAKE 1 CAPSULE BY MOUTH EVERY DAY  . fluticasone (FLONASE) 50 MCG/ACT nasal spray USE 2 SPRAYS INTO EACH NOSTRIL EVERY DAY  . meclizine (ANTIVERT) 12.5 MG tablet TAKE 1 TABLET (12.5 MG TOTAL) BY MOUTH 3 (THREE) TIMES DAILY AS NEEDED FOR DIZZINESS.  [name] pantoprazole (PROTONIX) 40 MG tablet TAKE 1 TABLET TWICE A DAY  . PHENobarbital (LUMINAL) 97.2 MG tablet TAKE 1 TABLET BY MOUTH EVERYDAY AT BEDTIME  . ranitidine (ZANTAC) 150 MG tablet Take 150 mg by mouth 2 (two) times daily as needed (for indigestion).   . [DISCONTINUED] Calcium-Vitamin D 600-200 MG-UNIT per tablet Take 1 tablet by mouth 2 (two) times daily.  . [DISCONTINUED] Vitamin D, Ergocalciferol, (DRISDOL) 50000 UNITS CAPS capsule Take 50,000 Units by mouth once a week. Reported on 08/24/2015   No facility-administered encounter medications on file as of 11/25/2017.     Activities of Daily Living In your present state of health, do you have any  difficulty performing the following activities: 11/25/2017  Hearing? N  Vision? N  Difficulty concentrating or making decisions? N  Walking or climbing stairs? N  Dressing or bathing? N  Doing errands, shopping? N  Preparing Food and eating ? N  Using the Toilet? N  In the past six months, have  you accidently leaked urine? N  Do you have problems with loss of bowel control? N  Managing your Medications? N  Managing your Finances? N  Housekeeping or managing your Housekeeping? N  Some recent data might be hidden    Patient Care Team: Janith Lima, MD as PCP - General (Internal Medicine)    Assessment:   This is a routine wellness examination for Media. Physical assessment deferred to PCP.\  Exercise Activities and Dietary recommendations Current Exercise Habits: Structured exercise class;Home exercise routine, Type of exercise: walking;strength training/weights;calisthenics, Time (Minutes): 50, Frequency (Times/Week): 6, Weekly Exercise (Minutes/Week): 300, Intensity: Mild  Diet (meal preparation, eat out, water intake, caffeinated beverages, dairy products, fruits and vegetables): in general, a "healthy" diet  , well balanced, eats a variety of fruits and vegetables daily, limits salt, fat/cholesterol, sugar,carbohydrates,caffeine, drinks 6-8 glasses of water daily.  Goals    . Patient Stated     [name] and love my family and grandchildren. Enjoy the upcoming [place] trip with my grandchildren and husband.       Fall Risk Fall Risk  11/25/2017 10/16/2017 06/18/2017 11/18/2016 08/14/2015  Falls in the past year? No Yes Yes Yes Yes  Number falls in past yr: - 2 or more 1 1 1   Injury with Fall? - No Yes Yes Yes  Risk Factor Category  - High Fall Risk - - -  Risk for fall due to : - - - - -  Follow up - - - Falls prevention discussed;Education provided Falls evaluation completed   Depression Screen PHQ 2/9 Scores 11/25/2017 06/18/2017 11/18/2016  PHQ - 2 Score 3 0 2  PHQ- 9 Score 7  - 4     Cognitive Function MMSE - Mini Mental State Exam 10/10/2014  Orientation to time 5  Orientation to Place 5  Registration 3  Attention/ Calculation 5  Recall 3  Language- name 2 objects 2  Language- repeat 1  Language- follow 3 step command 3  Language- read & follow direction 1  Write a sentence 1  Copy design 1  Total score 30       Ad8 score reviewed for issues:  Issues making decisions: no  Less interest in hobbies / activities: no  Repeats questions, stories (family complaining): no  Trouble using ordinary gadgets (microwave, computer, phone):no  Forgets the month or year: no  Mismanaging finances: no  Remembering appts: no  Daily problems with thinking and/or memory: no Ad8 score is= 0  Immunization History  Administered Date(s) Administered  . Influenza, High Dose Seasonal PF 02/24/2017  . Influenza,inj,Quad PF,6+ Mos 02/21/2016  . Influenza-Unspecified 03/20/2014, 04/03/2015  . Pneumococcal Conjugate-13 11/25/2017  . Pneumococcal-Unspecified 03/28/2013  . Tdap 09/05/2015  . Zoster 06/23/2012   Screening Tests Health Maintenance  Topic Date Due  . OPHTHALMOLOGY EXAM  09/03/2016  . URINE MICROALBUMIN  09/04/2016  . HEMOGLOBIN A1C  08/24/2017  . INFLUENZA VACCINE  01/21/2018  . COLONOSCOPY  09/10/2018  . FOOT EXAM  11/26/2018  . PNA vac Low Risk Adult (2 of 2 - PPSV23) 11/26/2018  . MAMMOGRAM  03/20/2019  . TETANUS/TDAP  09/04/2025  . DEXA SCAN  Completed  . Hepatitis C Screening  Completed  . HIV Screening  Completed      Plan:    Continue doing brain stimulating activities (puzzles, reading, adult coloring books, staying active) to keep memory sharp.     Continue to eat heart healthy diet (full of fruits, vegetables, whole grains, lean protein, water--limit salt,  fat, and sugar intake) and increase physical activity as tolerated.  I have personally reviewed and noted the following in the patient's chart:   . Medical and social  history . Use of alcohol, tobacco or illicit drugs  . Current medications and supplements . Functional ability and status . Nutritional status . Physical activity . Advanced directives . List of other physicians . Vitals . Screenings to include cognitive, depression, and falls . Referrals and appointments  In addition, I have reviewed and discussed with patient certain preventive protocols, quality metrics, and best practice recommendations. A written personalized care plan for preventive services as well as general preventive health recommendations were provided to patient.    Medical screening examination/treatment/procedure(s) were performed by non-physician practitioner and as supervising physician I was immediately available for consultation/collaboration. I agree with above. Scarlette Calico, MD    Michiel Cowboy, RN  11/25/2017

## 2017-11-25 ENCOUNTER — Ambulatory Visit (INDEPENDENT_AMBULATORY_CARE_PROVIDER_SITE_OTHER): Payer: PPO | Admitting: *Deleted

## 2017-11-25 ENCOUNTER — Telehealth: Payer: Self-pay | Admitting: *Deleted

## 2017-11-25 VITALS — BP 114/72 | HR 74 | Resp 17 | Ht 64.0 in | Wt 139.0 lb

## 2017-11-25 DIAGNOSIS — R7303 Prediabetes: Secondary | ICD-10-CM | POA: Diagnosis not present

## 2017-11-25 DIAGNOSIS — Z23 Encounter for immunization: Secondary | ICD-10-CM

## 2017-11-25 DIAGNOSIS — Z0000 Encounter for general adult medical examination without abnormal findings: Secondary | ICD-10-CM | POA: Diagnosis not present

## 2017-11-25 NOTE — Telephone Encounter (Signed)
During AWV, patient stated that she would like to have the MMR vaccination as she does not know whether or not if she has had the vaccine. Patient also states that she is experiencing dizziness more frequently. She wants to f/u regarding this with Dr. Jaffe and asked if PCP would be willing to contact Dr. Jaffe's office to make an appointment for her asap because she is leaving for vacation at the end of June and wants to be seen for this before she goes. Patient feels that if she made the appointment, she would have to wait several months before she would be seen.  

## 2017-11-25 NOTE — Patient Instructions (Addendum)
[email or web address] or down load app on smart phone  Aunt Berenice Primas website lists multiple social resources for individuals such as: food, health, money, house hold goods, transit, medical supplies, job training and legal services.  Continue doing brain stimulating activities (puzzles, reading, adult coloring books, staying active) to keep memory sharp.     Continue to eat heart healthy diet (full of fruits, vegetables, whole grains, lean protein, water--limit salt, fat, and sugar intake) and increase physical activity as tolerated.   Brandi Ramsey , Thank you for taking time to come for your Medicare Wellness Visit. I appreciate your ongoing commitment to your health goals. Please review the following plan we discussed and let me know if I can assist you in the future.   These are the goals we discussed: Goals    . Patient Stated     Brandi Ramsey and love my family and grandchildren. Enjoy the upcoming [place] trip with my grandchildren and husband.       This is a list of the screening recommended for you and due dates:  Health Maintenance  Topic Date Due  . Eye exam for diabetics  09/03/2016  . Complete foot exam   09/04/2016  . Urine Protein Check  09/04/2016  . Pneumonia vaccines (1 of 2 - PCV13) 01/11/2017  . Hemoglobin A1C  08/24/2017  . Flu Shot  01/21/2018  . Colon Cancer Screening  09/10/2018  . Mammogram  03/20/2019  . Tetanus Vaccine  09/04/2025  . DEXA scan (bone density measurement)  Completed  .  Hepatitis C: One time screening is recommended by Center for Disease Control  (CDC) for  adults born from [birth year] through 1964.   Completed  . HIV Screening  Completed

## 2017-11-26 NOTE — Telephone Encounter (Signed)
Contacted pt -  PCP is okay with pt getting the MMR - do we have this available to give or is there a wait list?   Informed fhat Dr. Georgie Chard schedule is booked and to call so that she get his next available slot. Pt stated understanding.

## 2017-11-26 NOTE — Telephone Encounter (Signed)
Patient advised we do currently have MMR vaccine in our office, ok to give per dr jones---patient has been advised that we are not sure if medicare will cover cost of MMR, and patient states she is willing to pay out of pocket if necessary---she has to get vaccine before trip to [facility] in [place] has scheduled nurse visit for tomorrow 11/27/17

## 2017-11-27 ENCOUNTER — Ambulatory Visit (INDEPENDENT_AMBULATORY_CARE_PROVIDER_SITE_OTHER): Payer: PPO

## 2017-11-27 DIAGNOSIS — Z299 Encounter for prophylactic measures, unspecified: Secondary | ICD-10-CM

## 2017-11-27 DIAGNOSIS — Z23 Encounter for immunization: Secondary | ICD-10-CM

## 2017-12-01 DIAGNOSIS — M546 Pain in thoracic spine: Secondary | ICD-10-CM | POA: Diagnosis not present

## 2017-12-06 ENCOUNTER — Other Ambulatory Visit: Payer: Self-pay | Admitting: Internal Medicine

## 2017-12-09 DIAGNOSIS — E119 Type 2 diabetes mellitus without complications: Secondary | ICD-10-CM | POA: Diagnosis not present

## 2017-12-09 DIAGNOSIS — H04123 Dry eye syndrome of bilateral lacrimal glands: Secondary | ICD-10-CM | POA: Diagnosis not present

## 2017-12-25 ENCOUNTER — Other Ambulatory Visit: Payer: Self-pay | Admitting: Internal Medicine

## 2018-01-01 DIAGNOSIS — H04123 Dry eye syndrome of bilateral lacrimal glands: Secondary | ICD-10-CM | POA: Diagnosis not present

## 2018-01-20 ENCOUNTER — Other Ambulatory Visit: Payer: Self-pay | Admitting: Internal Medicine

## 2018-01-20 DIAGNOSIS — F411 Generalized anxiety disorder: Secondary | ICD-10-CM

## 2018-01-21 NOTE — Telephone Encounter (Signed)
Can you advise in PCP absence.  

## 2018-02-22 ENCOUNTER — Other Ambulatory Visit: Payer: Self-pay | Admitting: Internal Medicine

## 2018-03-04 ENCOUNTER — Other Ambulatory Visit: Payer: Self-pay | Admitting: Internal Medicine

## 2018-03-30 ENCOUNTER — Other Ambulatory Visit: Payer: Self-pay | Admitting: Internal Medicine

## 2018-03-30 DIAGNOSIS — F320 Major depressive disorder, single episode, mild: Secondary | ICD-10-CM

## 2018-05-06 ENCOUNTER — Other Ambulatory Visit: Payer: Self-pay | Admitting: Internal Medicine

## 2018-05-06 DIAGNOSIS — F411 Generalized anxiety disorder: Secondary | ICD-10-CM

## 2018-05-07 NOTE — Telephone Encounter (Signed)
[place] Controlled Substance Database checked. Last filled on 04/11/2018, 20 day supply

## 2018-05-25 ENCOUNTER — Ambulatory Visit: Payer: Self-pay | Admitting: *Deleted

## 2018-05-25 NOTE — Telephone Encounter (Signed)
I think there are some same days we can use. The medication she is request will require a prior authorization. Any day that is good for the patient is fine with [place].

## 2018-05-25 NOTE — Telephone Encounter (Signed)
Appointment scheduled.

## 2018-05-25 NOTE — Telephone Encounter (Signed)
Pt called to schedule an appointment for medication for dizziness. She has had dizziness for about 20 years. It doesn't last very long, only a few seconds per pt. She is not dizzy today. She is going on a Christmas cruise and wanted to know if she can get a prescription for the patch that goes behind the ear for sea sickness, a scope patch.  The next available appointment is 06/21/18. So she is asking for this to be called in before her trip, along with meclizine. She leaves to go out of town on the 05/1818. Appointment scheduled per pt request, only wants to see her provider. Will route to flow at [facility].  She is requesting a call back please.   Reason for Disposition . Dizziness is a chronic symptom (recurrent or ongoing AND present > 4 weeks)  Answer Assessment - Initial Assessment Questions 1. DESCRIPTION: "Describe your dizziness."     Gravity is going to pull me down 2. LIGHTHEADED: "Do you feel lightheaded?" (e.g., somewhat faint, woozy, weak upon standing)     yes 3. VERTIGO: "Do you feel like either you or the room is spinning or tilting?" (i.e. vertigo)     no 4. SEVERITY: "How bad is it?"  "Do you feel like you are going to faint?" "Can you stand and walk?"   - MILD - walking normally   - MODERATE - interferes with normal activities (e.g., work, school)    - SEVERE - unable to stand, requires support to walk, feels like passing out now.      Can be mild for a few seconds and goes away 5. ONSET:  "When did the dizziness begin?"     For about 20 years 6. AGGRAVATING FACTORS: "Does anything make it worse?" (e.g., standing, change in head position)      standing 7. HEART RATE: "Can you tell me your heart rate?" "How many beats in 15 seconds?"  (Note: not all patients can do this)       It is fine per pt  HR 72 8. CAUSE: "What do you think is causing the dizziness?"     Possibly inner ear 9. RECURRENT SYMPTOM: "Have you had dizziness before?" If so, ask: "When was the last  time?" "What happened that time?"     yes 10. OTHER SYMPTOMS: "Do you have any other symptoms?" (e.g., fever, chest pain, vomiting, diarrhea, bleeding)       No  11. PREGNANCY: "Is there any chance you are pregnant?" "When was your last menstrual period?"       n/a  Protocols used: DIZZINESS [facility]

## 2018-05-31 ENCOUNTER — Other Ambulatory Visit (INDEPENDENT_AMBULATORY_CARE_PROVIDER_SITE_OTHER): Payer: PPO

## 2018-05-31 ENCOUNTER — Encounter: Payer: Self-pay | Admitting: Internal Medicine

## 2018-05-31 ENCOUNTER — Ambulatory Visit (INDEPENDENT_AMBULATORY_CARE_PROVIDER_SITE_OTHER): Payer: PPO | Admitting: Internal Medicine

## 2018-05-31 VITALS — BP 114/64 | HR 96 | Temp 97.9°F | Resp 16 | Ht 64.0 in | Wt 142.0 lb

## 2018-05-31 DIAGNOSIS — R7303 Prediabetes: Secondary | ICD-10-CM

## 2018-05-31 DIAGNOSIS — M25511 Pain in right shoulder: Secondary | ICD-10-CM | POA: Diagnosis not present

## 2018-05-31 DIAGNOSIS — M75121 Complete rotator cuff tear or rupture of right shoulder, not specified as traumatic: Secondary | ICD-10-CM | POA: Diagnosis not present

## 2018-05-31 DIAGNOSIS — E785 Hyperlipidemia, unspecified: Secondary | ICD-10-CM

## 2018-05-31 DIAGNOSIS — R42 Dizziness and giddiness: Secondary | ICD-10-CM | POA: Diagnosis not present

## 2018-05-31 DIAGNOSIS — K219 Gastro-esophageal reflux disease without esophagitis: Secondary | ICD-10-CM | POA: Diagnosis not present

## 2018-05-31 DIAGNOSIS — Z903 Acquired absence of stomach [part of]: Secondary | ICD-10-CM | POA: Diagnosis not present

## 2018-05-31 DIAGNOSIS — H9193 Unspecified hearing loss, bilateral: Secondary | ICD-10-CM

## 2018-05-31 DIAGNOSIS — M25811 Other specified joint disorders, right shoulder: Secondary | ICD-10-CM | POA: Diagnosis not present

## 2018-05-31 DIAGNOSIS — E7800 Pure hypercholesterolemia, unspecified: Secondary | ICD-10-CM | POA: Diagnosis not present

## 2018-05-31 DIAGNOSIS — E663 Overweight: Secondary | ICD-10-CM | POA: Diagnosis not present

## 2018-05-31 LAB — LIPID PANEL
CHOL/HDL RATIO: 3
CHOLESTEROL: 144 mg/dL (ref 0–200)
HDL: 55.7 mg/dL (ref >39.00)
LDL CALC: 74 mg/dL (ref 0–99)
NonHDL: 88.12
Triglycerides: 69 mg/dL (ref 0.0–149.0)
VLDL: 13.8 mg/dL (ref 0.0–40.0)

## 2018-05-31 LAB — COMPREHENSIVE METABOLIC PANEL
ALBUMIN: 3.8 g/dL (ref 3.5–5.2)
ALT: 22 U/L (ref 0–35)
AST: 26 U/L (ref 0–37)
Alkaline Phosphatase: 80 U/L (ref 39–117)
BILIRUBIN TOTAL: 0.2 mg/dL (ref 0.2–1.2)
BUN: 31 mg/dL — AB (ref 6–23)
CHLORIDE: 104 meq/L (ref 96–112)
CO2: 26 meq/L (ref 19–32)
Calcium: 8.7 mg/dL (ref 8.4–10.5)
Creatinine, Ser: 0.82 mg/dL (ref 0.40–1.20)
GFR: 74.05 mL/min (ref >60.00)
Glucose, Bld: 83 mg/dL (ref 70–99)
Potassium: 4.6 mEq/L (ref 3.5–5.1)
SODIUM: 138 meq/L (ref 135–145)
Total Protein: 6.5 g/dL (ref 6.0–8.3)

## 2018-05-31 MED ORDER — MECLIZINE HCL 12.5 MG PO TABS: 12.5000 mg | ORAL_TABLET | Freq: Three times a day (TID) | ORAL | 1 refills | Status: DC | PRN

## 2018-05-31 MED ORDER — SCOPOLAMINE 1 MG/3DAYS TD PT72: 1.0000 | MEDICATED_PATCH | TRANSDERMAL | 0 refills | Status: DC

## 2018-05-31 NOTE — Progress Notes (Signed)
Subjective:  Patient ID: Brandi Ramsey, female    DOB: [birth year]  Age: 67 y.o. MRN: [id number]  CC: Hyperlipidemia   HPI Brandi Ramsey presents for f/up - She continues to complain of intermittent episodes of dizziness, vertigo, and a gradual decrease in her hearing over the last decade.  She denies headache, visual disturbance, or paresthesias.  She ran a 5K 5 days ago.  Outpatient Medications Prior to Visit  Medication Sig Dispense Refill  . aspirin EC 81 MG EC tablet Take 1 tablet (81 mg total) by mouth daily.    [name] atorvastatin (LIPITOR) 80 MG tablet TAKE 1 TABLET BY MOUTH DAILY AT 6 PM. 90 tablet 1  . clonazePAM (KLONOPIN) 1 MG tablet TAKE 1 TABLET BY MOUTH TWICE A DAY AS NEEDED FOR ANXIETY 45 tablet 2  . DULoxetine (CYMBALTA) 30 MG capsule TAKE 1 CAPSULE BY MOUTH EVERY DAY 90 capsule 1  . pantoprazole (PROTONIX) 40 MG tablet TAKE 1 TABLET TWICE A DAY 180 tablet 1  . PHENobarbital (LUMINAL) 97.2 MG tablet TAKE 1 TABLET BY MOUTH EVERYDAY AT BEDTIME 30 tablet 2  . fluticasone (FLONASE) 50 MCG/ACT nasal spray USE 2 SPRAYS INTO EACH NOSTRIL EVERY DAY 16 g 1  . meclizine (ANTIVERT) 12.5 MG tablet TAKE 1 TABLET (12.5 MG TOTAL) BY MOUTH 3 (THREE) TIMES DAILY AS NEEDED FOR DIZZINESS. 45 tablet 1  . ranitidine (ZANTAC) 150 MG tablet Take 150 mg by mouth 2 (two) times daily as needed (for indigestion).      No facility-administered medications prior to visit.     ROS Review of Systems  Constitutional: Negative for diaphoresis and fatigue.  HENT: Positive for hearing loss. Negative for sinus pressure and trouble swallowing.   Eyes: Negative for visual disturbance.  Respiratory: Negative for cough, chest tightness, shortness of breath and wheezing.   Cardiovascular: Negative for chest pain, palpitations and leg swelling.  Gastrointestinal: Negative for abdominal pain, constipation, diarrhea, nausea and vomiting.  Genitourinary: Negative.  Negative for difficulty urinating.  Musculoskeletal:  Negative.   Skin: Negative.   Neurological: Positive for dizziness. Negative for weakness, light-headedness, numbness and headaches.  Hematological: Negative for adenopathy. Does not bruise/bleed easily.  Psychiatric/Behavioral: Negative.     Objective:  BP 114/64   Pulse 96   Temp 97.9 F (36.6 C) (Oral)   Resp 16   Ht 5\' 4"  (1.626 m)   Wt 142 lb (64.4 kg)   SpO2 98%   BMI 24.37 kg/m   BP Readings from Last 3 Encounters:  05/31/18 114/64  11/25/17 114/72  10/16/17 90/70    Wt Readings from Last 3 Encounters:  05/31/18 142 lb (64.4 kg)  11/25/17 139 lb (63 kg)  10/16/17 135 lb (61.2 kg)    Physical Exam  Constitutional: She is oriented to person, place, and time. No distress.  HENT:  Mouth/Throat: Oropharynx is clear and moist. No oropharyngeal exudate.  Eyes: Conjunctivae are normal. No scleral icterus.  Neck: Normal range of motion. Neck supple. No JVD present. No thyromegaly present.  Cardiovascular: Normal rate, regular rhythm and normal heart sounds.  No murmur heard. Pulmonary/Chest: Effort normal and breath sounds normal. No respiratory distress. She has no wheezes. She has no rales.  Abdominal: Soft. Bowel sounds are normal. She exhibits no mass. There is no hepatosplenomegaly. There is no tenderness.  Musculoskeletal: Normal range of motion. She exhibits no edema, tenderness or deformity.  Lymphadenopathy:    She has no cervical adenopathy.  Neurological: She is alert and  oriented to person, place, and time. She has normal strength and normal reflexes. She displays no atrophy and no tremor. No cranial nerve deficit. She exhibits normal muscle tone. She displays a negative Romberg sign. She displays no seizure activity. Coordination and gait normal.  Skin: Skin is warm and dry. She is not diaphoretic. No pallor.  Vitals reviewed.   Lab Results  Component Value Date   WBC 6.0 07/24/2017   HGB 13.1 07/24/2017   HCT 38.8 07/24/2017   PLT 210 07/24/2017    GLUCOSE 101 (H) 02/24/2017   CHOL 172 11/18/2016   TRIG 146.0 11/18/2016   HDL 40.40 11/18/2016   LDLCALC 102 (H) 11/18/2016   ALT 20 02/24/2017   AST 23 02/24/2017   NA 138 02/24/2017   K 4.5 02/24/2017   CL 102 02/24/2017   CREATININE 0.81 02/24/2017   BUN 15 02/24/2017   CO2 29 02/24/2017   TSH 0.76 02/24/2017   HGBA1C 5.6 02/24/2017   MICROALBUR 1.4 09/05/2015    Mm Digital Screening Bilateral  Result Date: 03/19/2017 CLINICAL DATA:  Screening. EXAM: DIGITAL SCREENING BILATERAL MAMMOGRAM WITH CAD COMPARISON:  None. ACR Breast Density Category a: The breast tissue is almost entirely fatty. FINDINGS: There are no findings suspicious for malignancy. Images were processed with CAD. IMPRESSION: No mammographic evidence of malignancy. A result letter of this screening mammogram will be mailed directly to the patient. RECOMMENDATION: Screening mammogram in one year. (Code:SM-B-01Y) BI-RADS CATEGORY  1: Negative. Electronically Signed   By: Ammie Ferrier M.D.   On: 03/19/2017 16:52    Assessment & Plan:   Elyna was seen today for hyperlipidemia.  Diagnoses and all orders for this visit:  Hyperlipidemia with target LDL less than 100- She has a low ASCVD risk score so I do not recommend a statin for CV risk reduction. -     Lipid panel; Future -     Comprehensive metabolic panel; Future  Prediabetes- Her blood sugar is normal. -     Comprehensive metabolic panel; Future  Bilateral hearing loss, unspecified hearing loss type -     Ambulatory referral to ENT  Vertigo -     scopolamine (TRANSDERM-SCOP) 1 MG/3DAYS; Place 1 patch (1.5 mg total) onto the skin every 3 (three) days. -     meclizine (ANTIVERT) 12.5 MG tablet; Take 1 tablet (12.5 mg total) by mouth 3 (three) times daily as needed for dizziness.   I have discontinued [name]. [name]'s ranitidine and fluticasone. I am also having her start on scopolamine. Additionally, I am having her maintain her aspirin, atorvastatin,  pantoprazole, PHENobarbital, DULoxetine, clonazePAM, and meclizine.  Meds ordered this encounter  Medications  . scopolamine (TRANSDERM-SCOP) 1 MG/3DAYS    Sig: Place 1 patch (1.5 mg total) onto the skin every 3 (three) days.    Dispense:  4 patch    Refill:  0  . meclizine (ANTIVERT) 12.5 MG tablet    Sig: Take 1 tablet (12.5 mg total) by mouth 3 (three) times daily as needed for dizziness.    Dispense:  45 tablet    Refill:  1    DX Code Needed  .     Follow-up: No follow-ups on file.  Scarlette Calico, MD

## 2018-05-31 NOTE — Patient Instructions (Signed)

## 2018-06-06 ENCOUNTER — Other Ambulatory Visit: Payer: Self-pay | Admitting: Internal Medicine

## 2018-06-21 ENCOUNTER — Ambulatory Visit: Payer: PPO | Admitting: Internal Medicine

## 2018-06-21 DIAGNOSIS — Z0289 Encounter for other administrative examinations: Secondary | ICD-10-CM

## 2018-06-25 ENCOUNTER — Other Ambulatory Visit: Payer: Self-pay | Admitting: Internal Medicine

## 2018-08-26 ENCOUNTER — Other Ambulatory Visit: Payer: Self-pay | Admitting: Internal Medicine

## 2018-08-31 ENCOUNTER — Other Ambulatory Visit: Payer: Self-pay | Admitting: Internal Medicine

## 2018-09-10 ENCOUNTER — Encounter: Payer: Self-pay | Admitting: Gastroenterology

## 2018-10-04 ENCOUNTER — Other Ambulatory Visit: Payer: Self-pay | Admitting: Internal Medicine

## 2018-10-04 DIAGNOSIS — F411 Generalized anxiety disorder: Secondary | ICD-10-CM

## 2018-10-14 NOTE — Progress Notes (Signed)
Virtual Visit via Video Note The purpose of this virtual visit is to provide medical care while limiting exposure to the novel coronavirus.    Consent was obtained for video visit:  Yes.   Answered questions that patient had about telehealth interaction:  Yes.   I discussed the limitations, risks, security and privacy concerns of performing an evaluation and management service by telemedicine. I also discussed with the patient that there may be a patient responsible charge related to this service. The patient expressed understanding and agreed to proceed.  Pt location: Home Physician Location: office Name of referring provider:  Janith Lima, MD I connected with Brandi Ramsey at patients initiation/request on 10/15/2018 at  2:10 PM EDT by video enabled telemedicine application and verified that I am speaking with the correct person using two identifiers. Pt MRN:  [id number] Pt DOB:  [birth year] Video Participants:  Brandi Ramsey   History of Present Illness:  Brandi Ramsey is a 68 year old woman with hypercholesterolemia, type 2 diabetes mellitus, migraine, depression and remote history of seizure disorder, concussion and TIA who follows up for fragile X tremor-associated syndrome, tension-type headache.  UPDATE: Since the new year, she reports episodes of near syncope.  When she stands up, she feels lightheaded (like she is going to pass out) and her vision goes black, but she never actually loss consciousness.  It lasts a few seconds.  There is no associated spinning sensation, slurred speech, facial droop or unilateral numbness or weakness.  It occurs daily.  Afterwards, she has a dull non-throbbing frontal headache that aborts shortly after taking ibuprofen or acetaminophen.  She is taking analgesics daily.  She says she drinks 64 oz of water daily and she walks 6 miles daily.  She does report increased emotional stress which has increased since the [place] pandemic.  She is unable to see her  special needs Ramsey during the quarantine.  Her tremor has been worse.  CMP from December was unremarkable except for elevated BUN of 31.  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 About 22 years ago, she began experiencing dizzy spells. She apparently had imaging of the head which was suspicious for possible multiple sclerosis. She had lumbar puncture, which was negative. About 10 years ago, she developed mild tremor and unsteadiness. She was worked up by Dr. Hazle Nordmann, a fragile X specialist at [facility], who diagnosed her with fragile X-associated tremor ataxia syndrome. Work up included genetic testing for FMR1 gene premutation.   She has had a progressive course over the years. She will have dizzy spells, described as lightheadedness, which will cause falls. She has fractured her wrists and leg. She also has tremor of the hands, causing difficulty with writing. She also developed dysphonia and was found to have tremor involving her "voice box". She reports prior episodes of both bowel and bladder incontinence, which are rare. She also reports poor depth perception. For example, she may sometimes drive too close to the curb or she may accidentally bump into somebody when walking through the grocery store aisle. She reports increased fatigue and weight gain. She has depression and mood swings. She also reports short-term memory problems. She also has problems with swallowing and has choked on some occasions, requiring the Heimlich maneuver.  She has a remote history of epilepsy. She had convulsions as a baby and two generalized tonic-clonic seizures in her early twenties. She has been on phenobarbital ever since. No recurrent seizures.  A DEXA scan was performed in 2016 and was negative. She continues on phenobarbital 97.5mg  at bedtime.  She also has history of migraine, described as severe pressure and throbbing, over her left eye. In March 2015, she had  such a headache associated with right sided facial numbness and lower facial weakness. She was admitted to [facility] for TIA. CT of the head was unremarkable. MRI of the brain showed periventricular and subcortical hyperintensities. MRA of the head showed no major intracranial arterial stenosis, however there is question of mild narrowing of the left cavernous ICA. Carotid duplex showed 1-39% bilateral ICA stenosis. 2D echo showed LVEF of 60-65% with moderate LVH.   She suffered postconcussion syndrome since slipping and hitting the back of her head in August 2016. She says she passed afterwards for a few seconds. Afterwards, she noted headache, spinning sensation, nausea and she vomited. She went to the ED where CT of the head and cervical spine showed no acute findings. She was discharged home with meclizine and hydrocodone. Since she has been home, she still notes constant dull posterior headache. She takes ibuprofen for it daily but not the hydrocodone. The dizziness is improving. She has been more emotional and was crying easily. She is seeing Dr. Hulan Saas of Sports Medicine for management.  Due to concerns of possible prior history of MS, she underwent MRI of the brain and cervical spine with and without contrast on 03/15/15.  There was slight progression of nonspecific cerebral white matter changes, most probably chronic small vessel disease, compared to 2012.  Cervical spine showed no cord lesions.    Past medications:  venlafaxine   Past Medical History: Past Medical History:  Diagnosis Date  . Anxiety   . Arthritis   . Cerebellar ataxia ([place])   . Colon polyps   . Depression   . Diabetes mellitus without complication ([place])   . Fibrocystic breast   . Fibromyalgia   . 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 Gastric ulcer   . Hemorrhoids   . History of Doppler ultrasound    a. Carotid US 3/15: mild soft plaque origin ICA. 1-39% ICA stenosis.  [name]  History of echocardiogram    a. Echo 3/15: Moderate LVH, focal basal hypertrophy, EF 60-65%, normal wall motion  . History of nuclear stress test    a. Myoview 5/16: Normal perfusion, EF 78%, low risk  . Hyperlipidemia   . Hypertension   . Obesity   . Pneumonia   . Seizures ([place])   . Stroke ([place])   . Transient cerebral ischemia   . Vertigo, benign paroxysmal     Medications: Outpatient Encounter Medications as of 10/15/2018  Medication Sig  . aspirin EC 81 MG EC tablet Take 1 tablet (81 mg total) by mouth daily.  [name] atorvastatin (LIPITOR) 80 MG tablet TAKE 1 TABLET BY MOUTH DAILY AT 6 PM.  . clonazePAM (KLONOPIN) 1 MG tablet TAKE 1 TABLET BY MOUTH TWICE A DAY AS NEEDED FOR ANXIETY  . DULoxetine (CYMBALTA) 30 MG capsule TAKE 1 CAPSULE BY MOUTH EVERY DAY  . meclizine (ANTIVERT) 12.5 MG tablet Take 1 tablet (12.5 mg total) by mouth 3 (three) times daily as needed for dizziness.  . pantoprazole (PROTONIX) 40 MG tablet TAKE 1 TABLET TWICE A DAY  . PHENobarbital (LUMINAL) 97.2 MG tablet TAKE 1 TABLET BY MOUTH EVERYDAY AT BEDTIME  . scopolamine (TRANSDERM-SCOP) 1 MG/3DAYS Place 1 patch (1.5 mg total) onto the skin every 3 (three)  days.   No facility-administered encounter medications on file as of 10/15/2018.     Allergies: Allergies  Allergen Reactions  . Buprenorphine Hcl Hives and Itching  . Hydromorphone     Could not focus, "Seeing scary things."  Noises louder. Altered mental status  . Meperidine Hives and Itching    Other reaction(s): HIVES,ITCHING  . Metformin And Related Diarrhea  . Morphine And Related Hives and Itching    States only when mixed with Demerol  . Pentazocine Anaphylaxis  . Pentazocine Lactate Anaphylaxis  . Phenytoin Hives    Other reaction(s): RASH  . Demerol Hives and Itching  . Dilaudid [Hydromorphone Hcl]     Hallucinations and felt crazy   . Doxycycline Hyclate     Other reaction(s): GI UPSET,NAUSEA,VOMITING  . Ibuprofen     Contraindicated for  risk of GI bleeding.  [name] Phenytoin Sodium Extended Hives  . Codeine Rash and Nausea Only    When mixed with Demerol Patient reports no reaction with Codeine/APAP (Tylenol #3)  . Doxycycline Hyclate Nausea And Vomiting    Other reaction(s): GI UPSET,NAUSEA,VOMITING Other reaction(s): GI UPSET,NAUSEA,VOMITING    Family History: Family History  Problem Relation Age of Onset  . Raynaud syndrome Mother   . Colon polyps Mother   . Heart attack Mother   . Alzheimer's disease Father   . Diabetes Father   . Colon polyps Father   . Fragile X syndrome Ramsey        Patient is carrier  . Diabetes Sister   . Diabetes Brother   . Cervical cancer Maternal Grandmother   . Breast cancer Maternal Grandmother   . Ovarian cancer Maternal Grandmother   . Heart failure Maternal Grandfather   . Diabetes Paternal Grandmother   . Colon polyps Sister   . Colon polyps Brother   . Colon cancer Maternal Aunt     Social History: Social History   Socioeconomic History  . Marital status: Married    Spouse name: Not on file  . Number of children: 2  . Years of education: Not on file  . Highest education level: Not on file  Occupational History  . Not on file  Social Needs  . Financial resource strain: Very hard  . Food insecurity:    Worry: Sometimes true    Inability: Sometimes true  . Transportation needs:    Medical: No    Non-medical: No  Tobacco Use  . Smoking status: Never Smoker  . Smokeless tobacco: Never Used  Substance and Sexual Activity  . Alcohol use: No    Alcohol/week: 0.0 standard drinks  . Drug use: No  . Sexual activity: Yes    Partners: Male    Comment: 1st intercourse 68 yo-Fewer than 5 partners  Lifestyle  . Physical activity:    Days per week: 5 days    Minutes per session: 50 min  . Stress: Very much  Relationships  . Social connections:    Talks on phone: More than three times a week    Gets together: More than three times a week    Attends religious service:  More than 4 times per year    Active member of club or organization: Yes    Attends meetings of clubs or organizations: More than 4 times per year    Relationship status: Married  . Intimate partner violence:    Fear of current or ex partner: No    Emotionally abused: No    Physically abused: No  Forced sexual activity: No  Other Topics Concern  . 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 Alert and oriented.  Memory, attention, concentration and fund of knowledge intact.  Speech fluent and not dysarthric. Language intact.  Eyes orthophoric and move in all directions.  Face symmetric.  Tremor  Assessment and Plan:   1.  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  Depression 3.  Tension type headache, not intractable 4.  Migraine with aura, without status migrainosus, not intractable 5.  History of generalized epilepsy 6.  Near syncope, possible orthostatic hypotension. 7.  Anxiety  1.  Phenobarbital 97.5mg  at bedtime.   2.  Calcium 500mg  twice daily and vitamin D 600 IU twice daily 3.  Cymbalta 30mg  daily for headache prophylaxis 4.  Limit use of pain relievers to no more than 2 days out of week to prevent risk of rebound or medication-overuse headache. 5.  Regarding episodes of near-syncope.  Suggested to take care and not rush standing up.  I advised that she should contact Dr. Ronnald Ramp for further evaluation. 6.  I will have her follow up in 6 months or sooner if needed.  Follow Up Instructions:    -I discussed the assessment and treatment plan with the patient. The patient was provided an opportunity to ask questions and all were answered. The patient agreed with the plan and demonstrated an understanding of the instructions.   The patient was advised to call back or seek an in-person evaluation if the  symptoms worsen or if the condition fails to improve as anticipated.    Total Time spent in visit with the patient was:  31 minutes.  Dudley Major, DO

## 2018-10-15 ENCOUNTER — Encounter: Payer: Self-pay | Admitting: Neurology

## 2018-10-15 ENCOUNTER — Telehealth (INDEPENDENT_AMBULATORY_CARE_PROVIDER_SITE_OTHER): Payer: PPO | Admitting: Neurology

## 2018-10-15 ENCOUNTER — Other Ambulatory Visit: Payer: Self-pay

## 2018-10-15 VITALS — HR 66 | Ht 64.5 in | Wt 140.0 lb

## 2018-10-15 DIAGNOSIS — F419 Anxiety disorder, unspecified: Secondary | ICD-10-CM

## 2018-10-15 DIAGNOSIS — Q992 Fragile X chromosome: Secondary | ICD-10-CM

## 2018-10-15 DIAGNOSIS — G250 Essential tremor: Secondary | ICD-10-CM

## 2018-10-15 DIAGNOSIS — G119 Hereditary ataxia, unspecified: Secondary | ICD-10-CM | POA: Diagnosis not present

## 2018-10-15 DIAGNOSIS — G43909 Migraine, unspecified, not intractable, without status migrainosus: Secondary | ICD-10-CM

## 2018-10-15 DIAGNOSIS — I951 Orthostatic hypotension: Secondary | ICD-10-CM

## 2018-10-15 DIAGNOSIS — G40309 Generalized idiopathic epilepsy and epileptic syndromes, not intractable, without status epilepticus: Secondary | ICD-10-CM

## 2018-10-15 DIAGNOSIS — G44219 Episodic tension-type headache, not intractable: Secondary | ICD-10-CM

## 2018-10-18 ENCOUNTER — Ambulatory Visit: Payer: PPO | Admitting: Neurology

## 2018-10-20 ENCOUNTER — Telehealth: Payer: Self-pay | Admitting: Cardiology

## 2018-10-20 NOTE — Telephone Encounter (Signed)
[email or web address] [phone] Brandi Ramsey consent/ 4.29.20  YOUR CARDIOLOGY TEAM HAS ARRANGED FOR AN E-VISIT FOR YOUR APPOINTMENT - PLEASE REVIEW IMPORTANT INFORMATION BELOW SEVERAL DAYS PRIOR TO YOUR APPOINTMENT  Due to the recent COVID-19 pandemic, we are transitioning in-person office visits to tele-medicine visits in an effort to decrease unnecessary exposure to our patients, their families, and staff. These visits are billed to your insurance just like a normal visit is. We also encourage you to sign up for MyChart if you have not already done so. You will need a smartphone if possible. For patients that do not have this, we can still complete the visit using a regular telephone but do prefer a smartphone to enable video when possible. You may have a family member that lives with you that can help. If possible, we also ask that you have a blood pressure cuff and scale at home to measure your blood pressure, heart rate and weight prior to your scheduled appointment. Patients with clinical needs that need an in-person evaluation and testing will still be able to come to the office if absolutely necessary. If you have any questions, feel free to call our office.     YOUR PROVIDER WILL BE USING THE FOLLOWING PLATFORM TO COMPLETE YOUR VISIT:  [email or web address]     IF USING MYCHART - How to Download the MyChart App to Your SmartPhone   - If Apple, go to [employer] and type in MyChart in the search bar and download the app. If Android, ask patient to go to [employer] and type in [place] in the search bar and download the app. The app is free but as with any other app downloads, your phone may require you to verify saved payment information or Apple/Android password.  - You will need to then log into the app with your MyChart username and password, and select [facility] as your healthcare provider to link the account.  - When it is time for your visit, go to the MyChart app, find appointments, and  click Begin Video Visit. Be sure to Select Allow for your device to access the Microphone and Camera for your visit. You will then be connected, and your provider will be with you shortly.  **If you have any issues connecting or need assistance, please contact MyChart service desk [phone])**  **If using a computer, in order to ensure the best quality for your visit, you will need to use either of the following Internet Browsers: Insurance underwriter or Microsoft Edge**   IF USING DOXIMITY or [email or web address] - The staff will give you instructions on receiving your link to join the meeting the day of your visit.      2-3 DAYS BEFORE YOUR APPOINTMENT  You will receive a telephone call from one of our [facility] team members - your caller ID may say "Unknown caller." If this is a video visit, we will walk you through how to get the video launched on your phone. We will remind you check your blood pressure, heart rate and weight prior to your scheduled appointment. If you have an Apple Watch or Kardia, please upload any pertinent ECG strips the day before or morning of your appointment to [place]. Our staff will also make sure you have reviewed the consent and agree to move forward with your scheduled tele-health visit.     THE DAY OF YOUR APPOINTMENT  Approximately 15 minutes prior to your scheduled appointment, you will receive a telephone call from one of [facility]  team - your caller ID may say "Unknown caller."  Our staff will confirm medications, vital signs for the day and any symptoms you may be experiencing. Please have this information available prior to the time of visit start. It may also be helpful for you to have a pad of paper and pen handy for any instructions given during your visit. They will also walk you through joining the smartphone meeting if this is a video visit.    CONSENT FOR TELE-HEALTH VISIT - PLEASE REVIEW  I hereby voluntarily request, consent and authorize [facility] and its employed or contracted physicians, physician assistants, nurse practitioners or other licensed health care professionals (the Practitioner), to provide me with telemedicine health care services (the Services") as deemed necessary by the treating Practitioner. I acknowledge and consent to receive the Services by the Practitioner via telemedicine. I understand that the telemedicine visit will involve communicating with the Practitioner through live audiovisual communication technology and the disclosure of certain medical information by electronic transmission. I acknowledge that I have been given the opportunity to request an in-person assessment or other available alternative prior to the telemedicine visit and am voluntarily participating in the telemedicine visit.  I understand that I have the right to withhold or withdraw my consent to the use of telemedicine in the course of my care at any time, without affecting my right to future care or treatment, and that the Practitioner or I may terminate the telemedicine visit at any time. I understand that I have the right to inspect all information obtained and/or recorded in the course of the telemedicine visit and may receive copies of available information for a reasonable fee.  I understand that some of the potential risks of receiving the Services via telemedicine include:   Delay or interruption in medical evaluation due to technological equipment failure or disruption;  Information transmitted may not be sufficient (e.g. poor resolution of images) to allow for appropriate medical decision making by the Practitioner; and/or   In rare instances, security protocols could fail, causing a breach of personal health information.  Furthermore, I acknowledge that it is my responsibility to provide information about my medical history, conditions and care that is complete and accurate to the best of my ability. I acknowledge that Practitioner's  advice, recommendations, and/or decision may be based on factors not within their control, such as incomplete or inaccurate data provided by me or distortions of diagnostic images or specimens that may result from electronic transmissions. I understand that the practice of medicine is not an exact science and that Practitioner makes no warranties or guarantees regarding treatment outcomes. I acknowledge that I will receive a copy of this consent concurrently upon execution via email to the email address I last provided but may also request a printed copy by calling the office of [facility].    I understand that my insurance will be billed for this visit.   I have read or had this consent read to me.  I understand the contents of this consent, which adequately explains the benefits and risks of the Services being provided via telemedicine.   I have been provided ample opportunity to ask questions regarding this consent and the Services and have had my questions answered to my satisfaction.  I give my informed consent for the services to be provided through the use of telemedicine in my medical care  By participating in this telemedicine visit I agree to the above.

## 2018-10-21 DIAGNOSIS — I129 Hypertensive chronic kidney disease with stage 1 through stage 4 chronic kidney disease, or unspecified chronic kidney disease: Secondary | ICD-10-CM

## 2018-10-21 DIAGNOSIS — N181 Chronic kidney disease, stage 1: Secondary | ICD-10-CM

## 2018-10-21 DIAGNOSIS — E1122 Type 2 diabetes mellitus with diabetic chronic kidney disease: Secondary | ICD-10-CM | POA: Insufficient documentation

## 2018-10-21 NOTE — Progress Notes (Signed)
Virtual Visit via Video Note   This visit type was conducted due to national recommendations for restrictions regarding the COVID-19 Pandemic (e.g. social distancing) in an effort to limit this patient's exposure and mitigate transmission in our community.  Due to her co-morbid illnesses, this patient is at least at moderate risk for complications without adequate follow up.  This format is felt to be most appropriate for this patient at this time.  All issues noted in this document were discussed and addressed.  A limited physical exam was performed with this format.  Please refer to the patient's chart for her consent to telehealth for [facility].   Evaluation Performed:  Follow-up visit  This visit type was conducted due to national recommendations for restrictions regarding the COVID-19 Pandemic (e.g. social distancing).  This format is felt to be most appropriate for this patient at this time.  All issues noted in this document were discussed and addressed.  No physical exam was performed (except for noted visual exam findings with Video Visits).  Please refer to the patient's chart (MyChart message for video visits and phone note for telephone visits) for the patient's consent to telehealth for [facility].  Date:  10/22/2018   ID:  Brandi Ramsey, DOB 07-22-1950, MRN [id number]  Patient Location:  Home  Provider location:   [place]  PCP:  Janith Lima, MD  Cardiologist:  Fransico Him, MD Electrophysiologist:  None   Chief Complaint:  Tachycardia and hyperlipdiemia and syncope  History of Present Illness:    Brandi Ramsey is a 68 y.o. female who presents via audio/video conferencing for a telehealth visit today.    Brandi Ramsey is a 68y.o. female with a hx of DM, tachycardia, HL. She was evaluated by me in the hospital in 5/16 with chest pain. Outpatient nuclear stress test was low risk and negative for ischemia with normal LV function.  She was seen by Richardson Dopp, PA  11/2015 for preoperative clearance for bariatric surgery.  She was felt to be low risk for bariatric surgery.   She was seen by neurology on 10/15/2018 due to an episodes of near syncope.  She was having problems with dizziness and states that when she would stand up she would feel very lightheaded like she was going to pass out and her vision would go black but she would never lose consciousness.  It would last a few seconds and then resolved.  There were no symptoms that sounded like vertigo or TIA.  After the symptoms past she would then have a dull non-throbbing frontal headache that would stop after taking ibuprofen.  It was felt that likely her symptoms were related to orthostatic hypotension.  She is here today for followup and is doing well.  She denies any chest pain or pressure, SOB, DOE, PND, orthopnea, LE edema,  palpitations or syncope.  She tells me that after her weight loss surgery she lost over 100 pounds but has only gained 10 pounds back.  She is doing 12,000 steps a day for exercise.  She still gets the dizzy spells but they only occur when she has been laying down and then goes to stand up and all of a sudden the room feels black and feels like she is going to be pulled down and has to sit down.  It only lasts a few seconds and resolves.  She has no prodromal symptoms of palpitations nausea or diaphoresis.  She never has this symptom at any  other time.  She has no palpitations.  Her neurologist felt that this was related to orthostatic hypotension.  She says she does crave salt a lot.  She is compliant with her meds and is tolerating meds with no SE.   The patient does not have symptoms concerning for COVID-19 infection (fever, chills, cough, or new shortness of breath).    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 Carotid US 3/15: mild soft plaque origin ICA. 1-39% ICA stenosis.   History of echocardiogram    a. Echo 3/15: Moderate LVH, focal basal hypertrophy, EF 60-65%, normal wall motion   History of nuclear stress test    a. Myoview 5/16: Normal perfusion, EF 78%, low risk   Hyperlipidemia    Hypertension    Obesity    Pneumonia    Seizures (HCC)    Stroke (HCC)    Transient cerebral ischemia    Vertigo, benign paroxysmal    Past Surgical History:  Procedure Laterality Date   ABDOMINAL HYSTERECTOMY     BREAST SURGERY     Bilateral mastectomy-TRAM   FRACTURE SURGERY     tib/fib (R) leg   LAPAROSCOPIC GASTRIC SLEEVE RESECTION  05/27/2016   MASTECTOMY       Current Meds  Medication Sig   aspirin EC 81 MG EC tablet Take 1 tablet (81 mg total) by mouth daily.   atorvastatin (LIPITOR) 80 MG tablet TAKE 1 TABLET BY MOUTH DAILY AT 6 PM.   clonazePAM (KLONOPIN) 1 MG tablet TAKE 1 TABLET BY MOUTH TWICE A DAY AS NEEDED FOR ANXIETY   DULoxetine (CYMBALTA) 30 MG capsule TAKE 1 CAPSULE BY MOUTH EVERY DAY   pantoprazole (PROTONIX) 40 MG tablet TAKE 1 TABLET TWICE A DAY   PHENobarbital (LUMINAL) 97.2 MG tablet TAKE 1 TABLET BY MOUTH EVERYDAY AT BEDTIME     Allergies:   Buprenorphine hcl; Hydromorphone; Meperidine; Metformin and related; Morphine and related; Pentazocine; Pentazocine lactate; Phenytoin; Demerol; Dilaudid [hydromorphone hcl]; Doxycycline hyclate; Ibuprofen; Phenytoin sodium extended; Codeine; and Doxycycline hyclate   Social History   Tobacco Use   Smoking status: Never Smoker   Smokeless tobacco: Never Used  Substance Use Topics   Alcohol use: No    Alcohol/week: 0.0 standard drinks   Drug use: No     Family  Hx: The patient's family history includes Alzheimer's disease in her father; Breast cancer in her maternal grandmother; Cervical cancer in her maternal grandmother; Colon cancer in her maternal aunt; Colon polyps in her brother, father, mother, and sister; Diabetes in her brother, father, paternal grandmother, and sister; Fragile X syndrome in her Ramsey; Heart attack in her mother; Heart failure in her maternal grandfather; Ovarian cancer in her maternal grandmother; Raynaud syndrome in her mother.  ROS:   Please see the history of present illness.     All other systems reviewed and are negative.   Labs/Other Tests and Data Reviewed:    Recent Labs: 05/31/2018: ALT 22; BUN 31; Creatinine, Ser  0.82; Potassium 4.6; Sodium 138   Recent Lipid Panel Lab Results  Component Value Date/Time   CHOL 144 05/31/2018 11:15 AM   TRIG 69.0 05/31/2018 11:15 AM   HDL 55.70 05/31/2018 11:15 AM   CHOLHDL 3 05/31/2018 11:15 AM   LDLCALC 74 05/31/2018 11:15 AM    Wt Readings from Last 3 Encounters:  10/22/18 145 lb (65.8 kg)  10/15/18 140 lb (63.5 kg)  05/31/18 142 lb (64.4 kg)     Objective:    Vital Signs:  BP 125/80 (Patient Position: Standing)    Pulse 98    Ht 5' 3.6" (1.615 m)    Wt 145 lb (65.8 kg)    BMI 25.20 kg/m    CONSTITUTIONAL:  Well nourished, well developed female in no acute distress.  EYES: anicteric MOUTH: oral mucosa is pink RESPIRATORY: Normal respiratory effort, symmetric expansion CARDIOVASCULAR: No peripheral edema SKIN: No rash, lesions or ulcers MUSCULOSKELETAL: no digital cyanosis NEURO: Cranial Nerves II-XII grossly intact, moves all extremities PSYCH: Intact judgement and insight.  A&O x 3, Mood/affect appropriate   ASSESSMENT & PLAN:    1.  Hyperlipidemia -LDL goal is less than 100.  This is managed by her PCP.  She will continue on atorvastatin 80 mg daily.  2.  Hypertension -BP is controlled on exam today.  She is not on any antihypertensive  medications.  3.  Diabetes mellitus type 2 -this is followed by her PCP.  She has not required any medication this is diet-controlled.   4.  Orthostatic hypotension -her symptoms of dizziness that only occur when changing positions from lying to standing are consistent with orthostatic hypotension.  I am not convinced she gets enough fluids daily or sodium in her diet.  These seem to occur after bowel surgery.  I recommended she drink at least 64 ounces of fluids a day and liberalize sodium intake.  I have also recommend that she try compression hose during the day to see if this helps as well.  She knows that she needs to get up slowly when she is been laying down.  5.  COVID-19 Education: The signs and symptoms of COVID-19 were discussed with the patient and how to seek care for testing (follow up with PCP or arrange E-visit).  The importance of social distancing was discussed today.  Patient Risk:   After full review of this patient's clinical status, I feel that they are at least moderate risk at this time.  Time:   Today, I have spent 20 minutes directly with the patient on video discussing medical problems including HTN, hyperlipidemia, DM.  We also reviewed the symptoms of COVID 19 and the ways to protect against contracting the virus with telehealth technology.  I spent an additional 5 minutes reviewing patient's chart including Neuro consult and PCP notes and 2D echo 2015.  Medication Adjustments/Labs and Tests Ordered: Current medicines are reviewed at length with the patient today.  Concerns regarding medicines are outlined above.  Tests Ordered: No orders of the defined types were placed in this encounter.  Medication Changes: No orders of the defined types were placed in this encounter.   Disposition:  Follow up 2 months  Signed, Fransico Him, MD  10/22/2018 2:12 PM    [place]

## 2018-10-22 ENCOUNTER — Other Ambulatory Visit: Payer: Self-pay

## 2018-10-22 ENCOUNTER — Telehealth (INDEPENDENT_AMBULATORY_CARE_PROVIDER_SITE_OTHER): Payer: PPO | Admitting: Cardiology

## 2018-10-22 VITALS — BP 125/80 | HR 98 | Ht 63.6 in | Wt 145.0 lb

## 2018-10-22 DIAGNOSIS — I129 Hypertensive chronic kidney disease with stage 1 through stage 4 chronic kidney disease, or unspecified chronic kidney disease: Secondary | ICD-10-CM

## 2018-10-22 DIAGNOSIS — Z7189 Other specified counseling: Secondary | ICD-10-CM | POA: Diagnosis not present

## 2018-10-22 DIAGNOSIS — I10 Essential (primary) hypertension: Secondary | ICD-10-CM | POA: Diagnosis not present

## 2018-10-22 DIAGNOSIS — I951 Orthostatic hypotension: Secondary | ICD-10-CM | POA: Diagnosis not present

## 2018-10-22 DIAGNOSIS — N181 Chronic kidney disease, stage 1: Secondary | ICD-10-CM

## 2018-10-22 DIAGNOSIS — E785 Hyperlipidemia, unspecified: Secondary | ICD-10-CM

## 2018-10-22 DIAGNOSIS — E1122 Type 2 diabetes mellitus with diabetic chronic kidney disease: Secondary | ICD-10-CM

## 2018-10-22 NOTE — Patient Instructions (Addendum)
Medication Instructions:  Your physician recommends that you continue on your current medications as directed. Please refer to the Current Medication list given to you today.  If you need a refill on your cardiac medications before your next appointment, please call your pharmacy.   Lab work: None If you have labs (blood work) drawn today and your tests are completely normal, you will receive your results only by: [name] MyChart Message (if you have MyChart) OR . A paper copy in the mail If you have any lab test that is abnormal or we need to change your treatment, we will call you to review the results.  Testing/Procedures: None  Follow-Up: 7/10 at 3:20 pm.

## 2018-10-24 ENCOUNTER — Other Ambulatory Visit: Payer: Self-pay | Admitting: Internal Medicine

## 2018-10-24 DIAGNOSIS — F320 Major depressive disorder, single episode, mild: Secondary | ICD-10-CM

## 2018-10-29 ENCOUNTER — Telehealth: Payer: Self-pay | Admitting: Neurology

## 2018-10-29 ENCOUNTER — Other Ambulatory Visit: Payer: Self-pay

## 2018-10-29 NOTE — Telephone Encounter (Signed)
Error

## 2018-11-01 ENCOUNTER — Ambulatory Visit: Payer: PPO | Admitting: Gynecology

## 2018-12-02 ENCOUNTER — Other Ambulatory Visit: Payer: Self-pay | Admitting: Internal Medicine

## 2018-12-30 ENCOUNTER — Encounter: Payer: Self-pay | Admitting: Cardiology

## 2018-12-31 ENCOUNTER — Telehealth: Payer: PPO | Admitting: Cardiology

## 2018-12-31 ENCOUNTER — Other Ambulatory Visit: Payer: Self-pay

## 2018-12-31 ENCOUNTER — Other Ambulatory Visit: Payer: Self-pay | Admitting: Family

## 2018-12-31 ENCOUNTER — Telehealth: Payer: Self-pay | Admitting: Internal Medicine

## 2018-12-31 NOTE — Telephone Encounter (Signed)
Appointment made for pt to see PCP.

## 2018-12-31 NOTE — Telephone Encounter (Signed)
Copied from [place] [id number]. Topic: General - Inquiry >> Dec 30, 2018 12:47 PM Scherrie Gerlach wrote: Reason for CRM: pt states she got a card from [place] that states senior citizens should get a flu shot end of July. Pt would like to know if the dr knows anything about this.  Also, pt states last December her and her husband went on a cruise, and when they returned home they both became sick. She is now wanting to ask if she could do the antibody testing. Also they want to see if you think they should get the covid test and if so would you order?

## 2019-01-04 ENCOUNTER — Ambulatory Visit (INDEPENDENT_AMBULATORY_CARE_PROVIDER_SITE_OTHER): Payer: PPO | Admitting: Internal Medicine

## 2019-01-04 DIAGNOSIS — I10 Essential (primary) hypertension: Secondary | ICD-10-CM

## 2019-01-04 NOTE — Progress Notes (Signed)
Virtual Visit via Video Note  I connected with Brandi Ramsey on 01/04/19 at 11:00 AM EDT by a video enabled telemedicine application and verified that I am speaking with the correct person using two identifiers.   I discussed the limitations of evaluation and management by telemedicine and the availability of in person appointments. The patient expressed understanding and agreed to proceed.  History of Present Illness:   error, not seen   Observations/Objective:   Lab Results  Component Value Date   WBC 6.0 07/24/2017   HGB 13.1 07/24/2017   HCT 38.8 07/24/2017   PLT 210 07/24/2017   GLUCOSE 83 05/31/2018   CHOL 144 05/31/2018   TRIG 69.0 05/31/2018   HDL 55.70 05/31/2018   LDLCALC 74 05/31/2018   ALT 22 05/31/2018   AST 26 05/31/2018   NA 138 05/31/2018   K 4.6 05/31/2018   CL 104 05/31/2018   CREATININE 0.82 05/31/2018   BUN 31 (H) 05/31/2018   CO2 26 05/31/2018   TSH 0.76 02/24/2017   HGBA1C 5.6 02/24/2017   MICROALBUR 1.4 09/05/2015     Assessment and Plan:   Follow Up Instructions:    I discussed the assessment and treatment plan with the patient. The patient was provided an opportunity to ask questions and all were answered. The patient agreed with the plan and demonstrated an understanding of the instructions.   The patient was advised to call back or seek an in-person evaluation if the symptoms worsen or if the condition fails to improve as anticipated.  I provided 0 minutes of non-face-to-face time during this encounter.   Scarlette Calico, MD

## 2019-01-05 ENCOUNTER — Ambulatory Visit (INDEPENDENT_AMBULATORY_CARE_PROVIDER_SITE_OTHER): Payer: PPO | Admitting: *Deleted

## 2019-01-05 ENCOUNTER — Ambulatory Visit: Payer: PPO | Admitting: Internal Medicine

## 2019-01-05 DIAGNOSIS — I10 Essential (primary) hypertension: Secondary | ICD-10-CM

## 2019-01-05 DIAGNOSIS — Z0000 Encounter for general adult medical examination without abnormal findings: Secondary | ICD-10-CM | POA: Diagnosis not present

## 2019-01-05 NOTE — Progress Notes (Signed)
Virtual Visit via Video Note  I connected with Brandi Ramsey on 01/05/19 at  4:00 PM EDT by a video enabled telemedicine application and verified that I am speaking with the correct person using two identifiers.   I discussed the limitations of evaluation and management by telemedicine and the availability of in person appointments. The patient expressed understanding and agreed to proceed.  History of Present Illness:    error, not seen  Observations/Objective:   Lab Results  Component Value Date   WBC 6.0 07/24/2017   HGB 13.1 07/24/2017   HCT 38.8 07/24/2017   PLT 210 07/24/2017   GLUCOSE 83 05/31/2018   CHOL 144 05/31/2018   TRIG 69.0 05/31/2018   HDL 55.70 05/31/2018   LDLCALC 74 05/31/2018   ALT 22 05/31/2018   AST 26 05/31/2018   NA 138 05/31/2018   K 4.6 05/31/2018   CL 104 05/31/2018   CREATININE 0.82 05/31/2018   BUN 31 (H) 05/31/2018   CO2 26 05/31/2018   TSH 0.76 02/24/2017   HGBA1C 5.6 02/24/2017   MICROALBUR 1.4 09/05/2015     Assessment and Plan:   Follow Up Instructions:    I discussed the assessment and treatment plan with the patient. The patient was provided an opportunity to ask questions and all were answered. The patient agreed with the plan and demonstrated an understanding of the instructions.   The patient was advised to call back or seek an in-person evaluation if the symptoms worsen or if the condition fails to improve as anticipated.  I provided 0 minutes of non-face-to-face time during this encounter.   Scarlette Calico, MD

## 2019-01-05 NOTE — Progress Notes (Addendum)
Subjective:   Brandi Ramsey is a 68 y.o. female who presents for Medicare Annual (Subsequent) preventive examination. I connected with patient by a telephone and verified that I am speaking with the correct person using two identifiers. Patient stated full name and DOB. Patient gave permission to continue with telephonic visit. Patient's location was at home and Nurse's location was at [place] office.   Review of Systems:   Cardiac Risk Factors include: advanced age (>32mn, >>33women);diabetes mellitus;dyslipidemia Sleep patterns: feels rested on waking, gets up 1-2 times nightly to void and sleeps 8 hours nightly.    Home Safety/Smoke Alarms: Feels safe in home. Smoke alarms in place.  Living environment; residence and Firearm Safety: 1-story house/ trailer. Lives with husband, no needs for DME, good support system Seat Belt Safety/Bike Helmet: Wears seat belt.     Objective:     Vitals: There were no vitals taken for this visit.  There is no height or weight on file to calculate BMI.  Advanced Directives 01/05/2019 10/15/2018 11/25/2017 11/18/2016 03/28/2015 03/09/2015 02/05/2015  Does Patient Have a Medical Advance Directive? No No No No No No No  Does patient want to make changes to medical advance directive? Yes (ED - Information included in AVS) - - Yes (ED - Information included in AVS) - - -  Would patient like information on creating a medical advance directive? - Yes (MAU/Ambulatory/Procedural Areas - Information given) Yes (ED - Information included in AVS) - Yes - Educational materials given - No - patient declined information  Pre-existing out of facility DNR order (yellow form or pink MOST form) - - - - - - -    Tobacco Social History   Tobacco Use  Smoking Status Never Smoker  Smokeless Tobacco Never Used     Counseling given: Not Answered  Past Medical History:  Diagnosis Date  . Anxiety   . Arthritis   . 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 Colon polyps   . Depression    . Diabetes mellitus without complication ([facility]   . Fibrocystic breast   . Fibromyalgia   . 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 Gastric ulcer   . Hemorrhoids   . History of Doppler ultrasound    a. Carotid [place]: mild soft plaque origin ICA. 1-39% ICA stenosis.  .Marland KitchenHistory of echocardiogram    a. Echo 3/15: Moderate LVH, focal basal hypertrophy, EF 60-65%, normal wall motion  . History of nuclear stress test    a. Myoview 5/16: Normal perfusion, EF 78%, low risk  . Hyperlipidemia   . Hypertension   . Obesity   . Pneumonia   . Seizures ([place]   . Stroke ([place]   . Transient cerebral ischemia   . Vertigo, benign paroxysmal    Past Surgical History:  Procedure Laterality Date  . ABDOMINAL HYSTERECTOMY    . BREAST SURGERY     Bilateral mastectomy-TRAM  . FRACTURE SURGERY     tib/fib (R) leg  . LAPAROSCOPIC GASTRIC SLEEVE RESECTION  05/27/2016  . MASTECTOMY     Family History  Problem Relation Age of Onset  . Raynaud syndrome Mother   . Colon polyps Mother   . Heart attack Mother   . Alzheimer's disease Father   . Diabetes Father   . Colon polyps Father   . Fragile X syndrome Son        Patient is carrier  . Diabetes Sister   . Diabetes Brother   .  Cervical cancer Maternal Grandmother   . Breast cancer Maternal Grandmother   . Ovarian cancer Maternal Grandmother   . Heart failure Maternal Grandfather   . Diabetes Paternal Grandmother   . Colon polyps Sister   . Colon polyps Brother   . Colon cancer Maternal Aunt    Social History   Socioeconomic History  . Marital status: Married    Spouse name: Vira Agar  . Number of children: 2  . Years of education: Not on file  . Highest education level: Not on file  Occupational History  . Occupation: retired  Scientific laboratory technician  . Financial resource strain: Not hard at all  . Food insecurity    Worry: Never true    Inability: Never true  . Transportation needs    Medical: No    Non-medical: No  Tobacco  Use  . Smoking status: Never Smoker  . Smokeless tobacco: Never Used  Substance and Sexual Activity  . Alcohol use: No    Alcohol/week: 0.0 standard drinks  . Drug use: No  . Sexual activity: Yes    Partners: Male    Comment: 1st intercourse 68 yo-Fewer than 5 partners  Lifestyle  . Physical activity    Days per week: 5 days    Minutes per session: 50 min  . Stress: Very much  Relationships  . Social connections    Talks on phone: More than three times a week    Gets together: More than three times a week    Attends religious service: More than 4 times per year    Active member of club or organization: Yes    Attends meetings of clubs or organizations: More than 4 times per year    Relationship status: Married  Other Topics Concern  . 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 acetaminophen (TYLENOL) 500 MG tablet Take 500 mg by mouth every 6 (six) hours as needed.  [name] aspirin EC 81 MG EC tablet Take 1 tablet (81 mg total) by mouth daily.  [name] atorvastatin (LIPITOR) 80 MG tablet TAKE 1 TABLET BY MOUTH DAILY AT 6 PM.  . clonazePAM (KLONOPIN) 1 MG tablet TAKE 1 TABLET BY MOUTH TWICE A DAY AS NEEDED FOR ANXIETY  . DULoxetine (CYMBALTA) 30 MG capsule TAKE 1 CAPSULE BY MOUTH EVERY DAY  . ibuprofen (ADVIL) 200 MG tablet Take 200 mg by mouth every 6 (six) hours as needed.  . Multiple Vitamin (MULTIVITAMIN) tablet Take 1 tablet by mouth daily.  . pantoprazole (PROTONIX) 40 MG tablet TAKE 1 TABLET TWICE A DAY  . PHENobarbital (LUMINAL) 97.2 MG tablet TAKE 1 TABLET BY MOUTH EVERYDAY AT BEDTIME  . meclizine (ANTIVERT) 12.5 MG tablet TAKE 1 TABLET BY MOUTH 3 (THREE) TIMES DAILY AS NEEDED FOR DIZZINESS.   No facility-administered encounter medications on file as of 01/05/2019.     Activities of Daily Living In your present state of health,  do you have any difficulty performing the following activities: 01/05/2019  Hearing? N  Vision? N  Difficulty concentrating or making decisions? N  Walking or climbing stairs? N  Dressing or bathing? N  Doing errands, shopping? N  Preparing Food and eating ? N  Using the Toilet? N  In the past six months, have you accidently leaked urine? N  Do you have problems with loss  of bowel control? N  Managing your Medications? N  Managing your Finances? N  Housekeeping or managing your Housekeeping? N  Some recent data might be hidden    Patient Care Team: Janith Lima, MD as PCP - General (Internal Medicine) Pieter Partridge, DO as Consulting Physician (Neurology) Exie Parody, OD as Physician Assistant (Optometry) Fontaine, Belinda Block, MD as Consulting Physician (Gynecology) Sueanne Margarita, MD as Consulting Physician (Cardiology)    Assessment:   This is a routine wellness examination for [place]. Physical assessment deferred to PCP.  Exercise Activities and Dietary recommendations Current Exercise Habits: Home exercise routine, Type of exercise: walking, Time (Minutes): 60, Frequency (Times/Week): 6, Weekly Exercise (Minutes/Week): 360, Intensity: Mild, Exercise limited by: None identified  Diet (meal preparation, eat out, water intake, caffeinated beverages, dairy products, fruits and vegetables): in general, a "healthy" diet  , well balanced. eats a variety of fruits and vegetables daily, limits salt, fat/cholesterol, sugar,carbohydrates,caffeine, drinks 6-8 glasses of water daily.  Goals    . Enjoy life, be healthy, and stay on top of my health.     Be there for my family and travel    . Patient Stated     [name] and love my family and grandchildren. Enjoy the upcoming [place] trip with my grandchildren and husband.       Fall Risk Fall Risk  01/05/2019 10/15/2018 11/25/2017 10/16/2017 06/18/2017  Falls in the past year? 0 1 No Yes Yes  Number falls in past yr: 0 1 - 2  or more 1  Injury with Fall? - 1 - No Yes  Risk Factor Category  - - - High Fall Risk -  Risk for fall due to : - - - - -  Follow up - Falls evaluation completed - - -    Depression Screen PHQ 2/9 Scores 01/05/2019 11/25/2017 06/18/2017 11/18/2016  PHQ - 2 Score 4 3 0 2  PHQ- 9 Score 6 7 - 4     Cognitive Function MMSE - Mini Mental State Exam 10/10/2014  Orientation to time 5  Orientation to Place 5  Registration 3  Attention/ Calculation 5  Recall 3  Language- name 2 objects 2  Language- repeat 1  Language- follow 3 step command 3  Language- read & follow direction 1  Write a sentence 1  Copy design 1  Total score 30       Ad8 score reviewed for issues:  Issues making decisions: no  Less interest in hobbies / activities: no  Repeats questions, stories (family complaining): no  Trouble using ordinary gadgets (microwave, computer, phone):no  Forgets the month or year: no  Mismanaging finances: no  Remembering appts: no  Daily problems with thinking and/or memory: no Ad8 score is= 0  Immunization History  Administered Date(s) Administered  . Influenza, High Dose Seasonal PF 02/24/2017, 03/31/2018  . Influenza,inj,Quad PF,6+ Mos 02/21/2016  . Influenza-Unspecified 03/20/2014, 04/03/2015  . MMR 11/27/2017  . Pneumococcal Conjugate-13 11/25/2017  . Pneumococcal-Unspecified 03/28/2013  . Tdap 09/05/2015  . Zoster 06/23/2012   Screening Tests Health Maintenance  Topic Date Due  . OPHTHALMOLOGY EXAM  09/03/2016  . URINE MICROALBUMIN  09/04/2016  . HEMOGLOBIN A1C  08/24/2017  . COLONOSCOPY  09/10/2018  . FOOT EXAM  11/26/2018  . PNA vac Low Risk Adult (2 of 2 - PPSV23) 11/26/2018  . INFLUENZA VACCINE  01/22/2019  . MAMMOGRAM  03/20/2019  . TETANUS/TDAP  09/04/2025  . DEXA SCAN  Completed  . Hepatitis C  Screening  Completed       Plan:    Reviewed health maintenance screenings with patient today and relevant education, vaccines, and/or referrals were  provided.   Continue to eat heart healthy diet (full of fruits, vegetables, whole grains, lean protein, water--limit salt, fat, and sugar intake) and increase physical activity as tolerated.  Continue doing brain stimulating activities (puzzles, reading, adult coloring books, staying active) to keep memory sharp.   I have personally reviewed and noted the following in the patient's chart:   . Medical and social history . Use of alcohol, tobacco or illicit drugs  . Current medications and supplements . Functional ability and status . Nutritional status . Physical activity . Advanced directives . List of other physicians . Screenings to include cognitive, depression, and falls . Referrals and appointments  In addition, I have reviewed and discussed with patient certain preventive protocols, quality metrics, and best practice recommendations. A written personalized care plan for preventive services as well as general preventive health recommendations were provided to patient.     Michiel Cowboy, RN  01/05/2019  Medical screening examination/treatment/procedure(s) were performed by non-physician practitioner and as supervising physician I was immediately available for consultation/collaboration. I agree with above. Scarlette Calico, MD

## 2019-01-05 NOTE — Patient Instructions (Addendum)
Continue doing brain stimulating activities (puzzles, reading, adult coloring books, staying active) to keep memory sharp.   Continue to eat heart healthy diet (full of fruits, vegetables, whole grains, lean protein, water--limit salt, fat, and sugar intake) and increase physical activity as tolerated.   [name] , Thank you for taking time to come for your Medicare Wellness Visit. I appreciate your ongoing commitment to your health goals. Please review the following plan we discussed and let me know if I can assist you in the future.   These are the goals we discussed: Goals    . Enjoy life, be healthy, and stay on top of my health.     Be there for my family and travel    . Patient Stated     Brandi Ramsey and love my family and grandchildren.       Preventive Care 98 Years and Older, Female Preventive care refers to lifestyle choices and visits with your health care provider that can promote health and wellness. This includes:  A yearly physical exam. This is also called an annual well check.  Regular dental and eye exams.  Immunizations.  Screening for certain conditions.  Healthy lifestyle choices, such as diet and exercise. What can I expect for my preventive care visit? Physical exam Your health care provider will check:  Height and weight. These may be used to calculate body mass index (BMI), which is a measurement that tells if you are at a healthy weight.  Heart rate and blood pressure.  Your skin for abnormal spots. Counseling Your health care provider may ask you questions about:  Alcohol, tobacco, and drug use.  Emotional well-being.  Home and relationship well-being.  Sexual activity.  Eating habits.  History of falls.  Memory and ability to understand (cognition).  Work and work Statistician.  Pregnancy and menstrual history. What immunizations do I need?  Influenza (flu) vaccine  This is recommended every year. Tetanus, diphtheria, and pertussis  (Tdap) vaccine  You may need a Td booster every 10 years. Varicella (chickenpox) vaccine  You may need this vaccine if you have not already been vaccinated. Zoster (shingles) vaccine  You may need this after age 67. Pneumococcal conjugate (PCV13) vaccine  One dose is recommended after age 67. Pneumococcal polysaccharide (PPSV23) vaccine  One dose is recommended after age 67. Measles, mumps, and rubella (MMR) vaccine  You may need at least one dose of MMR if you were born in 1957 or later. You may also need a second dose. Meningococcal conjugate (MenACWY) vaccine  You may need this if you have certain conditions. Hepatitis A vaccine  You may need this if you have certain conditions or if you travel or work in places where you may be exposed to hepatitis A. Hepatitis B vaccine  You may need this if you have certain conditions or if you travel or work in places where you may be exposed to hepatitis B. Haemophilus influenzae type b (Hib) vaccine  You may need this if you have certain conditions. You may receive vaccines as individual doses or as more than one vaccine together in one shot (combination vaccines). Talk with your health care provider about the risks and benefits of combination vaccines. What tests do I need? Blood tests  Lipid and cholesterol levels. These may be checked every 5 years, or more frequently depending on your overall health.  Hepatitis C test.  Hepatitis B test. Screening  Lung cancer screening. You may have this screening every year starting  at age 67 if you have a 30-pack-year history of smoking and currently smoke or have quit within the past 15 years.  Colorectal cancer screening. All adults should have this screening starting at age 67 and continuing until age 80. Your health care provider may recommend screening at age 26 if you are at increased risk. You will have tests every 1-10 years, depending on your results and the type of screening  test.  Diabetes screening. This is done by checking your blood sugar (glucose) after you have not eaten for a while (fasting). You may have this done every 1-3 years.  Mammogram. This may be done every 1-2 years.   Talk with your health care provider about how often you should have regular mammograms.  BRCA-related cancer screening. This may be done if you have a family history of breast, ovarian, tubal, or peritoneal cancers. Other tests  Sexually transmitted disease (STD) testing.  Bone density scan. This is done to screen for osteoporosis. You may have this done starting at age 67. Follow these instructions at home: Eating and drinking  Eat a diet that includes fresh fruits and vegetables, whole grains, lean protein, and low-fat dairy products. Limit your intake of foods with high amounts of sugar, saturated fats, and salt.  Take vitamin and mineral supplements as recommended by your health care provider.  Do not drink alcohol if your health care provider tells you not to drink.  If you drink alcohol: ? Limit how much you have to 0-1 drink a day. ? Be aware of how much alcohol is in your drink. In the [place] one drink equals one 12 oz bottle of beer (355 mL), one 5 oz glass of Nestor Wieneke (148 mL), or one 1 oz glass of hard liquor (44 mL). Lifestyle  Take daily care of your teeth and gums.  Stay active. Exercise for at least 30 minutes on 5 or more days each week.  Do not use any products that contain nicotine or tobacco, such as cigarettes, e-cigarettes, and chewing tobacco. If you need help quitting, ask your health care provider.  If you are sexually active, practice safe sex. Use a condom or other form of protection in order to prevent STIs (sexually transmitted infections).  Talk with your health care provider about taking a low-dose aspirin or statin. What's next?  Go to your health care provider once a year for a well check visit.  Ask your health care provider how often you  should have your eyes and teeth checked.  Stay up to date on all vaccines. This information is not intended to replace advice given to you by your health care provider. Make sure you discuss any questions you have with your health care provider. Document Released: 07/06/2015 Document Revised: 06/03/2018 Document Reviewed: 06/03/2018 Elsevier Patient Education  [employer].   This is a list of the screening recommended for you and due dates:  Health Maintenance  Topic Date Due  . Eye exam for diabetics  09/03/2016  . Urine Protein Check  09/04/2016  . Hemoglobin A1C  08/24/2017  . Colon Cancer Screening  09/10/2018  . Complete foot exam   11/26/2018  . Pneumonia vaccines (2 of 2 - PPSV23) 11/26/2018  . Flu Shot  01/22/2019  . Mammogram  03/20/2019  . Tetanus Vaccine  09/04/2025  . DEXA scan (bone density measurement)  Completed  .  Hepatitis C: One time screening is recommended by Center for Disease Control  (CDC) for  adults born from [birth year]  through 1965.   Completed

## 2019-01-10 ENCOUNTER — Ambulatory Visit: Payer: PPO

## 2019-01-17 ENCOUNTER — Ambulatory Visit (INDEPENDENT_AMBULATORY_CARE_PROVIDER_SITE_OTHER): Payer: PPO | Admitting: Internal Medicine

## 2019-01-17 ENCOUNTER — Ambulatory Visit (INDEPENDENT_AMBULATORY_CARE_PROVIDER_SITE_OTHER)
Admission: RE | Admit: 2019-01-17 | Discharge: 2019-01-17 | Disposition: A | Discharge status: Home / Self Care | Payer: PPO | Source: Ambulatory Visit | Attending: Internal Medicine | Admitting: Internal Medicine

## 2019-01-17 ENCOUNTER — Encounter: Payer: Self-pay | Admitting: Internal Medicine

## 2019-01-17 ENCOUNTER — Other Ambulatory Visit (INDEPENDENT_AMBULATORY_CARE_PROVIDER_SITE_OTHER): Payer: PPO

## 2019-01-17 ENCOUNTER — Other Ambulatory Visit: Payer: Self-pay

## 2019-01-17 VITALS — BP 120/80 | HR 78 | Temp 98.3°F | Resp 16 | Ht 63.6 in | Wt 145.0 lb

## 2019-01-17 DIAGNOSIS — R7303 Prediabetes: Secondary | ICD-10-CM | POA: Diagnosis not present

## 2019-01-17 DIAGNOSIS — Z23 Encounter for immunization: Secondary | ICD-10-CM | POA: Diagnosis not present

## 2019-01-17 DIAGNOSIS — Z419 Encounter for procedure for purposes other than remedying health state, unspecified: Secondary | ICD-10-CM | POA: Diagnosis not present

## 2019-01-17 DIAGNOSIS — I129 Hypertensive chronic kidney disease with stage 1 through stage 4 chronic kidney disease, or unspecified chronic kidney disease: Secondary | ICD-10-CM | POA: Diagnosis not present

## 2019-01-17 DIAGNOSIS — Q992 Fragile X chromosome: Secondary | ICD-10-CM | POA: Diagnosis not present

## 2019-01-17 DIAGNOSIS — N181 Chronic kidney disease, stage 1: Secondary | ICD-10-CM

## 2019-01-17 DIAGNOSIS — E1122 Type 2 diabetes mellitus with diabetic chronic kidney disease: Secondary | ICD-10-CM

## 2019-01-17 DIAGNOSIS — R112 Nausea with vomiting, unspecified: Secondary | ICD-10-CM | POA: Diagnosis not present

## 2019-01-17 DIAGNOSIS — G250 Essential tremor: Secondary | ICD-10-CM

## 2019-01-17 DIAGNOSIS — G40309 Generalized idiopathic epilepsy and epileptic syndromes, not intractable, without status epilepticus: Secondary | ICD-10-CM

## 2019-01-17 DIAGNOSIS — R1032 Left lower quadrant pain: Secondary | ICD-10-CM

## 2019-01-17 DIAGNOSIS — Z9884 Bariatric surgery status: Secondary | ICD-10-CM

## 2019-01-17 DIAGNOSIS — I10 Essential (primary) hypertension: Secondary | ICD-10-CM

## 2019-01-17 DIAGNOSIS — E559 Vitamin D deficiency, unspecified: Secondary | ICD-10-CM

## 2019-01-17 DIAGNOSIS — G119 Hereditary ataxia, unspecified: Secondary | ICD-10-CM

## 2019-01-17 DIAGNOSIS — G35 Multiple sclerosis: Secondary | ICD-10-CM | POA: Diagnosis not present

## 2019-01-17 DIAGNOSIS — K449 Diaphragmatic hernia without obstruction or gangrene: Secondary | ICD-10-CM | POA: Diagnosis not present

## 2019-01-17 DIAGNOSIS — R109 Unspecified abdominal pain: Secondary | ICD-10-CM | POA: Diagnosis not present

## 2019-01-17 LAB — CBC WITH DIFFERENTIAL/PLATELET
Basophils Absolute: 0 10*3/uL (ref 0.0–0.1)
Basophils Relative: 0.4 % (ref 0.0–3.0)
Eosinophils Absolute: 0 10*3/uL (ref 0.0–0.7)
Eosinophils Relative: 0.7 % (ref 0.0–5.0)
HCT: 42.8 % (ref 36.0–46.0)
Hemoglobin: 14.1 g/dL (ref 12.0–15.0)
Lymphocytes Relative: 22.9 % (ref 12.0–46.0)
Lymphs Abs: 1.6 10*3/uL (ref 0.7–4.0)
MCHC: 33.1 g/dL (ref 30.0–36.0)
MCV: 86.6 fl (ref 78.0–100.0)
Monocytes Absolute: 0.5 10*3/uL (ref 0.1–1.0)
Monocytes Relative: 7.3 % (ref 3.0–12.0)
Neutro Abs: 4.9 10*3/uL (ref 1.4–7.7)
Neutrophils Relative %: 68.7 % (ref 43.0–77.0)
Platelets: 241 10*3/uL (ref 150.0–400.0)
RBC: 4.94 Mil/uL (ref 3.87–5.11)
RDW: 13.1 % (ref 11.5–15.5)
WBC: 7.1 10*3/uL (ref 4.0–10.5)

## 2019-01-17 LAB — URINALYSIS, ROUTINE W REFLEX MICROSCOPIC
Bilirubin Urine: NEGATIVE
Hgb urine dipstick: NEGATIVE
Ketones, ur: NEGATIVE
Leukocytes,Ua: NEGATIVE
Nitrite: NEGATIVE
Specific Gravity, Urine: 1.01 (ref 1.000–1.030)
Total Protein, Urine: NEGATIVE
Urine Glucose: NEGATIVE
Urobilinogen, UA: 0.2 (ref 0.0–1.0)
pH: 6 (ref 5.0–8.0)

## 2019-01-17 LAB — BASIC METABOLIC PANEL
BUN: 28 mg/dL — ABNORMAL HIGH (ref 6–23)
CO2: 24 mEq/L (ref 19–32)
Calcium: 9.1 mg/dL (ref 8.4–10.5)
Chloride: 104 mEq/L (ref 96–112)
Creatinine, Ser: 0.81 mg/dL (ref 0.40–1.20)
GFR: 70.52 mL/min (ref >60.00)
Glucose, Bld: 99 mg/dL (ref 70–99)
Potassium: 4.5 mEq/L (ref 3.5–5.1)
Sodium: 137 mEq/L (ref 135–145)

## 2019-01-17 LAB — VITAMIN D 25 HYDROXY (VIT D DEFICIENCY, FRACTURES): VITD: 34.55 ng/mL (ref 30.00–100.00)

## 2019-01-17 LAB — POCT GLYCOSYLATED HEMOGLOBIN (HGB A1C): Hemoglobin A1C: 5.6 % (ref 4.0–5.6)

## 2019-01-17 LAB — VITAMIN B12: Vitamin B-12: 1462 pg/mL — ABNORMAL HIGH (ref 211–911)

## 2019-01-17 LAB — FOLATE: Folate: >23.9 ng/mL (ref >5.9)

## 2019-01-17 LAB — MICROALBUMIN / CREATININE URINE RATIO
Creatinine,U: 40.7 mg/dL
Microalb Creat Ratio: 1.7 mg/g (ref 0.0–30.0)
Microalb, Ur: <0.7 mg/dL (ref 0.0–1.9)

## 2019-01-17 LAB — IBC PANEL
Iron: 110 ug/dL (ref 42–145)
Saturation Ratios: 29.1 % (ref 20.0–50.0)
Transferrin: 270 mg/dL (ref 212.0–360.0)

## 2019-01-17 LAB — SEDIMENTATION RATE: Sed Rate: 21 mm/hr (ref 0–30)

## 2019-01-17 LAB — FERRITIN: Ferritin: 23.2 ng/mL (ref 10.0–291.0)

## 2019-01-17 NOTE — Progress Notes (Signed)
Subjective:  Patient ID: [name], female    DOB: October 21, 1950  Age: 68 y.o. MRN: [id number]  CC: Abdominal Pain   HPI HADASSA CERMAK presents for f/up - She complains of a several week history of intermittent sharp pain that comes and goes.  The pain is located in the left lower quadrant and the left flank.  She is not aware of any aggravating or alleviating factors.  She denies nausea, vomiting, diarrhea, constipation, bloody stool, melena, dysuria, or hematuria.  She gets symptom relief with acetaminophen and ibuprofen.  She wants to be tested for COVID-19 antibodies.  Outpatient Medications Prior to Visit  Medication Sig Dispense Refill  . acetaminophen (TYLENOL) 500 MG tablet Take 500 mg by mouth every 6 (six) hours as needed.    [name] atorvastatin (LIPITOR) 80 MG tablet TAKE 1 TABLET BY MOUTH DAILY AT 6 PM. 90 tablet 1  . clonazePAM (KLONOPIN) 1 MG tablet TAKE 1 TABLET BY MOUTH TWICE A DAY AS NEEDED FOR ANXIETY 45 tablet 2  . DULoxetine (CYMBALTA) 30 MG capsule TAKE 1 CAPSULE BY MOUTH EVERY DAY 90 capsule 1  . ibuprofen (ADVIL) 200 MG tablet Take 200 mg by mouth every 6 (six) hours as needed.    . meclizine (ANTIVERT) 12.5 MG tablet TAKE 1 TABLET BY MOUTH 3 (THREE) TIMES DAILY AS NEEDED FOR DIZZINESS.    . Multiple Vitamin (MULTIVITAMIN) tablet Take 1 tablet by mouth daily.    . pantoprazole (PROTONIX) 40 MG tablet TAKE 1 TABLET TWICE A DAY 180 tablet 1  . PHENobarbital (LUMINAL) 97.2 MG tablet TAKE 1 TABLET BY MOUTH EVERYDAY AT BEDTIME 90 tablet 0  . aspirin EC 81 MG EC tablet Take 1 tablet (81 mg total) by mouth daily.     No facility-administered medications prior to visit.     ROS Review of Systems  Constitutional: Negative.  Negative for appetite change, chills, diaphoresis, fatigue, fever and unexpected weight change.  HENT: Negative.  Negative for trouble swallowing.   Eyes: Negative for visual disturbance.  Respiratory: Negative for cough, chest tightness, shortness of  breath and wheezing.   Cardiovascular: Negative for chest pain, palpitations and leg swelling.  Gastrointestinal: Positive for abdominal pain. Negative for blood in stool, constipation, diarrhea, nausea and vomiting.  Genitourinary: Positive for flank pain. Negative for decreased urine volume, difficulty urinating, dysuria, hematuria, pelvic pain, urgency, vaginal bleeding and vaginal discharge.  Musculoskeletal: Negative for arthralgias, back pain and myalgias.  Skin: Negative.  Negative for color change, pallor and rash.  Neurological: Negative.  Negative for dizziness, weakness, light-headedness and headaches.  Hematological: Negative for adenopathy. Does not bruise/bleed easily.  Psychiatric/Behavioral: Negative.     Objective:  BP 120/80 (BP Location: Left Arm, Patient Position: Sitting, Cuff Size: Normal)   Pulse 78   Temp 98.3 F (36.8 C) (Oral)   Resp 16   Ht 5' 3.6" (1.615 m)   Wt 145 lb (65.8 kg)   SpO2 97%   BMI 25.20 kg/m   BP Readings from Last 3 Encounters:  01/17/19 120/80  10/22/18 125/80  05/31/18 114/64    Wt Readings from Last 3 Encounters:  01/17/19 145 lb (65.8 kg)  10/22/18 145 lb (65.8 kg)  10/15/18 140 lb (63.5 kg)    Physical Exam Vitals signs reviewed.  Constitutional:      General: She is not in acute distress.    Appearance: She is not ill-appearing, toxic-appearing or diaphoretic.  HENT:     Mouth/Throat:  Mouth: Mucous membranes are moist.     Pharynx: No pharyngeal swelling.  Eyes:     General: No scleral icterus. Cardiovascular:     Rate and Rhythm: Normal rate and regular rhythm.     Heart sounds: No murmur.  Pulmonary:     Effort: Pulmonary effort is normal. No respiratory distress.     Breath sounds: No stridor. No wheezing, rhonchi or rales.  Abdominal:     General: Abdomen is flat. Bowel sounds are normal. There is no distension.     Palpations: Abdomen is soft. There is no hepatomegaly, splenomegaly or mass.      Tenderness: There is abdominal tenderness in the left lower quadrant. There is no right CVA tenderness, left CVA tenderness or guarding. Negative signs include obturator sign.     Hernia: No hernia is present.  Musculoskeletal: Normal range of motion.        General: No swelling.     Right lower leg: No edema.     Left lower leg: No edema.  Skin:    General: Skin is warm and dry.     Findings: No rash.  Neurological:     General: No focal deficit present.     Mental Status: She is alert and oriented to person, place, and time. Mental status is at baseline.  Psychiatric:        Mood and Affect: Mood normal.        Behavior: Behavior normal.     Lab Results  Component Value Date   WBC 7.1 01/17/2019   HGB 14.1 01/17/2019   HCT 42.8 01/17/2019   PLT 241.0 01/17/2019   GLUCOSE 99 01/17/2019   CHOL 144 05/31/2018   TRIG 69.0 05/31/2018   HDL 55.70 05/31/2018   LDLCALC 74 05/31/2018   ALT 22 05/31/2018   AST 26 05/31/2018   NA 137 01/17/2019   K 4.5 01/17/2019   CL 104 01/17/2019   CREATININE 0.81 01/17/2019   BUN 28 (H) 01/17/2019   CO2 24 01/17/2019   TSH 0.76 02/24/2017   HGBA1C 5.6 01/17/2019   MICROALBUR <0.7 01/17/2019    Mm Digital Screening Bilateral  Result Date: 03/19/2017 CLINICAL DATA:  Screening. EXAM: DIGITAL SCREENING BILATERAL MAMMOGRAM WITH CAD COMPARISON:  None. ACR Breast Density Category a: The breast tissue is almost entirely fatty. FINDINGS: There are no findings suspicious for malignancy. Images were processed with CAD. IMPRESSION: No mammographic evidence of malignancy. A result letter of this screening mammogram will be mailed directly to the patient. RECOMMENDATION: Screening mammogram in one year. (Code:SM-B-01Y) BI-RADS CATEGORY  1: Negative. Electronically Signed   By: Ammie Ferrier M.D.   On: 03/19/2017 16:52   Dg Abd Acute 2+v W 1v Chest  Result Date: 01/18/2019 CLINICAL DATA:  LEFT lower abdominal pain radiating to back with some nausea  and vomiting, intermittent constipation, history of diabetes mellitus, fibromyalgia, hypertension EXAM: DG ABDOMEN ACUTE W/ 1V CHEST COMPARISON:  Chest radiographs 07/24/2017 FINDINGS: Normal heart size, mediastinal contours, and pulmonary vascularity. Lungs clear. No infiltrate, pleural effusion or pneumothorax. Chronic eventration RIGHT diaphragm. Nonobstructive bowel gas pattern. Tiny hiatal hernia. No bowel dilatation, bowel wall thickening, or free air. Question prior gastric surgery. No urinary tract calcification. Bones demineralized with levoconvex thoracolumbar scoliosis. IMPRESSION: No acute abnormalities. Tiny hiatal hernia. Electronically Signed   By: Lavonia Dana M.D.   On: 01/18/2019 08:26    Assessment & Plan:   Bradee was seen today for abdominal pain.  Diagnoses and all orders  for this visit:  Status post laparoscopic sleeve gastrectomy- I will monitor her for vitamin deficiencies. -     Vitamin B12; Future -     IBC panel; Future -     VITAMIN D 25 Hydroxy (Vit-D Deficiency, Fractures); Future -     Vitamin B1; Future -     Zinc; Future -     Folate; Future -     Ferritin; Future  Essential (primary) hypertension- Her blood pressure is adequately well controlled.  Electrolytes are normal. 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 -     POCT glycosylated hemoglobin (Hb A1C) -     Urine Microalbumin w/creat. ratio; Future  Avitaminosis D- Her vitamin D level is in the normal range. -     VITAMIN D 25 Hydroxy (Vit-D Deficiency, Fractures); Future  Prediabetes -     Cancel: Hemoglobin A1c; Future  Left lower quadrant abdominal pain- She has had intermittent pain.  Her exam is remarkable for tenderness but no acute abdominal process.  Plain films are unremarkable.  Labs are reassuring that she does not have a  serious or systemic illness.  I await the results of her urine culture.  If the pain continues then I will recommend that she undergo a CT scan of the abdomen and pelvis with contrast. -     Sedimentation rate; Future -     Urinalysis, Routine w reflex microscopic; Future -     CULTURE, URINE COMPREHENSIVE; Future -     DG ABD ACUTE 2+V W 1V CHEST; Future  Need for pneumococcal vaccination -     Pneumococcal polysaccharide vaccine 23-valent greater than or equal to 2yo subcutaneous/IM  Patient-requested procedure -     Cancel: SAR CoV2 Serology (COVID 19)AB(IGG)IA -     SAR CoV2 Serology (COVID 19)AB(IGG)IA; Future   I have discontinued [name]. Reamer's aspirin. I am also having her maintain her atorvastatin, pantoprazole, clonazePAM, DULoxetine, PHENobarbital, meclizine, multivitamin, acetaminophen, and ibuprofen.  No orders of the defined types were placed in this encounter.    Follow-up: Return in about 3 weeks (around 02/07/2019).  Scarlette Calico, MD

## 2019-01-17 NOTE — Patient Instructions (Signed)
Abdominal Pain, Adult Abdominal pain can be caused by many things. Often, abdominal pain is not serious and it gets better with no treatment or by being treated at home. However, sometimes abdominal pain is serious. Your health care provider will do a medical history and a physical exam to try to determine the cause of your abdominal pain. Follow these instructions at home:  Take over-the-counter and prescription medicines only as told by your health care provider. Do not take a laxative unless told by your health care provider.  Drink enough fluid to keep your urine clear or pale yellow.  Watch your condition for any changes.  Keep all follow-up visits as told by your health care provider. This is important. Contact a health care provider if:  Your abdominal pain changes or gets worse.  You are not hungry or you lose weight without trying.  You are constipated or have diarrhea for more than 2-3 days.  You have pain when you urinate or have a bowel movement.  Your abdominal pain wakes you up at night.  Your pain gets worse with meals, after eating, or with certain foods.  You are throwing up and cannot keep anything down.  You have a fever. Get help right away if:  Your pain does not go away as soon as your health care provider told you to expect.  You cannot stop throwing up.  Your pain is only in areas of the abdomen, such as the right side or the left lower portion of the abdomen.  You have bloody or black stools, or stools that look like tar.  You have severe pain, cramping, or bloating in your abdomen.  You have signs of dehydration, such as: ? Dark urine, very little urine, or no urine. ? Cracked lips. ? Dry mouth. ? Sunken eyes. ? Sleepiness. ? Weakness. This information is not intended to replace advice given to you by your health care provider. Make sure you discuss any questions you have with your health care provider. Document Released: 03/19/2005 Document  Revised: 12/28/2015 Document Reviewed: 11/21/2015 Elsevier Interactive Patient Education  2020 [employer]vier Inc[employer]  

## 2019-01-18 ENCOUNTER — Encounter: Payer: Self-pay | Admitting: Internal Medicine

## 2019-01-18 LAB — SAR COV2 SEROLOGY (COVID19)AB(IGG),IA: SARS CoV2 AB IGG: NEGATIVE

## 2019-01-19 ENCOUNTER — Encounter: Payer: Self-pay | Admitting: Internal Medicine

## 2019-01-20 ENCOUNTER — Telehealth (INDEPENDENT_AMBULATORY_CARE_PROVIDER_SITE_OTHER): Payer: PPO | Admitting: Cardiology

## 2019-01-20 ENCOUNTER — Other Ambulatory Visit: Payer: Self-pay

## 2019-01-20 ENCOUNTER — Encounter: Payer: Self-pay | Admitting: Internal Medicine

## 2019-01-20 ENCOUNTER — Encounter: Payer: Self-pay | Admitting: Cardiology

## 2019-01-20 VITALS — Ht 63.6 in | Wt 145.0 lb

## 2019-01-20 DIAGNOSIS — E785 Hyperlipidemia, unspecified: Secondary | ICD-10-CM

## 2019-01-20 DIAGNOSIS — I951 Orthostatic hypotension: Secondary | ICD-10-CM | POA: Diagnosis not present

## 2019-01-20 DIAGNOSIS — R7303 Prediabetes: Secondary | ICD-10-CM | POA: Diagnosis not present

## 2019-01-20 HISTORY — DX: Orthostatic hypotension: I95.1

## 2019-01-20 NOTE — Progress Notes (Signed)
Virtual Visit via Video Note   This visit type was conducted due to national recommendations for restrictions regarding the COVID-19 Pandemic (e.g. social distancing) in an effort to limit this patient's exposure and mitigate transmission in our community.  Due to her co-morbid illnesses, this patient is at least at moderate risk for complications without adequate follow up.  This format is felt to be most appropriate for this patient at this time.  All issues noted in this document were discussed and addressed.  A limited physical exam was performed with this format.  Please refer to the patient's chart for her consent to telehealth for [facility].  Evaluation Performed:  Follow-up visit  This visit type was conducted due to national recommendations for restrictions regarding the COVID-19 Pandemic (e.g. social distancing).  This format is felt to be most appropriate for this patient at this time.  All issues noted in this document were discussed and addressed.  No physical exam was performed (except for noted visual exam findings with Video Visits).  Please refer to the patient's chart (MyChart message for video visits and phone note for telephone visits) for the patient's consent to telehealth for [facility].  Date:  01/20/2019   ID:  Brandi Brandi Ramsey, DOB 05-27-1951, MRN [id number]  Patient Location:  Home  Provider location:   [place]  PCP:  Brandi Lima, MD  Cardiologist:  Brandi Him, MD Electrophysiologist:  None   Chief Complaint:  Orthostatic hypotension, HTN  History of Present Illness:    Brandi Brandi Ramsey is a 68 y.o. female who presents via audio/video conferencing for a telehealth visit today.    Brandi Brandi Ramsey a [phone].o.femalewith a hx of DM, tachycardia, HL. She was evaluated by me in the hospital in 5/16 with chest pain. Outpatient nuclear stress test was low risk and negative for ischemia with normal LV function.  She was seen by Brandi Dopp, PA 11/2015 for  preoperative clearance for bariatric surgery.  She was felt to be low risk for bariatric surgery.   She was seen by neurology on 10/15/2018 due to an episodes of near syncope.  She was having problems with dizziness and states that when she would stand up she would feel very lightheaded like she was going to pass out and her vision would go black but she would never lose consciousness.  It would last a few seconds and then resolved.  There were no symptoms that sounded like vertigo or TIA.  After the symptoms past she would then have a dull non-throbbing frontal headache that would stop after taking ibuprofen.  It was felt that likely her symptoms were related to orthostatic hypotension.  She was encouraged to increase fluid intake and liberalize Na intake as well as try compression hose.    She is here today for followup and is doing well.  She denies any chest pain or pressure, SOB, DOE, PND, orthopnea, LE edema, dizziness (except when depressed), palpitations or syncope. She is compliant with her meds and is tolerating meds with no SE.  She has had a lot of stress in her life recently.  Her special needs Brandi Ramsey contracted COVID 28 and became very ill and was almost ventilated.  He has recovered but is not mentally the same as he had been.  She has been very upset over this and also worries about what her grandkids in [place] are going to do about school.  She has a special need granddaughter as well.  The patient does not have symptoms concerning for COVID-19 infection (fever, chills, cough, or new shortness of breath).    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 Carotid US 3/15: mild soft plaque origin ICA. 1-39% ICA stenosis.   History of echocardiogram    a. Echo 3/15: Moderate LVH, focal basal hypertrophy, EF 60-65%, normal wall motion   History of nuclear stress test    a. Myoview 5/16: Normal perfusion, EF 78%, low risk   Hyperlipidemia    Hypertension    Obesity    Orthostatic hypotension 01/20/2019   Pneumonia    Seizures (HCC)    Stroke (HCC)    Transient cerebral ischemia    Vertigo, benign paroxysmal    Past Surgical History:  Procedure Laterality Date   ABDOMINAL HYSTERECTOMY     BREAST SURGERY     Bilateral mastectomy-TRAM   FRACTURE SURGERY     tib/fib (R) leg   LAPAROSCOPIC GASTRIC SLEEVE RESECTION  05/27/2016   MASTECTOMY       Current Meds  Medication Sig   acetaminophen (TYLENOL) 500 MG tablet Take 500 mg by mouth every 6 (six) hours as needed.   atorvastatin (LIPITOR) 80 MG tablet TAKE 1 TABLET BY MOUTH DAILY AT 6 PM.   clonazePAM (KLONOPIN) 1 MG tablet TAKE 1 TABLET BY MOUTH TWICE A DAY AS NEEDED FOR ANXIETY   DULoxetine (CYMBALTA) 30 MG capsule TAKE 1 CAPSULE BY MOUTH EVERY DAY   ibuprofen (ADVIL) 200 MG tablet Take 200 mg by mouth every 6 (six) hours as needed.   meclizine (ANTIVERT) 12.5 MG tablet TAKE 1 TABLET BY MOUTH 3 (THREE) TIMES DAILY AS NEEDED FOR DIZZINESS.   Multiple Vitamin (MULTIVITAMIN) tablet Take 1 tablet by mouth daily.   pantoprazole (PROTONIX) 40 MG tablet TAKE 1 TABLET TWICE A DAY   PHENobarbital (LUMINAL) 97.2 MG tablet TAKE 1 TABLET BY MOUTH EVERYDAY AT BEDTIME     Allergies:   Buprenorphine hcl, Hydromorphone, Meperidine, Metformin and related, Morphine and related, Pentazocine, Pentazocine lactate, Phenytoin, Demerol, Dilaudid [hydromorphone hcl], Doxycycline hyclate, Ibuprofen, Phenytoin sodium extended, Codeine, and Doxycycline hyclate   Social History   Tobacco Use   Smoking status: Never Smoker    Smokeless tobacco: Never Used  Substance Use Topics   Alcohol use: No    Alcohol/week: 0.0 standard drinks   Drug use: No     Family Hx: The patient's family history includes Alzheimer's disease in her father; Breast cancer in her maternal grandmother; Cervical cancer in her maternal grandmother; Colon cancer in her maternal aunt; Colon polyps in her brother, father, mother, and sister; Diabetes in her brother, father, paternal grandmother, and sister; Fragile X syndrome in her Brandi Ramsey; Heart attack in her mother; Heart failure in her maternal grandfather; Ovarian cancer in her maternal grandmother; Raynaud syndrome in her mother.  ROS:   Please see the history of present illness.     All other systems reviewed and are negative.   Labs/Other Tests and Data Reviewed:    Recent Labs:  05/31/2018: ALT 22 01/17/2019: BUN 28; Creatinine, Ser 0.81; Hemoglobin 14.1; Platelets 241.0; Potassium 4.5; Sodium 137   Recent Lipid Panel Lab Results  Component Value Date/Time   CHOL 144 05/31/2018 11:15 AM   TRIG 69.0 05/31/2018 11:15 AM   HDL 55.70 05/31/2018 11:15 AM   CHOLHDL 3 05/31/2018 11:15 AM   LDLCALC 74 05/31/2018 11:15 AM    Wt Readings from Last 3 Encounters:  01/20/19 145 lb (65.8 kg)  01/17/19 145 lb (65.8 kg)  10/22/18 145 lb (65.8 kg)     Objective:    Vital Signs:  Ht 5' 3.6" (1.615 m)    Wt 145 lb (65.8 kg)    BMI 25.20 kg/m    CONSTITUTIONAL:  Well nourished, well developed female in no acute distress.  EYES: anicteric MOUTH: oral mucosa is pink RESPIRATORY: Normal respiratory effort, symmetric expansion CARDIOVASCULAR: No peripheral edema SKIN: No rash, lesions or ulcers MUSCULOSKELETAL: no digital cyanosis NEURO: Cranial Nerves II-XII grossly intact, moves all extremities PSYCH: Intact judgement and insight.  A&O x 3, Mood/affect appropriate   ASSESSMENT & PLAN:    1.  Orthostatic Hypotension -dizziness has improved with fluids and Na intake as well as  compression hose -she has been staying well hydrated and has been able to walk 60 minutes daily without any problems  2.  Hypertension -Bp controlled  -she has not required antihypertensive meds  3.  Type 2 DM -followed by her PCP -HbA1C was 5.6 this month -appears diet controlled  4.  Hyperlipidemia -LDL goal is < 70 due to DM -her LDL was 74 in Dec 2019 -this is followed by her PCP  5.  Anxiety -she has talked to her PCP about her depression and anxiety and he recommended a therapist which I agree with.  I have asker her to call her PCP to get this set up.   COVID-19 Education: The signs and symptoms of COVID-19 were discussed with the patient and how to seek care for testing (follow up with PCP or arrange E-visit).  The importance of social distancing was discussed today.  Patient Risk:   After full review of this patient's clinical status, I feel that they are at least moderate risk at this time.  Time:   Today, I have spent 20 minutes on telemedicine discussing medical problems including orthostatic hypotension, HTN.  We also reviewed the symptoms of COVID 19 and the ways to protect against contracting the virus with telehealth technology.  I spent an additional 5 minutes reviewing patient's chart including  labs.  Medication Adjustments/Labs and Tests Ordered: Current medicines are reviewed at length with the patient today.  Concerns regarding medicines are outlined above.  Tests Ordered: No orders of the defined types were placed in this encounter.  Medication Changes: No orders of the defined types were placed in this encounter.   Disposition:  Follow up 1 year  Signed, Brandi Him, MD  01/20/2019 2:13 PM    [facility]

## 2019-01-21 LAB — CULTURE, URINE COMPREHENSIVE
MICRO NUMBER:: 706660
RESULT:: NO GROWTH
SPECIMEN QUALITY:: ADEQUATE

## 2019-01-21 LAB — VITAMIN B1: Vitamin B1 (Thiamine): 61 nmol/L — ABNORMAL HIGH (ref 8–30)

## 2019-01-21 LAB — ZINC: Zinc: 77 ug/dL (ref 60–130)

## 2019-01-22 ENCOUNTER — Encounter: Payer: Self-pay | Admitting: Internal Medicine

## 2019-01-24 ENCOUNTER — Encounter: Payer: Self-pay | Admitting: Internal Medicine

## 2019-01-25 ENCOUNTER — Encounter: Payer: Self-pay | Admitting: Internal Medicine

## 2019-01-25 ENCOUNTER — Other Ambulatory Visit: Payer: Self-pay | Admitting: Internal Medicine

## 2019-01-25 DIAGNOSIS — R1032 Left lower quadrant pain: Secondary | ICD-10-CM

## 2019-01-29 ENCOUNTER — Other Ambulatory Visit: Payer: Self-pay | Admitting: Internal Medicine

## 2019-01-30 ENCOUNTER — Other Ambulatory Visit: Payer: Self-pay | Admitting: Internal Medicine

## 2019-01-30 DIAGNOSIS — F411 Generalized anxiety disorder: Secondary | ICD-10-CM

## 2019-02-03 ENCOUNTER — Ambulatory Visit (INDEPENDENT_AMBULATORY_CARE_PROVIDER_SITE_OTHER)
Admission: RE | Admit: 2019-02-03 | Discharge: 2019-02-03 | Disposition: A | Discharge status: Home / Self Care | Payer: PPO | Source: Ambulatory Visit | Attending: Internal Medicine | Admitting: Internal Medicine

## 2019-02-03 ENCOUNTER — Other Ambulatory Visit: Payer: Self-pay

## 2019-02-03 DIAGNOSIS — R1032 Left lower quadrant pain: Secondary | ICD-10-CM

## 2019-02-03 MED ORDER — IOHEXOL 300 MG/ML  SOLN
100.0000 mL | Freq: Once | INTRAMUSCULAR | Status: AC | PRN
  Administered 2019-02-03: 100 mL via INTRAVENOUS

## 2019-02-04 ENCOUNTER — Encounter: Payer: Self-pay | Admitting: Internal Medicine

## 2019-02-08 ENCOUNTER — Other Ambulatory Visit: Payer: Self-pay | Admitting: Internal Medicine

## 2019-02-08 DIAGNOSIS — M5416 Radiculopathy, lumbar region: Secondary | ICD-10-CM | POA: Insufficient documentation

## 2019-02-09 DIAGNOSIS — H04123 Dry eye syndrome of bilateral lacrimal glands: Secondary | ICD-10-CM | POA: Diagnosis not present

## 2019-02-11 ENCOUNTER — Emergency Department (HOSPITAL_COMMUNITY)
Admission: EM | Admit: 2019-02-11 | Discharge: 2019-02-11 | Disposition: A | Discharge status: Home / Self Care | Payer: PPO | Attending: Emergency Medicine | Admitting: Emergency Medicine

## 2019-02-11 ENCOUNTER — Encounter: Payer: Self-pay | Admitting: Internal Medicine

## 2019-02-11 ENCOUNTER — Other Ambulatory Visit: Payer: Self-pay

## 2019-02-11 ENCOUNTER — Encounter (HOSPITAL_COMMUNITY): Payer: Self-pay

## 2019-02-11 ENCOUNTER — Emergency Department (HOSPITAL_COMMUNITY): Payer: PPO

## 2019-02-11 DIAGNOSIS — E119 Type 2 diabetes mellitus without complications: Secondary | ICD-10-CM | POA: Diagnosis not present

## 2019-02-11 DIAGNOSIS — Z20828 Contact with and (suspected) exposure to other viral communicable diseases: Secondary | ICD-10-CM | POA: Diagnosis not present

## 2019-02-11 DIAGNOSIS — Z8673 Personal history of transient ischemic attack (TIA), and cerebral infarction without residual deficits: Secondary | ICD-10-CM | POA: Insufficient documentation

## 2019-02-11 DIAGNOSIS — R5383 Other fatigue: Secondary | ICD-10-CM | POA: Diagnosis not present

## 2019-02-11 DIAGNOSIS — R0602 Shortness of breath: Secondary | ICD-10-CM | POA: Diagnosis not present

## 2019-02-11 DIAGNOSIS — Z79899 Other long term (current) drug therapy: Secondary | ICD-10-CM | POA: Diagnosis not present

## 2019-02-11 DIAGNOSIS — I10 Essential (primary) hypertension: Secondary | ICD-10-CM | POA: Insufficient documentation

## 2019-02-11 DIAGNOSIS — B349 Viral infection, unspecified: Secondary | ICD-10-CM | POA: Insufficient documentation

## 2019-02-11 DIAGNOSIS — R059 Cough, unspecified: Secondary | ICD-10-CM

## 2019-02-11 DIAGNOSIS — R0789 Other chest pain: Secondary | ICD-10-CM | POA: Diagnosis not present

## 2019-02-11 DIAGNOSIS — Z20822 Contact with and (suspected) exposure to covid-19: Secondary | ICD-10-CM

## 2019-02-11 DIAGNOSIS — R05 Cough: Secondary | ICD-10-CM | POA: Diagnosis present

## 2019-02-11 LAB — CBC
HCT: 44.8 % (ref 36.0–46.0)
Hemoglobin: 14.2 g/dL (ref 12.0–15.0)
MCH: 28.3 pg (ref 26.0–34.0)
MCHC: 31.7 g/dL (ref 30.0–36.0)
MCV: 89.2 fL (ref 80.0–100.0)
Platelets: 258 10*3/uL (ref 150–400)
RBC: 5.02 MIL/uL (ref 3.87–5.11)
RDW: 12.7 % (ref 11.5–15.5)
WBC: 7.7 10*3/uL (ref 4.0–10.5)
nRBC: 0 % (ref 0.0–0.2)

## 2019-02-11 LAB — BASIC METABOLIC PANEL
Anion gap: 10 (ref 5–15)
BUN: 22 mg/dL (ref 8–23)
CO2: 23 mmol/L (ref 22–32)
Calcium: 9.1 mg/dL (ref 8.9–10.3)
Chloride: 105 mmol/L (ref 98–111)
Creatinine, Ser: 0.83 mg/dL (ref 0.44–1.00)
GFR calc Af Amer: >60 mL/min (ref >60)
GFR calc non Af Amer: >60 mL/min (ref >60)
Glucose, Bld: 107 mg/dL — ABNORMAL HIGH (ref 70–99)
Potassium: 4.4 mmol/L (ref 3.5–5.1)
Sodium: 138 mmol/L (ref 135–145)

## 2019-02-11 LAB — URINALYSIS, ROUTINE W REFLEX MICROSCOPIC
Bilirubin Urine: NEGATIVE
Glucose, UA: NEGATIVE mg/dL
Hgb urine dipstick: NEGATIVE
Ketones, ur: NEGATIVE mg/dL
Leukocytes,Ua: NEGATIVE
Nitrite: NEGATIVE
Protein, ur: NEGATIVE mg/dL
Specific Gravity, Urine: 1.012 (ref 1.005–1.030)
pH: 6 (ref 5.0–8.0)

## 2019-02-11 LAB — SARS CORONAVIRUS 2 (TAT 6-24 HRS): SARS Coronavirus 2: NEGATIVE

## 2019-02-11 LAB — TROPONIN I (HIGH SENSITIVITY): Troponin I (High Sensitivity): 3 ng/L (ref <18)

## 2019-02-11 MED ORDER — IBUPROFEN 400 MG PO TABS
600.0000 mg | ORAL_TABLET | Freq: Once | ORAL | Status: AC
  Administered 2019-02-11: 600 mg via ORAL
  Filled 2019-02-11: qty 1

## 2019-02-11 MED ORDER — SODIUM CHLORIDE 0.9% FLUSH: 3.0000 mL | Freq: Once | INTRAVENOUS | Status: DC

## 2019-02-11 NOTE — Discharge Instructions (Addendum)
Please call your primary doctor to schedule an appointment for next week for recheck.  This can be done as a virtual visit.  If in the meantime you develop difficulty breathing, chest pain, abdominal pain or other new concerning symptoms recommend returning to the ER for reassessment.  Recommend continuing Tylenol, Motrin as needed for myalgias, headaches and fevers.  Follow isolation precautions as discussed.

## 2019-02-11 NOTE — ED Provider Notes (Signed)
[facility] EMERGENCY DEPARTMENT Provider Note   CSN: [id number] Arrival date & time: 02/11/19  1416     History   Chief Complaint Chief Complaint  Patient presents with  . Chest Pain  . Fatigue  . Cough  . Sore Throat    HPI Brandi Ramsey is a 68 y.o. female.  Presents emergency department with chief complaint cough, malaise.  Symptoms have been going on for the past 3 days, has had myriad complaints including chest discomfort, sore throat, cough, headache and fatigue.  States headache is mild to moderate in severity, dull, ache, not worst headache of her life, not sudden onset, not associated with neck stiffness or neck pain.  States cough has been moderate, nonproductive.  Has had sore throat but no difficulty swallowing, no voice change, no neck swelling.  Chest pain most frequently with cough, currently chest pain-free, not associated with exertion, mild in nature, dull ache.  States her husband has had similar symptoms.  They have a son who was admitted a few weeks ago due to [place] however they have not had recent contact with him and had not had recent contact with anyone known to have the coronavirus.     HPI  Past Medical History:  Diagnosis Date  . Anxiety   . Arthritis   . Cerebellar ataxia ([place])   . Colon polyps   . Depression   . Diabetes mellitus without complication ([place])   . Fibrocystic breast   . Fibromyalgia   . 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 Gastric ulcer   . Hemorrhoids   . History of Doppler ultrasound    a. Carotid US 3/15: mild soft plaque origin ICA. 1-39% ICA stenosis.  [name] History of echocardiogram    a. Echo 3/15: Moderate LVH, focal basal hypertrophy, EF 60-65%, normal wall motion  . History of nuclear stress test    a. Myoview 5/16: Normal perfusion, EF 78%, low risk  . Hyperlipidemia   . Hypertension   . Obesity   . Orthostatic hypotension 01/20/2019  . Pneumonia   . Seizures ([place])   . Stroke ([place])    . Transient cerebral ischemia   . Vertigo, benign paroxysmal     Patient Active Problem List   Diagnosis Date Noted  . Left lumbar radiculitis 02/08/2019  . Orthostatic hypotension 01/20/2019  . Bilateral hearing loss 05/31/2018  . Vertigo 07/07/2017  . Status post laparoscopic sleeve gastrectomy 02/24/2017  . Cervical cancer screening 02/24/2017  . Visit for screening mammogram 11/18/2016  . GAD (generalized anxiety disorder) 06/24/2016  . 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 Generalized epilepsy ([place] [place]) 10/10/2014  . RAD (reactive airway disease) 02/01/2014  . Acid reflux 09/13/2013  . Benign essential HTN 09/13/2013  . Hyperlipidemia with target LDL less than 100 09/13/2013  . Prediabetes 09/13/2013  . TIA (transient ischemic attack) 08/27/2013  . Avitaminosis D 04/06/2013  . Allergic rhinitis 11/27/2012  . Clinical depression 11/27/2012  . Bone/cartilage disorder 11/27/2012  . Fragile X syndrome 11/27/2012  . Absence of bladder continence 11/27/2012  . Cardiac conduction disorder 11/27/2012  . DS (disseminated sclerosis) ([place]) 11/27/2012    Past Surgical History:  Procedure Laterality Date  . ABDOMINAL HYSTERECTOMY    . BREAST SURGERY     Bilateral mastectomy-TRAM  . FRACTURE SURGERY     tib/fib (R) leg  . LAPAROSCOPIC GASTRIC SLEEVE RESECTION  05/27/2016  . MASTECTOMY  OB History    Gravida  4   Para  2   Term      Preterm      AB  2   Living  2     SAB  2   TAB      Ectopic      Multiple      Live Births               Home Medications    Prior to Admission medications   Medication Sig Start Date End Date Taking? Authorizing Provider  acetaminophen (TYLENOL) 500 MG tablet Take 500 mg by mouth every 6 (six) hours as needed.    [provider]  atorvastatin (LIPITOR) 80 MG tablet TAKE 1 TABLET BY MOUTH DAILY AT 6 PM. 06/26/18   Janith Lima, MD  clonazePAM (KLONOPIN) 1 MG tablet TAKE 1 TABLET  BY MOUTH TWICE A DAY AS NEEDED FOR ANXIETY 01/31/19   Janith Lima, MD  DULoxetine (CYMBALTA) 30 MG capsule TAKE 1 CAPSULE BY MOUTH EVERY DAY 10/25/18   Janith Lima, MD  ibuprofen (ADVIL) 200 MG tablet Take 200 mg by mouth every 6 (six) hours as needed.    [provider]  meclizine (ANTIVERT) 12.5 MG tablet TAKE 1 TABLET BY MOUTH 3 (THREE) TIMES DAILY AS NEEDED FOR DIZZINESS. 10/25/18   [provider]  Multiple Vitamin (MULTIVITAMIN) tablet Take 1 tablet by mouth daily.    [provider]  pantoprazole (PROTONIX) 40 MG tablet TAKE 1 TABLET BY MOUTH TWICE A DAY 01/30/19   Janith Lima, MD  PHENobarbital (LUMINAL) 97.2 MG tablet TAKE 1 TABLET BY MOUTH EVERYDAY AT BEDTIME 01/03/19   Janith Lima, MD    Family History Family History  Problem Relation Age of Onset  . Raynaud syndrome Mother   . Colon polyps Mother   . Heart attack Mother   . Alzheimer's disease Father   . Diabetes Father   . Colon polyps Father   . Fragile X syndrome Son        Patient is carrier  . Diabetes Sister   . Diabetes Brother   . Cervical cancer Maternal Grandmother   . Breast cancer Maternal Grandmother   . Ovarian cancer Maternal Grandmother   . Heart failure Maternal Grandfather   . Diabetes Paternal Grandmother   . Colon polyps Sister   . Colon polyps Brother   . Colon cancer Maternal Aunt     Social History Social History   Tobacco Use  . Smoking status: Never Smoker  . Smokeless tobacco: Never Used  Substance Use Topics  . Alcohol use: No    Alcohol/week: 0.0 standard drinks  . Drug use: No     Allergies   Buprenorphine hcl, Hydromorphone, Meperidine, Metformin and related, Morphine and related, Pentazocine, Pentazocine lactate, Phenytoin, Demerol, Dilaudid [hydromorphone hcl], Doxycycline hyclate, Ibuprofen, Phenytoin sodium extended, Codeine, and Doxycycline hyclate   Review of Systems Review of Systems  Constitutional: Positive for fatigue. Negative  for chills and fever.  HENT: Positive for sore throat. Negative for ear pain.   Eyes: Negative for pain and visual disturbance.  Respiratory: Negative for cough and shortness of breath.   Cardiovascular: Positive for chest pain. Negative for palpitations.  Gastrointestinal: Negative for abdominal pain and vomiting.  Genitourinary: Negative for dysuria and hematuria.  Musculoskeletal: Negative for arthralgias and back pain.  Skin: Negative for color change and rash.  Neurological: Negative for seizures and syncope.  All other  systems reviewed and are negative.    Physical Exam Updated Vital Signs BP 122/86   Pulse 78   Temp 98.3 F (36.8 C) (Oral)   Resp 19   SpO2 100%   Physical Exam Vitals signs and nursing note reviewed.  Constitutional:      General: She is not in acute distress.    Appearance: She is well-developed.  HENT:     Head: Normocephalic and atraumatic.  Eyes:     Conjunctiva/sclera: Conjunctivae normal.  Neck:     Musculoskeletal: Neck supple.  Cardiovascular:     Rate and Rhythm: Normal rate and regular rhythm.     Heart sounds: No murmur.  Pulmonary:     Effort: Pulmonary effort is normal. No respiratory distress.     Breath sounds: Normal breath sounds.  Abdominal:     Palpations: Abdomen is soft.     Tenderness: There is no abdominal tenderness.  Musculoskeletal: Normal range of motion.     Right lower leg: No edema.     Left lower leg: No edema.  Skin:    General: Skin is warm and dry.     Capillary Refill: Capillary refill takes less than 2 seconds.  Neurological:     General: No focal deficit present.     Mental Status: She is alert.      ED Treatments / Results  Labs (all labs ordered are listed, but only abnormal results are displayed) Labs Reviewed  BASIC METABOLIC PANEL - Abnormal; Notable for the following components:      Result Value   Glucose, Bld 107 (*)    All other components within normal limits  NOVEL CORONAVIRUS, NAA  (HOSPITAL ORDER, SEND-OUT TO REF LAB)  CBC  URINALYSIS, ROUTINE W REFLEX MICROSCOPIC  TROPONIN I (HIGH SENSITIVITY)    EKG None  February 11 1455, sinus rhythm, rate 81, no ST segment or T wave changes, normal axis and normal intervals  Radiology No results found.  Procedures Procedures (including critical care time)  Medications Ordered in ED Medications  sodium chloride flush (NS) 0.9 % injection 3 mL (has no administration in time range)  ibuprofen (ADVIL) tablet 600 mg (600 mg Oral Given 02/11/19 1512)     Initial Impression / Assessment and Plan / ED Course  I have reviewed the triage vital signs and the nursing notes.  Pertinent labs & imaging results that were available during my care of the patient were reviewed by me and considered in my medical decision making (see chart for details).  Clinical Course as of Feb 10 1926  Fri Feb 11, 2019  1718 Rechecked, discussed results, remains well appearing, reviewed return precautions   [RD]    Clinical Course User Index [RD] Lucrezia Starch, MD       [age]-year-old lady who presented to the emergency department with cough, fatigue, headaches.  On exam patient was well-appearing with normal vital signs.  Chest x-ray without evidence for pneumonia, labs within normal limits.  EKG without ischemic changes.  I suspect patient's symptoms are related to acute viral illness.  Will test for COVID.  Given stable vital signs and overall well appearance on multiple assessments, believe patient appropriate for outpatient management this time.  I reviewed strict return precautions as well as isolation precautions until results COVID come back.    After the discussed management above, the patient was determined to be safe for discharge.  The patient was in agreement with this plan and all questions regarding  their care were answered.  ED return precautions were discussed and the patient will return to the ED with any significant worsening  of condition.    Final Clinical Impressions(s) / ED Diagnoses   Final diagnoses:  Acute viral syndrome  Cough  Suspected Covid-19 Virus Infection    ED Discharge Orders    None       Lucrezia Starch, MD 02/11/19 [phone]

## 2019-02-11 NOTE — ED Triage Notes (Signed)
Pt has multiple complaints: chest pain, sore throat, cough, headache and fatigue for the past 3 days. States her son was positive for COVID this year. Pt taking tylenol and motrin OTC. Pt a.o, nad noted

## 2019-02-11 NOTE — ED Notes (Signed)
Patient verbalizes understanding of discharge instructions. Opportunity for questioning and answers were provided. Armband removed by staff, pt discharged from ED.  

## 2019-02-12 ENCOUNTER — Telehealth: Payer: Self-pay | Admitting: *Deleted

## 2019-02-12 NOTE — Telephone Encounter (Signed)
TOC CM received call from pt and requested results from her COVID test. States she still having fever, diarrhea, loss of appetite and brain fog. Explained to pt she can come back to ED or contact her PCP on call. States she is afraid of coming back to ED, and wants to manage at home. States she will seek medical attention if her symptoms worsen. Jonnie Finner RN CCM Case Mgmt phone [phone]

## 2019-02-14 ENCOUNTER — Encounter: Payer: Self-pay | Admitting: Internal Medicine

## 2019-02-15 ENCOUNTER — Telehealth: Payer: Self-pay | Admitting: Internal Medicine

## 2019-02-15 ENCOUNTER — Encounter: Payer: Self-pay | Admitting: Internal Medicine

## 2019-02-15 NOTE — Telephone Encounter (Signed)
Would you be willing to do a virtual visit?

## 2019-02-15 NOTE — Telephone Encounter (Addendum)
Pt following up on mychart messages sent.  Pt would like Dr Ronnald Ramp advise and talk to him about her covid symptoms.  They think she may have had a false positive test.  If pt truly has a virus, she would like to know if anything can be given to help? Pt has a small cough, no breathing issues, just the fatigue, fever, headaches, joint pain, is not getting any better. Pt is frustrated and really needs to speak with her doctor.  Pt is asking to please do a virtual anytime with her.  Pt states [place] continues to call and ask her why she has not spoken with her dr

## 2019-02-16 ENCOUNTER — Telehealth: Payer: Self-pay | Admitting: Internal Medicine

## 2019-02-16 ENCOUNTER — Other Ambulatory Visit: Payer: Self-pay | Admitting: Internal Medicine

## 2019-02-16 DIAGNOSIS — R42 Dizziness and giddiness: Secondary | ICD-10-CM

## 2019-02-16 NOTE — Telephone Encounter (Signed)
Patient states she was seen in the ED.  She has had fever in waves for 8 days now.  Also, has headache, body aches and sore throat.   States she was tested for COVID but that it had came back negative.  States that ED physician did want her to get tested again but patient did not want to go back to the ED.   Can an order be placed for patient to be tested at [facility]?

## 2019-02-17 NOTE — Telephone Encounter (Signed)
Spoke to pt and informed that [facility] does testing without orders. Gave pt address.

## 2019-02-18 ENCOUNTER — Other Ambulatory Visit: Payer: Self-pay | Admitting: *Deleted

## 2019-02-18 DIAGNOSIS — Z20822 Contact with and (suspected) exposure to covid-19: Secondary | ICD-10-CM

## 2019-02-18 DIAGNOSIS — R6889 Other general symptoms and signs: Secondary | ICD-10-CM | POA: Diagnosis not present

## 2019-02-19 ENCOUNTER — Encounter: Payer: Self-pay | Admitting: Internal Medicine

## 2019-02-20 LAB — NOVEL CORONAVIRUS, NAA: SARS-CoV-2, NAA: NOT DETECTED

## 2019-03-01 ENCOUNTER — Telehealth: Payer: Self-pay | Admitting: Neurology

## 2019-03-01 ENCOUNTER — Encounter: Payer: Self-pay | Admitting: Internal Medicine

## 2019-03-01 NOTE — Telephone Encounter (Signed)
Pt needs to see if Dr [name] can help her she is depress and needs a anti depressant. She states that she is sinking. She uses the [facility] in [place]

## 2019-03-02 ENCOUNTER — Encounter: Payer: Self-pay | Admitting: Internal Medicine

## 2019-03-02 NOTE — Telephone Encounter (Signed)
Please advise, PCP should handle this correct?

## 2019-03-02 NOTE — Telephone Encounter (Signed)
Pt called

## 2019-03-02 NOTE — Telephone Encounter (Signed)
Yes, I think it is best that this be handled by her PCP.

## 2019-03-03 ENCOUNTER — Encounter: Payer: Self-pay | Admitting: Internal Medicine

## 2019-03-03 ENCOUNTER — Other Ambulatory Visit: Payer: Self-pay | Admitting: Internal Medicine

## 2019-03-03 DIAGNOSIS — F331 Major depressive disorder, recurrent, moderate: Secondary | ICD-10-CM

## 2019-03-03 MED ORDER — DULOXETINE HCL 60 MG PO CPEP: 60.0000 mg | ORAL_CAPSULE | Freq: Every day | ORAL | 1 refills | Status: DC

## 2019-03-09 ENCOUNTER — Encounter: Payer: Self-pay | Admitting: Internal Medicine

## 2019-03-09 ENCOUNTER — Ambulatory Visit (INDEPENDENT_AMBULATORY_CARE_PROVIDER_SITE_OTHER): Payer: PPO

## 2019-03-09 ENCOUNTER — Encounter: Payer: Self-pay | Admitting: Family Medicine

## 2019-03-09 DIAGNOSIS — Z23 Encounter for immunization: Secondary | ICD-10-CM

## 2019-03-10 ENCOUNTER — Ambulatory Visit: Payer: PPO

## 2019-03-10 ENCOUNTER — Ambulatory Visit: Payer: PPO | Admitting: Family Medicine

## 2019-03-14 ENCOUNTER — Other Ambulatory Visit: Payer: Self-pay | Admitting: Internal Medicine

## 2019-03-30 ENCOUNTER — Encounter: Payer: Self-pay | Admitting: Gynecology

## 2019-04-02 ENCOUNTER — Other Ambulatory Visit: Payer: Self-pay | Admitting: Internal Medicine

## 2019-04-02 DIAGNOSIS — F320 Major depressive disorder, single episode, mild: Secondary | ICD-10-CM

## 2019-04-03 ENCOUNTER — Other Ambulatory Visit: Payer: Self-pay | Admitting: Internal Medicine

## 2019-04-03 DIAGNOSIS — G40309 Generalized idiopathic epilepsy and epileptic syndromes, not intractable, without status epilepticus: Secondary | ICD-10-CM

## 2019-04-04 ENCOUNTER — Other Ambulatory Visit: Payer: Self-pay | Admitting: Internal Medicine

## 2019-04-04 DIAGNOSIS — G40309 Generalized idiopathic epilepsy and epileptic syndromes, not intractable, without status epilepticus: Secondary | ICD-10-CM

## 2019-04-04 MED ORDER — PHENOBARBITAL 97.2 MG PO TABS: ORAL_TABLET | ORAL | 1 refills | Status: DC

## 2019-04-13 ENCOUNTER — Ambulatory Visit: Payer: PPO | Admitting: Family Medicine

## 2019-04-14 NOTE — Progress Notes (Signed)
Virtual Visit via Video Note The purpose of this virtual visit is to provide medical care while limiting exposure to the novel coronavirus.    Consent was obtained for video visit:  Yes.   Answered questions that patient had about telehealth interaction:  Yes.   I discussed the limitations, risks, security and privacy concerns of performing an evaluation and management service by telemedicine. I also discussed with the patient that there may be a patient responsible charge related to this service. The patient expressed understanding and agreed to proceed.  Pt location: Home Physician Location: office Name of referring provider:  Janith Lima, MD I connected with Brandi Ramsey at patients initiation/request on 04/18/2019 at  2:30 PM EDT by video enabled telemedicine application and verified that I am speaking with the correct person using two identifiers. Pt MRN:  [id number] Pt DOB:  1950/11/22 Video Participants:  Brandi Ramsey   History of Present Illness:  Brandi Ramsey is a 68 year old woman with hypercholesterolemia, type 2 diabetes mellitus, migraine, depression and remote history of seizure disorder, concussion and TIA who follows up for fragile X tremor-associated syndrome, tension-type headache.  UPDATE: Current medications:  Phenobarbital 97.5mg  at bedtime (seizure prophylaxis); Calcium 500mg  twice daily and vitamin D 600 IU twice daily; Cymbalta 60mg  daily (headache prophylaxis, increased by Dr. Ronnald Ramp to address depression)  She has had a difficult year.  Her Ramsey with special needs and lives in a facility had COVID earlier this year.  Two weeks ago, she saw him in person for the first time since January.  Her daughter-in-law has had some mental health issues.  She still tries to take care of herself.  She walks 60 minutes everyday.    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   She has had a progressive course over the years.She will have dizzy spells, described as lightheadedness, which will cause falls.It lasts a few seconds.  She has never actually lost consciousness.  She has fractured her wrists and leg.She also has tremor of the hands, causing difficulty with writing.She also developed dysphonia and was found to have tremor involving her "voice box".She reports prior episodes of both bowel and bladder incontinence, which are rare.She also reports poor depth perception.For example, she may sometimes drive too close to the curb or she may accidentally bump into somebody when walking through the grocery store aisle.She reports increased fatigue and weight gain.She has depression and mood swings.She also reports short-term memory problems.She also has problems with swallowing and has choked on some occasions, requiring the Heimlich maneuver.  She has a remote history of epilepsy.She had convulsions as a baby and two generalized tonic-clonic seizures in her early twenties.She has been on phenobarbital ever since.No recurrent seizures.A DEXA scan was performed in 2016 and was negative.She continues on phenobarbital 97.5mg  at bedtime.  She also has history of migraine, described as severe pressure and throbbing, over her left eye.In March 2015, she had such a headache associated with right sided facial numbness and lower facial weakness.She was admitted to [facility] for TIA.CT of the  head was unremarkable.MRI of the brain showed periventricular and subcortical  hyperintensities.MRA of the head showed no major intracranial arterial stenosis, however there is question of mild narrowing of the left cavernous ICA.Carotid duplex showed 1-39% bilateral ICA stenosis.2D echo showed LVEF of 60-65% with moderate LVH.  She suffered postconcussion syndrome since slipping and hitting the back of her head in August 2016.She says she passed afterwards for a few seconds.Afterwards, she noted headache, spinning sensation, nausea and she vomited.She went to the ED where CT of the head and cervical spine showed no acute findings.She was discharged home with meclizine and hydrocodone.Since she has been home, she still notes constant dull posterior headache.She takes ibuprofen for it daily but not the hydrocodone.The dizziness is improving.She has been more emotional and was crying easily.She is seeing Dr. Hulan Saas of Sports Medicine for management.  Due to concerns of possible prior history of MS, she underwent MRI of the brain and cervical spine with and without contrast on 03/15/15. There was slight progression of nonspecific cerebral white matter changes, most probably chronic small vessel disease, compared to 2012. Cervical spine showed no cord lesions.   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 Carotid US 3/15: mild soft plaque origin ICA. 1-39% ICA stenosis.   History of echocardiogram    a. Echo 3/15: Moderate LVH, focal basal hypertrophy, EF 60-65%, normal wall motion   History of nuclear stress test    a. Myoview 5/16: Normal perfusion, EF 78%, low risk   Hyperlipidemia     Hypertension    Obesity    Orthostatic hypotension 01/20/2019   Pneumonia    Seizures ([facility])    Stroke ([facility])    Transient cerebral ischemia    Vertigo, benign paroxysmal     Medications: Outpatient Encounter Medications as of 04/18/2019  Medication Sig   acetaminophen (TYLENOL) 325 MG tablet Take 650 mg by mouth every 6 (six) hours as needed for mild pain or fever.   atorvastatin (LIPITOR) 80 MG tablet TAKE 1 TABLET BY MOUTH EVERY DAY AT 6PM   clonazePAM (KLONOPIN) 1 MG tablet TAKE 1 TABLET BY MOUTH TWICE A DAY AS NEEDED FOR ANXIETY (Patient taking differently: Take 1 mg by mouth 2 (two) times daily as needed for anxiety. )   DULoxetine (CYMBALTA) 60 MG capsule Take 1 capsule (60 mg total) by mouth daily.   ibuprofen (ADVIL) 200 MG tablet Take 200 mg by mouth every 6 (six) hours as needed for fever or mild pain.    meclizine (ANTIVERT) 12.5 MG tablet TAKE 1 TABLET BY MOUTH 3 (THREE) TIMES DAILY AS NEEDED FOR DIZZINESS.   Multiple Vitamin (MULTIVITAMIN) tablet Take 1 tablet by mouth daily.   pantoprazole (PROTONIX) 40 MG tablet TAKE 1 TABLET BY MOUTH TWICE A DAY (Patient taking differently: Take 40 mg by mouth 2 (two) times daily. )   PHENobarbital (LUMINAL) 97.2 MG tablet TAKE 1 TABLET BY MOUTH EVERYDAY AT BEDTIME   No facility-administered encounter medications on file as of 04/18/2019.     Allergies: Allergies  Allergen Reactions  Buprenorphine Hcl Hives and Itching   Hydromorphone     Could not focus, "Seeing scary things."  Noises louder. Altered mental status   Meperidine Hives and Itching    Other reaction(s): HIVES,ITCHING   Metformin And Related Diarrhea   Morphine And Related Hives and Itching    States only when mixed with Demerol   Pentazocine Anaphylaxis   Pentazocine Lactate Anaphylaxis   Phenytoin Hives    Other reaction(s): RASH   Demerol Hives and Itching   Dilaudid [Hydromorphone Hcl]     Hallucinations and felt crazy     Doxycycline Hyclate     Other reaction(s): GI UPSET,NAUSEA,VOMITING   Ibuprofen     Contraindicated for risk of GI bleeding.   Phenytoin Sodium Extended Hives   Codeine Rash and Nausea Only    When mixed with Demerol Patient reports no reaction with Codeine/APAP (Tylenol #3)   Doxycycline Hyclate Nausea And Vomiting    Other reaction(s): GI UPSET,NAUSEA,VOMITING Other reaction(s): GI UPSET,NAUSEA,VOMITING    Family History: Family History  Problem Relation Age of Onset   Raynaud syndrome Mother    Colon polyps Mother    Heart attack Mother    Alzheimer's disease Father    Diabetes Father    Colon polyps Father    Fragile X syndrome Ramsey        Patient is carrier   Diabetes Sister    Diabetes Brother    Cervical cancer Maternal Grandmother    Breast cancer Maternal Grandmother    Ovarian cancer Maternal Grandmother    Heart failure Maternal Grandfather    Diabetes Paternal Grandmother    Colon polyps Sister    Colon polyps Brother    Colon cancer Maternal Aunt     Social History: Social History   Socioeconomic History   Marital status: Married    Spouse name: International aid/development worker   Number of children: 2   Years of education: Not on file   Highest education level: Not on file  Occupational History   Occupation: retired  Scientist, product/process development strain: Not hard at all   Food insecurity    Worry: Never true    Inability: Never true   Transportation needs    Medical: No    Non-medical: No  Tobacco Use   Smoking status: Never Smoker   Smokeless tobacco: Never Used  Substance and Sexual Activity   Alcohol use: No    Alcohol/week: 0.0 standard drinks   Drug use: No   Sexual activity: Yes    Partners: Male    Comment: 1st intercourse 69 yo-Fewer than 5 partners  Lifestyle   Physical activity    Days per week: 5 days    Minutes per session: 50 min   Stress: Very much  Relationships   Social connections    Talks on phone:  More than three times a week    Gets together: More than three times a week    Attends religious service: More than 4 times per year    Active member of club or organization: Yes    Attends meetings of clubs or organizations: More than 4 times per year    Relationship status: Married   Intimate partner violence    Fear of current or ex partner: No    Emotionally abused: No    Physically abused: No    Forced sexual activity: No  Other Topics Concern   Not on file  Social History Narrative   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 No acute distress.  Alert and oriented.  Speech fluent and not dysarthric.  Language intact.  Eyes orthophoric on primary gaze.  Face symmetric.  Assessment and Plan:   1.  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 2.  Depression and anxiety 3.  Tension type headache, not intractable 4.  Migraine with aura, without status migrainosus, not intractable 5.  History of generalized epilepsy  1.  Phenobarbital 97.5mg  at bedtime.   2.  Calcium 500mg  twice daily and vitamin D 600 IU twice daily 3.  Cymbalta 60mg  daily for headache prophylaxis 4.  Limit use of pain relievers to no more than 2 days out of week to prevent risk of rebound or medication-overuse headache. 5.  Refer to [place]. 6.  Follow up in 9 months.  Follow Up Instructions:    -I discussed the assessment and treatment plan with the patient. The patient was provided an opportunity to ask questions and all were answered. The patient agreed with the plan and demonstrated an understanding of the instructions.   The patient was advised to call back or seek an in-person evaluation if the symptoms worsen or if the condition fails to improve as anticipated.    Total Time spent in visit with the patient was:  35 minutes  Dudley Major, DO

## 2019-04-18 ENCOUNTER — Other Ambulatory Visit: Payer: Self-pay

## 2019-04-18 ENCOUNTER — Telehealth (INDEPENDENT_AMBULATORY_CARE_PROVIDER_SITE_OTHER): Payer: PPO | Admitting: Neurology

## 2019-04-18 ENCOUNTER — Encounter: Payer: Self-pay | Admitting: Neurology

## 2019-04-18 VITALS — Ht 63.5 in | Wt 148.0 lb

## 2019-04-18 DIAGNOSIS — G40309 Generalized idiopathic epilepsy and epileptic syndromes, not intractable, without status epilepticus: Secondary | ICD-10-CM

## 2019-04-18 DIAGNOSIS — G43909 Migraine, unspecified, not intractable, without status migrainosus: Secondary | ICD-10-CM

## 2019-04-18 DIAGNOSIS — F331 Major depressive disorder, recurrent, moderate: Secondary | ICD-10-CM | POA: Diagnosis not present

## 2019-04-18 DIAGNOSIS — G44219 Episodic tension-type headache, not intractable: Secondary | ICD-10-CM

## 2019-04-18 DIAGNOSIS — G119 Hereditary ataxia, unspecified: Secondary | ICD-10-CM

## 2019-04-18 DIAGNOSIS — Q992 Fragile X chromosome: Secondary | ICD-10-CM

## 2019-04-27 ENCOUNTER — Other Ambulatory Visit: Payer: Self-pay | Admitting: Internal Medicine

## 2019-04-27 DIAGNOSIS — F320 Major depressive disorder, single episode, mild: Secondary | ICD-10-CM

## 2019-04-28 ENCOUNTER — Other Ambulatory Visit: Payer: Self-pay | Admitting: Internal Medicine

## 2019-04-30 ENCOUNTER — Other Ambulatory Visit: Payer: Self-pay | Admitting: Internal Medicine

## 2019-04-30 DIAGNOSIS — F320 Major depressive disorder, single episode, mild: Secondary | ICD-10-CM

## 2019-05-11 LAB — HM DIABETES EYE EXAM

## 2019-05-16 ENCOUNTER — Ambulatory Visit: Payer: PPO | Admitting: Psychology

## 2019-06-02 ENCOUNTER — Ambulatory Visit (INDEPENDENT_AMBULATORY_CARE_PROVIDER_SITE_OTHER): Payer: PPO | Admitting: Psychology

## 2019-06-02 DIAGNOSIS — F4310 Post-traumatic stress disorder, unspecified: Secondary | ICD-10-CM | POA: Diagnosis not present

## 2019-06-02 DIAGNOSIS — F331 Major depressive disorder, recurrent, moderate: Secondary | ICD-10-CM | POA: Diagnosis not present

## 2019-06-15 ENCOUNTER — Ambulatory Visit (INDEPENDENT_AMBULATORY_CARE_PROVIDER_SITE_OTHER): Payer: PPO | Admitting: Psychology

## 2019-06-15 DIAGNOSIS — F331 Major depressive disorder, recurrent, moderate: Secondary | ICD-10-CM

## 2019-06-15 DIAGNOSIS — F4310 Post-traumatic stress disorder, unspecified: Secondary | ICD-10-CM

## 2019-06-21 ENCOUNTER — Other Ambulatory Visit: Payer: Self-pay | Admitting: Internal Medicine

## 2019-06-21 DIAGNOSIS — F320 Major depressive disorder, single episode, mild: Secondary | ICD-10-CM

## 2019-06-21 DIAGNOSIS — F411 Generalized anxiety disorder: Secondary | ICD-10-CM

## 2019-06-22 ENCOUNTER — Other Ambulatory Visit: Payer: Self-pay | Admitting: Internal Medicine

## 2019-06-22 ENCOUNTER — Ambulatory Visit (INDEPENDENT_AMBULATORY_CARE_PROVIDER_SITE_OTHER): Payer: PPO | Admitting: Psychology

## 2019-06-22 DIAGNOSIS — F331 Major depressive disorder, recurrent, moderate: Secondary | ICD-10-CM | POA: Diagnosis not present

## 2019-06-22 DIAGNOSIS — F4310 Post-traumatic stress disorder, unspecified: Secondary | ICD-10-CM

## 2019-06-22 DIAGNOSIS — F411 Generalized anxiety disorder: Secondary | ICD-10-CM

## 2019-06-22 MED ORDER — CLONAZEPAM 1 MG PO TABS: 1.0000 mg | ORAL_TABLET | Freq: Two times a day (BID) | ORAL | 3 refills | Status: DC | PRN

## 2019-06-27 ENCOUNTER — Ambulatory Visit: Payer: PPO | Admitting: Psychology

## 2019-06-28 ENCOUNTER — Ambulatory Visit (INDEPENDENT_AMBULATORY_CARE_PROVIDER_SITE_OTHER): Payer: PPO | Admitting: Psychology

## 2019-06-28 DIAGNOSIS — F4310 Post-traumatic stress disorder, unspecified: Secondary | ICD-10-CM

## 2019-06-28 DIAGNOSIS — F331 Major depressive disorder, recurrent, moderate: Secondary | ICD-10-CM | POA: Diagnosis not present

## 2019-07-05 ENCOUNTER — Ambulatory Visit (INDEPENDENT_AMBULATORY_CARE_PROVIDER_SITE_OTHER): Payer: PPO | Admitting: Psychology

## 2019-07-05 DIAGNOSIS — F4323 Adjustment disorder with mixed anxiety and depressed mood: Secondary | ICD-10-CM

## 2019-07-05 DIAGNOSIS — F4310 Post-traumatic stress disorder, unspecified: Secondary | ICD-10-CM | POA: Diagnosis not present

## 2019-07-05 DIAGNOSIS — F331 Major depressive disorder, recurrent, moderate: Secondary | ICD-10-CM

## 2019-07-07 ENCOUNTER — Encounter: Payer: Self-pay | Admitting: Internal Medicine

## 2019-07-15 ENCOUNTER — Ambulatory Visit: Payer: PPO | Admitting: Psychology

## 2019-07-15 ENCOUNTER — Ambulatory Visit (INDEPENDENT_AMBULATORY_CARE_PROVIDER_SITE_OTHER): Payer: PPO | Admitting: Psychology

## 2019-07-15 DIAGNOSIS — F331 Major depressive disorder, recurrent, moderate: Secondary | ICD-10-CM | POA: Diagnosis not present

## 2019-07-15 DIAGNOSIS — F4310 Post-traumatic stress disorder, unspecified: Secondary | ICD-10-CM | POA: Diagnosis not present

## 2019-07-15 DIAGNOSIS — F4323 Adjustment disorder with mixed anxiety and depressed mood: Secondary | ICD-10-CM

## 2019-07-20 ENCOUNTER — Ambulatory Visit (INDEPENDENT_AMBULATORY_CARE_PROVIDER_SITE_OTHER): Payer: PPO | Admitting: Psychology

## 2019-07-20 DIAGNOSIS — F331 Major depressive disorder, recurrent, moderate: Secondary | ICD-10-CM | POA: Diagnosis not present

## 2019-07-20 DIAGNOSIS — F4310 Post-traumatic stress disorder, unspecified: Secondary | ICD-10-CM

## 2019-07-20 DIAGNOSIS — F4323 Adjustment disorder with mixed anxiety and depressed mood: Secondary | ICD-10-CM

## 2019-07-25 ENCOUNTER — Ambulatory Visit: Payer: PPO | Admitting: Psychology

## 2019-07-26 ENCOUNTER — Ambulatory Visit (INDEPENDENT_AMBULATORY_CARE_PROVIDER_SITE_OTHER): Payer: PPO | Admitting: Psychology

## 2019-07-26 DIAGNOSIS — F331 Major depressive disorder, recurrent, moderate: Secondary | ICD-10-CM | POA: Diagnosis not present

## 2019-07-26 DIAGNOSIS — F4323 Adjustment disorder with mixed anxiety and depressed mood: Secondary | ICD-10-CM

## 2019-07-26 DIAGNOSIS — F4310 Post-traumatic stress disorder, unspecified: Secondary | ICD-10-CM | POA: Diagnosis not present

## 2019-08-01 ENCOUNTER — Ambulatory Visit: Payer: PPO

## 2019-08-09 ENCOUNTER — Ambulatory Visit: Payer: PPO | Admitting: Psychology

## 2019-08-16 ENCOUNTER — Ambulatory Visit (INDEPENDENT_AMBULATORY_CARE_PROVIDER_SITE_OTHER): Payer: PPO | Admitting: Psychology

## 2019-08-16 DIAGNOSIS — F4323 Adjustment disorder with mixed anxiety and depressed mood: Secondary | ICD-10-CM | POA: Diagnosis not present

## 2019-08-16 DIAGNOSIS — F331 Major depressive disorder, recurrent, moderate: Secondary | ICD-10-CM

## 2019-08-16 DIAGNOSIS — F4310 Post-traumatic stress disorder, unspecified: Secondary | ICD-10-CM

## 2019-08-29 ENCOUNTER — Ambulatory Visit: Payer: PPO | Admitting: Psychology

## 2019-09-04 ENCOUNTER — Other Ambulatory Visit: Payer: Self-pay | Admitting: Internal Medicine

## 2019-09-06 ENCOUNTER — Ambulatory Visit (INDEPENDENT_AMBULATORY_CARE_PROVIDER_SITE_OTHER): Payer: PPO | Admitting: Psychology

## 2019-09-06 DIAGNOSIS — F4310 Post-traumatic stress disorder, unspecified: Secondary | ICD-10-CM

## 2019-09-06 DIAGNOSIS — F331 Major depressive disorder, recurrent, moderate: Secondary | ICD-10-CM | POA: Diagnosis not present

## 2019-09-06 DIAGNOSIS — F4323 Adjustment disorder with mixed anxiety and depressed mood: Secondary | ICD-10-CM | POA: Diagnosis not present

## 2019-09-07 ENCOUNTER — Other Ambulatory Visit: Payer: Self-pay | Admitting: Internal Medicine

## 2019-09-13 ENCOUNTER — Telehealth: Payer: Self-pay | Admitting: Internal Medicine

## 2019-09-13 NOTE — Progress Notes (Signed)
  Chronic Care Management   Outreach Note  09/13/2019 Name: Brandi Ramsey MRN: [id number] DOB: [birth year]  Referred by: Janith Lima, MD Reason for referral : No chief complaint on file.   An unsuccessful telephone outreach was attempted today. The patient was referred to the pharmacist for assistance with care management and care coordination.   Follow Up Plan:   [name] [employer] Scheduler

## 2019-09-14 ENCOUNTER — Ambulatory Visit: Payer: PPO | Admitting: Psychology

## 2019-09-19 ENCOUNTER — Telehealth: Payer: Self-pay | Admitting: Internal Medicine

## 2019-09-19 NOTE — Progress Notes (Signed)
  Chronic Care Management   Outreach Note  09/19/2019 Name: ODELLA MINELLI MRN: [id number] DOB: 08-10-1950  Referred by: Janith Lima, MD Reason for referral : No chief complaint on file.   A second unsuccessful telephone outreach was attempted today. The patient was referred to pharmacist for assistance with care management and care coordination.  Follow Up Plan:   [name] [employer] Scheduler

## 2019-09-20 ENCOUNTER — Ambulatory Visit (INDEPENDENT_AMBULATORY_CARE_PROVIDER_SITE_OTHER): Payer: PPO | Admitting: Psychology

## 2019-09-20 DIAGNOSIS — F331 Major depressive disorder, recurrent, moderate: Secondary | ICD-10-CM

## 2019-09-20 DIAGNOSIS — F4310 Post-traumatic stress disorder, unspecified: Secondary | ICD-10-CM

## 2019-09-20 DIAGNOSIS — F4323 Adjustment disorder with mixed anxiety and depressed mood: Secondary | ICD-10-CM | POA: Diagnosis not present

## 2019-09-27 ENCOUNTER — Ambulatory Visit: Payer: PPO | Admitting: Psychology

## 2019-09-29 ENCOUNTER — Other Ambulatory Visit: Payer: Self-pay | Admitting: Internal Medicine

## 2019-09-29 DIAGNOSIS — G40309 Generalized idiopathic epilepsy and epileptic syndromes, not intractable, without status epilepticus: Secondary | ICD-10-CM

## 2019-09-30 ENCOUNTER — Other Ambulatory Visit: Payer: Self-pay | Admitting: Internal Medicine

## 2019-09-30 DIAGNOSIS — G40309 Generalized idiopathic epilepsy and epileptic syndromes, not intractable, without status epilepticus: Secondary | ICD-10-CM

## 2019-09-30 NOTE — Telephone Encounter (Signed)
Received after hours call that pt ran out of phenobarb yesterday, needing refill. Reviewed chart and see that our team does prescribe. Will refill 30 d supply.

## 2019-10-03 ENCOUNTER — Ambulatory Visit: Payer: PPO | Admitting: Psychology

## 2019-10-07 ENCOUNTER — Other Ambulatory Visit: Payer: Self-pay | Admitting: Internal Medicine

## 2019-10-07 DIAGNOSIS — F331 Major depressive disorder, recurrent, moderate: Secondary | ICD-10-CM

## 2019-10-11 ENCOUNTER — Ambulatory Visit (INDEPENDENT_AMBULATORY_CARE_PROVIDER_SITE_OTHER): Payer: PPO | Admitting: Psychology

## 2019-10-11 DIAGNOSIS — F331 Major depressive disorder, recurrent, moderate: Secondary | ICD-10-CM

## 2019-10-11 DIAGNOSIS — F4310 Post-traumatic stress disorder, unspecified: Secondary | ICD-10-CM

## 2019-10-17 ENCOUNTER — Ambulatory Visit: Payer: PPO | Admitting: Internal Medicine

## 2019-10-18 ENCOUNTER — Ambulatory Visit: Payer: PPO | Admitting: Psychology

## 2019-10-18 ENCOUNTER — Encounter: Payer: Self-pay | Admitting: Internal Medicine

## 2019-10-18 ENCOUNTER — Other Ambulatory Visit: Payer: Self-pay

## 2019-10-18 ENCOUNTER — Ambulatory Visit: Payer: PPO | Admitting: Internal Medicine

## 2019-10-18 ENCOUNTER — Ambulatory Visit (INDEPENDENT_AMBULATORY_CARE_PROVIDER_SITE_OTHER): Payer: PPO | Admitting: Internal Medicine

## 2019-10-18 VITALS — BP 132/88 | HR 85 | Temp 98.6°F | Ht 63.5 in | Wt 159.0 lb

## 2019-10-18 DIAGNOSIS — Z0000 Encounter for general adult medical examination without abnormal findings: Secondary | ICD-10-CM | POA: Diagnosis not present

## 2019-10-18 DIAGNOSIS — Z9884 Bariatric surgery status: Secondary | ICD-10-CM

## 2019-10-18 DIAGNOSIS — F331 Major depressive disorder, recurrent, moderate: Secondary | ICD-10-CM | POA: Diagnosis not present

## 2019-10-18 DIAGNOSIS — E559 Vitamin D deficiency, unspecified: Secondary | ICD-10-CM

## 2019-10-18 DIAGNOSIS — R7303 Prediabetes: Secondary | ICD-10-CM

## 2019-10-18 DIAGNOSIS — G40309 Generalized idiopathic epilepsy and epileptic syndromes, not intractable, without status epilepticus: Secondary | ICD-10-CM | POA: Diagnosis not present

## 2019-10-18 DIAGNOSIS — I10 Essential (primary) hypertension: Secondary | ICD-10-CM | POA: Diagnosis not present

## 2019-10-18 DIAGNOSIS — Z1231 Encounter for screening mammogram for malignant neoplasm of breast: Secondary | ICD-10-CM

## 2019-10-18 DIAGNOSIS — E785 Hyperlipidemia, unspecified: Secondary | ICD-10-CM | POA: Diagnosis not present

## 2019-10-18 LAB — LIPID PANEL
Cholesterol: 169 mg/dL (ref 0–200)
HDL: 56.4 mg/dL (ref >39.00)
LDL Cholesterol: 95 mg/dL (ref 0–99)
NonHDL: 112.95
Total CHOL/HDL Ratio: 3
Triglycerides: 92 mg/dL (ref 0.0–149.0)
VLDL: 18.4 mg/dL (ref 0.0–40.0)

## 2019-10-18 LAB — CBC WITH DIFFERENTIAL/PLATELET
Basophils Absolute: 0 10*3/uL (ref 0.0–0.1)
Basophils Relative: 0.5 % (ref 0.0–3.0)
Eosinophils Absolute: 0.1 10*3/uL (ref 0.0–0.7)
Eosinophils Relative: 0.7 % (ref 0.0–5.0)
HCT: 41.8 % (ref 36.0–46.0)
Hemoglobin: 14 g/dL (ref 12.0–15.0)
Lymphocytes Relative: 20.2 % (ref 12.0–46.0)
Lymphs Abs: 1.6 10*3/uL (ref 0.7–4.0)
MCHC: 33.6 g/dL (ref 30.0–36.0)
MCV: 86.3 fl (ref 78.0–100.0)
Monocytes Absolute: 0.6 10*3/uL (ref 0.1–1.0)
Monocytes Relative: 7.7 % (ref 3.0–12.0)
Neutro Abs: 5.5 10*3/uL (ref 1.4–7.7)
Neutrophils Relative %: 70.9 % (ref 43.0–77.0)
Platelets: 220 10*3/uL (ref 150.0–400.0)
RBC: 4.84 Mil/uL (ref 3.87–5.11)
RDW: 13.9 % (ref 11.5–15.5)
WBC: 7.7 10*3/uL (ref 4.0–10.5)

## 2019-10-18 LAB — POCT GLYCOSYLATED HEMOGLOBIN (HGB A1C): Hemoglobin A1C: 5.4 % (ref 4.0–5.6)

## 2019-10-18 LAB — VITAMIN D 25 HYDROXY (VIT D DEFICIENCY, FRACTURES): VITD: 36.32 ng/mL (ref 30.00–100.00)

## 2019-10-18 LAB — HEPATIC FUNCTION PANEL
ALT: 21 U/L (ref 0–35)
AST: 27 U/L (ref 0–37)
Albumin: 4 g/dL (ref 3.5–5.2)
Alkaline Phosphatase: 89 U/L (ref 39–117)
Bilirubin, Direct: 0.1 mg/dL (ref 0.0–0.3)
Total Bilirubin: 0.3 mg/dL (ref 0.2–1.2)
Total Protein: 6.9 g/dL (ref 6.0–8.3)

## 2019-10-18 LAB — VITAMIN B12: Vitamin B-12: 1262 pg/mL — ABNORMAL HIGH (ref 211–911)

## 2019-10-18 LAB — IBC PANEL
Iron: 76 ug/dL (ref 42–145)
Saturation Ratios: 18 % — ABNORMAL LOW (ref 20.0–50.0)
Transferrin: 302 mg/dL (ref 212.0–360.0)

## 2019-10-18 LAB — BASIC METABOLIC PANEL
BUN: 22 mg/dL (ref 6–23)
CO2: 28 mEq/L (ref 19–32)
Calcium: 9.3 mg/dL (ref 8.4–10.5)
Chloride: 101 mEq/L (ref 96–112)
Creatinine, Ser: 0.79 mg/dL (ref 0.40–1.20)
GFR: 72.42 mL/min (ref >60.00)
Glucose, Bld: 103 mg/dL — ABNORMAL HIGH (ref 70–99)
Potassium: 4.5 mEq/L (ref 3.5–5.1)
Sodium: 135 mEq/L (ref 135–145)

## 2019-10-18 LAB — FERRITIN: Ferritin: 15 ng/mL (ref 10.0–291.0)

## 2019-10-18 LAB — FOLATE: Folate: >23.7 ng/mL (ref >5.9)

## 2019-10-18 LAB — TSH: TSH: 1 u[IU]/mL (ref 0.35–4.50)

## 2019-10-18 MED ORDER — BUPROPION HCL ER (XL) 150 MG PO TB24: 150.0000 mg | ORAL_TABLET | Freq: Every day | ORAL | 0 refills | Status: DC

## 2019-10-18 MED ORDER — ROSUVASTATIN CALCIUM 5 MG PO TABS: 5.0000 mg | ORAL_TABLET | Freq: Every day | ORAL | 1 refills | Status: DC

## 2019-10-18 NOTE — Patient Instructions (Signed)
Health Maintenance, Female Adopting a healthy lifestyle and getting preventive care are important in promoting health and wellness. Ask your health care provider about:  The right schedule for you to have regular tests and exams.  Things you can do on your own to prevent diseases and keep yourself healthy. What should I know about diet, weight, and exercise? Eat a healthy diet   Eat a diet that includes plenty of vegetables, fruits, low-fat dairy products, and lean protein.  Do not eat a lot of foods that are high in solid fats, added sugars, or sodium. Maintain a healthy weight Body mass index (BMI) is used to identify weight problems. It estimates body fat based on height and weight. Your health care provider can help determine your BMI and help you achieve or maintain a healthy weight. Get regular exercise Get regular exercise. This is one of the most important things you can do for your health. Most adults should:  Exercise for at least 150 minutes each week. The exercise should increase your heart rate and make you sweat (moderate-intensity exercise).  Do strengthening exercises at least twice a week. This is in addition to the moderate-intensity exercise.  Spend less time sitting. Even light physical activity can be beneficial. Watch cholesterol and blood lipids Have your blood tested for lipids and cholesterol at 68 years of age, then have this test every 5 years. Have your cholesterol levels checked more often if:  Your lipid or cholesterol levels are high.  You are older than 68 years of age.  You are at high risk for heart disease. What should I know about cancer screening? Depending on your health history and family history, you may need to have cancer screening at various ages. This may include screening for:  Breast cancer.  Cervical cancer.  Colorectal cancer.  Skin cancer.  Lung cancer. What should I know about heart disease, diabetes, and high blood  pressure? Blood pressure and heart disease  High blood pressure causes heart disease and increases the risk of stroke. This is more likely to develop in people who have high blood pressure readings, are of African descent, or are overweight.  Have your blood pressure checked: ? Every 3-5 years if you are 18-39 years of age. ? Every year if you are 40 years old or older. Diabetes Have regular diabetes screenings. This checks your fasting blood sugar level. Have the screening done:  Once every three years after age 40 if you are at a normal weight and have a low risk for diabetes.  More often and at a younger age if you are overweight or have a high risk for diabetes. What should I know about preventing infection? Hepatitis B If you have a higher risk for hepatitis B, you should be screened for this virus. Talk with your health care provider to find out if you are at risk for hepatitis B infection. Hepatitis C Testing is recommended for:  Everyone born from 1945 through 1965.  Anyone with known risk factors for hepatitis C. Sexually transmitted infections (STIs)  Get screened for STIs, including gonorrhea and chlamydia, if: ? You are sexually active and are younger than 68 years of age. ? You are older than 68 years of age and your health care provider tells you that you are at risk for this type of infection. ? Your sexual activity has changed since you were last screened, and you are at increased risk for chlamydia or gonorrhea. Ask your health care provider if   you are at risk.  Ask your health care provider about whether you are at high risk for HIV. Your health care provider may recommend a prescription medicine to help prevent HIV infection. If you choose to take medicine to prevent HIV, you should first get tested for HIV. You should then be tested every 3 months for as long as you are taking the medicine. Pregnancy  If you are about to stop having your period (premenopausal) and  you may become pregnant, seek counseling before you get pregnant.  Take 400 to 800 micrograms (mcg) of folic acid every day if you become pregnant.  Ask for birth control (contraception) if you want to prevent pregnancy. Osteoporosis and menopause Osteoporosis is a disease in which the bones lose minerals and strength with aging. This can result in bone fractures. If you are 65 years old or older, or if you are at risk for osteoporosis and fractures, ask your health care provider if you should:  Be screened for bone loss.  Take a calcium or vitamin D supplement to lower your risk of fractures.  Be given hormone replacement therapy (HRT) to treat symptoms of menopause. Follow these instructions at home: Lifestyle  Do not use any products that contain nicotine or tobacco, such as cigarettes, e-cigarettes, and chewing tobacco. If you need help quitting, ask your health care provider.  Do not use street drugs.  Do not share needles.  Ask your health care provider for help if you need support or information about quitting drugs. Alcohol use  Do not drink alcohol if: ? Your health care provider tells you not to drink. ? You are pregnant, may be pregnant, or are planning to become pregnant.  If you drink alcohol: ? Limit how much you use to 0-1 drink a day. ? Limit intake if you are breastfeeding.  Be aware of how much alcohol is in your drink. In the [place] one drink equals one 12 oz bottle of beer (355 mL), one 5 oz glass of wine (148 mL), or one 1 oz glass of hard liquor (44 mL). General instructions  Schedule regular health, dental, and eye exams.  Stay current with your vaccines.  Tell your health care provider if: ? You often feel depressed. ? You have ever been abused or do not feel safe at home. Summary  Adopting a healthy lifestyle and getting preventive care are important in promoting health and wellness.  Follow your health care provider's instructions about healthy  diet, exercising, and getting tested or screened for diseases.  Follow your health care provider's instructions on monitoring your cholesterol and blood pressure. This information is not intended to replace advice given to you by your health care provider. Make sure you discuss any questions you have with your health care provider. Document Revised: 06/02/2018 Document Reviewed: 06/02/2018 Elsevier Patient Education  2020 [employer]vier Inc.  

## 2019-10-18 NOTE — Progress Notes (Signed)
Subjective:  Patient ID: Brandi Ramsey, female    DOB: 1950/07/03  Age: 69 y.o. MRN: [id number]  CC: Annual Exam and Hyperlipidemia  This visit occurred during the SARS-CoV-2 public health emergency.  Safety protocols were in place, including screening questions prior to the visit, additional usage of staff PPE, and extensive cleaning of exam room while observing appropriate contact time as indicated for disinfecting solutions.    HPI [name] presents for a CPX.  She complains of weight gain, fatigue, apathy, anhedonia, crying spells, and feeling hopeless. She is taking cymbalta but would like to add to this.  Outpatient Medications Prior to Visit  Medication Sig Dispense Refill  . acetaminophen (TYLENOL) 325 MG tablet Take 650 mg by mouth every 6 (six) hours as needed for mild pain or fever.    [name] atorvastatin (LIPITOR) 80 MG tablet TAKE 1 TABLET BY MOUTH EVERY DAY AT 6PM 90 tablet 1  . clonazePAM (KLONOPIN) 1 MG tablet Take 1 tablet (1 mg total) by mouth 2 (two) times daily as needed for anxiety. 60 tablet 3  . DULoxetine (CYMBALTA) 60 MG capsule TAKE 1 CAPSULE BY MOUTH EVERY DAY 90 capsule 1  . ibuprofen (ADVIL) 200 MG tablet Take 200 mg by mouth every 6 (six) hours as needed for fever or mild pain.     . meclizine (ANTIVERT) 12.5 MG tablet TAKE 1 TABLET BY MOUTH 3 (THREE) TIMES DAILY AS NEEDED FOR DIZZINESS. 45 tablet 1  . Multiple Vitamin (MULTIVITAMIN) tablet Take 1 tablet by mouth daily.    . pantoprazole (PROTONIX) 40 MG tablet Take 1 tablet (40 mg total) by mouth 2 (two) times daily. 180 tablet 1  . PHENobarbital (LUMINAL) 97.2 MG tablet TAKE 1 TABLET BY MOUTH EVERYDAY AT BEDTIME 30 tablet 0   No facility-administered medications prior to visit.    ROS Review of Systems  Constitutional: Positive for fatigue and unexpected weight change (wt gain). Negative for appetite change, diaphoresis and fever.  HENT: Negative.   Eyes: Negative for visual disturbance.  Respiratory:  Negative for cough, chest tightness, shortness of breath and wheezing.   Cardiovascular: Negative for chest pain, palpitations and leg swelling.  Gastrointestinal: Negative for abdominal pain, diarrhea, nausea and vomiting.  Endocrine: Negative.   Genitourinary: Negative.  Negative for decreased urine volume, difficulty urinating, dysuria, hematuria and urgency.  Musculoskeletal: Negative.  Negative for arthralgias and myalgias.  Skin: Negative.  Negative for color change and pallor.  Neurological: Negative.  Negative for dizziness, seizures, weakness, light-headedness and headaches.  Hematological: Negative for adenopathy. Does not bruise/bleed easily.  Psychiatric/Behavioral: Positive for dysphoric mood. Negative for behavioral problems, confusion, decreased concentration, hallucinations, self-injury, sleep disturbance and suicidal ideas. The patient is nervous/anxious. The patient is not hyperactive.        + anhedonia, apathy    Objective:  BP 132/88 (BP Location: Left Arm, Patient Position: Sitting, Cuff Size: Normal)   Pulse 85   Temp 98.6 F (37 C) (Oral)   Ht 5' 3.5" (1.613 m)   Wt 159 lb (72.1 kg)   SpO2 99%   BMI 27.72 kg/m   BP Readings from Last 3 Encounters:  10/18/19 132/88  02/11/19 120/77  01/17/19 120/80    Wt Readings from Last 3 Encounters:  10/18/19 159 lb (72.1 kg)  04/18/19 148 lb (67.1 kg)  01/20/19 145 lb (65.8 kg)    Physical Exam Vitals reviewed.  Constitutional:      Appearance: She is not ill-appearing.  HENT:  Nose: Nose normal.     Mouth/Throat:     Mouth: Mucous membranes are moist.  Eyes:     General: No scleral icterus.    Conjunctiva/sclera: Conjunctivae normal.  Cardiovascular:     Rate and Rhythm: Normal rate and regular rhythm.     Pulses: Normal pulses.     Heart sounds: No murmur.  Pulmonary:     Effort: Pulmonary effort is normal.     Breath sounds: No stridor. No wheezing, rhonchi or rales.  Abdominal:     General:  Abdomen is flat. Bowel sounds are normal. There is no distension.     Palpations: Abdomen is soft. There is no hepatomegaly, splenomegaly or mass.     Tenderness: There is no abdominal tenderness.  Musculoskeletal:        General: Normal range of motion.     Cervical back: Neck supple.     Right lower leg: No edema.     Left lower leg: No edema.  Lymphadenopathy:     Cervical: No cervical adenopathy.  Skin:    General: Skin is warm and dry.     Coloration: Skin is not pale.  Neurological:     General: No focal deficit present.     Mental Status: She is alert and oriented to person, place, and time. Mental status is at baseline.  Psychiatric:        Attention and Perception: Attention normal. She is attentive.        Mood and Affect: Mood is anxious and depressed. Mood is not elated. Affect is flat, angry and tearful. Affect is not blunt.        Speech: Speech normal.        Behavior: Behavior normal.        Thought Content: Thought content normal. Thought content is not paranoid or delusional. Thought content does not include homicidal or suicidal ideation.        Cognition and Memory: Cognition normal.        Judgment: Judgment normal.     Lab Results  Component Value Date   WBC 7.7 10/18/2019   HGB 14.0 10/18/2019   HCT 41.8 10/18/2019   PLT 220.0 10/18/2019   GLUCOSE 103 (H) 10/18/2019   CHOL 169 10/18/2019   TRIG 92.0 10/18/2019   HDL 56.40 10/18/2019   LDLCALC 95 10/18/2019   ALT 21 10/18/2019   AST 27 10/18/2019   NA 135 10/18/2019   K 4.5 10/18/2019   CL 101 10/18/2019   CREATININE 0.79 10/18/2019   BUN 22 10/18/2019   CO2 28 10/18/2019   TSH 1.00 10/18/2019   HGBA1C 5.4 10/18/2019   MICROALBUR <0.7 01/17/2019    DG Chest Portable 1 View  Result Date: 02/11/2019 CLINICAL DATA:  Shortness of breath, suspected COVID-19 EXAM: PORTABLE CHEST 1 VIEW COMPARISON:  Radiograph 07/24/2017, CT 12/05/2015 FINDINGS: Hypoventilatory changes in the bases. No  consolidation, features of edema, pneumothorax, or effusion. Pulmonary vascularity is normally distributed. The cardiomediastinal contours are unremarkable. No acute osseous or soft tissue abnormality. Degenerative changes are present in the and imaged spine and shoulders. IMPRESSION: Low volumes with no other acute cardiopulmonary abnormality Electronically Signed   By: Lovena Le M.D.   On: 02/11/2019 16:35    Assessment & Plan:   Lusine was seen today for annual exam and hyperlipidemia.  Diagnoses and all orders for this visit:  Benign essential HTN- Her BP is well controlled. Lytes and renal function are normal. -  CBC with Differential/Platelet; Future -     Basic metabolic panel; Future -     TSH; Future -     TSH -     Basic metabolic panel -     CBC with Differential/Platelet  Avitaminosis D- Her Vit D level is normal now. -     VITAMIN D 25 Hydroxy (Vit-D Deficiency, Fractures); Future -     VITAMIN D 25 Hydroxy (Vit-D Deficiency, Fractures)  Hyperlipidemia with target LDL less than 100- She has achieved her LDL goal and is doing well on the statin. -     Lipid panel; Future -     TSH; Future -     Hepatic function panel; Future -     Hepatic function panel -     TSH -     Lipid panel -     Discontinue: rosuvastatin (CRESTOR) 5 MG tablet; Take 1 tablet (5 mg total) by mouth daily.  Prediabetes- Her A1C is 5.4%. -     Cancel: Hemoglobin A1c; Future -     POCT glycosylated hemoglobin (Hb A1C)  Visit for screening mammogram -     MM DIGITAL SCREENING BILATERAL; Future  Status post laparoscopic sleeve gastrectomy- I will monitor her for Vit deficiencies. -     IBC panel; Future -     Vitamin B12; Future -     Folate; Future -     Ferritin; Future -     Vitamin B1; Future -     Zinc; Future -     Zinc -     Vitamin B1 -     Ferritin -     Folate -     Vitamin B12 -     IBC panel  Moderate episode of recurrent major depressive disorder ([place])- Will add  bupropion to the SNRI to support dopamine. -     buPROPion (WELLBUTRIN XL) 150 MG 24 hr tablet; Take 1 tablet (150 mg total) by mouth daily.  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 Will continue.   I have discontinued [name]. Laris's rosuvastatin. I am also having her start on buPROPion. Additionally, I am having her maintain her multivitamin, ibuprofen, acetaminophen, meclizine, clonazePAM, pantoprazole, atorvastatin, PHENobarbital, and DULoxetine.  Meds ordered this encounter  Medications  . buPROPion (WELLBUTRIN XL) 150 MG 24 hr tablet    Sig: Take 1 tablet (150 mg total) by mouth daily.    Dispense:  90 tablet    Refill:  0  . DISCONTD: rosuvastatin (CRESTOR) 5 MG tablet    Sig: Take 1 tablet (5 mg total) by mouth daily.    Dispense:  90 tablet    Refill:  1   In addition to time spent on CPE, I spent 50 minutes in preparing to see the patient by review of recent labs, imaging and procedures, obtaining and reviewing separately obtained history, communicating with the patient and family or caregiver, ordering medications, tests or procedures, and documenting clinical information in the EHR including the differential Dx, treatment, and any further evaluation and other management of 1. Benign essential HTN 2. Avitaminosis D 3. Hyperlipidemia with target LDL less than 100 4. Prediabetes 5. Status post laparoscopic sleeve gastrectomy 6. Moderate episode of recurrent major depressive disorder ([place]) 7. Generalized epilepsy ([place])     Follow-up: Return in about 3 months (around 01/17/2020).  Scarlette Calico, MD

## 2019-10-19 DIAGNOSIS — Z0000 Encounter for general adult medical examination without abnormal findings: Secondary | ICD-10-CM | POA: Insufficient documentation

## 2019-10-19 NOTE — Assessment & Plan Note (Signed)
Exam completed Labs reviewed Vaccines reviewed Screening for colon cancer and cervical cancer is up-to-date She is referred for breast cancer screening Patient education was given

## 2019-10-21 ENCOUNTER — Encounter: Payer: Self-pay | Admitting: Internal Medicine

## 2019-10-21 ENCOUNTER — Ambulatory Visit (INDEPENDENT_AMBULATORY_CARE_PROVIDER_SITE_OTHER): Payer: PPO | Admitting: Psychology

## 2019-10-21 DIAGNOSIS — F4310 Post-traumatic stress disorder, unspecified: Secondary | ICD-10-CM | POA: Diagnosis not present

## 2019-10-21 DIAGNOSIS — F4323 Adjustment disorder with mixed anxiety and depressed mood: Secondary | ICD-10-CM | POA: Diagnosis not present

## 2019-10-21 DIAGNOSIS — F331 Major depressive disorder, recurrent, moderate: Secondary | ICD-10-CM

## 2019-10-21 LAB — VITAMIN B1: Vitamin B1 (Thiamine): 220 nmol/L — ABNORMAL HIGH (ref 8–30)

## 2019-10-21 LAB — ZINC: Zinc: 93 ug/dL (ref 60–130)

## 2019-10-28 ENCOUNTER — Other Ambulatory Visit: Payer: Self-pay | Admitting: Internal Medicine

## 2019-10-28 DIAGNOSIS — G40309 Generalized idiopathic epilepsy and epileptic syndromes, not intractable, without status epilepticus: Secondary | ICD-10-CM

## 2019-10-28 NOTE — Telephone Encounter (Signed)
1.Medication Requested:PHENobarbital (LUMINAL) 97.2 MG tablet  2. Pharmacy (Name, Street, City):[id number] - [place], [place] - [address]  3. On Med List: Yes   4. Last Visit with PCP: 4.27.21  5. Next visit date with PCP: n/a   Agent: Please be advised that RX refills may take up to 3 business days. We ask that you follow-up with your pharmacy.

## 2019-11-02 ENCOUNTER — Ambulatory Visit (INDEPENDENT_AMBULATORY_CARE_PROVIDER_SITE_OTHER): Payer: PPO | Admitting: Psychology

## 2019-11-02 ENCOUNTER — Telehealth: Payer: Self-pay

## 2019-11-02 DIAGNOSIS — F411 Generalized anxiety disorder: Secondary | ICD-10-CM

## 2019-11-02 DIAGNOSIS — F331 Major depressive disorder, recurrent, moderate: Secondary | ICD-10-CM

## 2019-11-02 DIAGNOSIS — F4310 Post-traumatic stress disorder, unspecified: Secondary | ICD-10-CM | POA: Diagnosis not present

## 2019-11-02 NOTE — Telephone Encounter (Signed)
[facility] in [place] faxed rf rq for clonazepam 1 mg tablet.

## 2019-11-04 MED ORDER — CLONAZEPAM 1 MG PO TABS: 1.0000 mg | ORAL_TABLET | Freq: Two times a day (BID) | ORAL | 0 refills | Status: DC | PRN

## 2019-11-04 NOTE — Telephone Encounter (Signed)
Clonazepam was last filled on 09/26/2019  Last visit with PCP was 10/18/2019.  Next appt not scheduled yet.   Please advise of refill in PCP absence.

## 2019-11-09 ENCOUNTER — Ambulatory Visit: Payer: PPO | Admitting: Psychology

## 2019-11-09 ENCOUNTER — Encounter: Payer: Self-pay | Admitting: Internal Medicine

## 2019-11-22 ENCOUNTER — Ambulatory Visit (INDEPENDENT_AMBULATORY_CARE_PROVIDER_SITE_OTHER): Payer: PPO | Admitting: Psychology

## 2019-11-22 DIAGNOSIS — F4310 Post-traumatic stress disorder, unspecified: Secondary | ICD-10-CM | POA: Diagnosis not present

## 2019-11-22 DIAGNOSIS — F331 Major depressive disorder, recurrent, moderate: Secondary | ICD-10-CM

## 2019-11-22 DIAGNOSIS — F4323 Adjustment disorder with mixed anxiety and depressed mood: Secondary | ICD-10-CM

## 2019-12-01 ENCOUNTER — Ambulatory Visit: Payer: PPO | Admitting: Psychology

## 2019-12-02 ENCOUNTER — Telehealth: Payer: Self-pay | Admitting: Internal Medicine

## 2019-12-02 NOTE — Progress Notes (Signed)
  Chronic Care Management   Outreach Note  12/02/2019 Name: Brandi Ramsey MRN: [id number] DOB: [birth year]  Referred by: Janith Lima, MD Reason for referral : No chief complaint on file.   An unsuccessful telephone outreach was attempted today. The patient was referred to the pharmacist for assistance with care management and care coordination. This note is not being shared with the patient for the following reason: To respect privacy (The patient or proxy has requested that the information not be shared).  Follow Up Plan:   Earney Hamburg [employer] Scheduler

## 2019-12-11 ENCOUNTER — Other Ambulatory Visit: Payer: Self-pay | Admitting: Internal Medicine

## 2019-12-11 DIAGNOSIS — F411 Generalized anxiety disorder: Secondary | ICD-10-CM

## 2019-12-25 ENCOUNTER — Other Ambulatory Visit: Payer: Self-pay | Admitting: Internal Medicine

## 2019-12-28 ENCOUNTER — Other Ambulatory Visit: Payer: Self-pay | Admitting: Internal Medicine

## 2019-12-28 DIAGNOSIS — F331 Major depressive disorder, recurrent, moderate: Secondary | ICD-10-CM

## 2019-12-29 ENCOUNTER — Other Ambulatory Visit: Payer: Self-pay | Admitting: Internal Medicine

## 2020-01-09 ENCOUNTER — Other Ambulatory Visit: Payer: Self-pay

## 2020-01-09 ENCOUNTER — Encounter: Payer: Self-pay | Admitting: Internal Medicine

## 2020-01-09 ENCOUNTER — Ambulatory Visit (INDEPENDENT_AMBULATORY_CARE_PROVIDER_SITE_OTHER): Payer: PPO | Admitting: Internal Medicine

## 2020-01-09 ENCOUNTER — Ambulatory Visit (INDEPENDENT_AMBULATORY_CARE_PROVIDER_SITE_OTHER): Payer: PPO

## 2020-01-09 VITALS — BP 130/80 | HR 77 | Temp 98.1°F | Resp 16 | Ht 64.0 in | Wt 153.0 lb

## 2020-01-09 VITALS — BP 130/80 | HR 77 | Temp 98.1°F | Resp 16 | Ht 64.0 in | Wt 153.6 lb

## 2020-01-09 DIAGNOSIS — Z0000 Encounter for general adult medical examination without abnormal findings: Secondary | ICD-10-CM | POA: Diagnosis not present

## 2020-01-09 DIAGNOSIS — F332 Major depressive disorder, recurrent severe without psychotic features: Secondary | ICD-10-CM | POA: Diagnosis not present

## 2020-01-09 DIAGNOSIS — Z23 Encounter for immunization: Secondary | ICD-10-CM

## 2020-01-09 DIAGNOSIS — F331 Major depressive disorder, recurrent, moderate: Secondary | ICD-10-CM

## 2020-01-09 MED ORDER — BREXPIPRAZOLE 0.25 MG PO TABS: 0.2500 mg | ORAL_TABLET | Freq: Every day | ORAL | 0 refills | Status: AC

## 2020-01-09 MED ORDER — BREXPIPRAZOLE 1 MG PO TABS: 0.5000 mg | ORAL_TABLET | Freq: Every day | ORAL | 0 refills | Status: AC

## 2020-01-09 MED ORDER — SHINGRIX 50 MCG/0.5ML IM SUSR: 0.5000 mL | Freq: Once | INTRAMUSCULAR | 1 refills | Status: AC

## 2020-01-09 MED ORDER — BREXPIPRAZOLE 2 MG PO TABS: 2.0000 mg | ORAL_TABLET | Freq: Every day | ORAL | 1 refills | Status: DC

## 2020-01-09 NOTE — Patient Instructions (Addendum)
Brandi Ramsey , Thank you for taking time to come for your Medicare Wellness Visit. I appreciate your ongoing commitment to your health goals. Please review the following plan we discussed and let me know if I can assist you in the future.   Screening recommendations/referrals: Colonoscopy: 09/10/2015; due every 3 years Mammogram: 03/19/2017; due every 2 years Bone Density: 10/15/2016 Recommended yearly ophthalmology/optometry visit for glaucoma screening and checkup Recommended yearly dental visit for hygiene and checkup  Vaccinations: Influenza vaccine: 03/09/1999 Pneumococcal vaccine: completed Tdap vaccine: 09/05/2015 Shingles vaccine: never done   Covid-19: completed  Advanced directives: Advance directive discussed with you today. Even though you declined this today please call our office should you change your mind and we can give you the proper paperwork for you to fill out.   Conditions/risks identified: Yes; Please continue to do your personal lifestyle choices by: daily care of teeth and gums, regular physical activity (goal should be 5 days a week for 30 minutes), eat a healthy diet, avoid tobacco and drug use, limiting any alcohol intake, taking a low-dose aspirin (if not allergic or have been advised by your provider otherwise) and taking vitamins and minerals as recommended by your provider. Continue doing brain stimulating activities (puzzles, reading, adult coloring books, staying active) to keep memory sharp. Continue to eat heart healthy diet (full of fruits, vegetables, whole grains, lean protein, water--limit salt, fat, and sugar intake) and increase physical activity as tolerated.  Next appointment: Please schedule your next Medicare Wellness Visit with your Nurse Health Advisor in 1 year.   Preventive Care 28 Years and Older, Female Preventive care refers to lifestyle choices and visits with your health care provider that can promote health and wellness. What does preventive  care include?  A yearly physical exam. This is also called an annual well check.  Dental exams once or twice a year.  Routine eye exams. Ask your health care provider how often you should have your eyes checked.  Personal lifestyle choices, including:  Daily care of your teeth and gums.  Regular physical activity.  Eating a healthy diet.  Avoiding tobacco and drug use.  Limiting alcohol use.  Practicing safe sex.  Taking low-dose aspirin every day.  Taking vitamin and mineral supplements as recommended by your health care provider. What happens during an annual well check? The services and screenings done by your health care provider during your annual well check will depend on your age, overall health, lifestyle risk factors, and family history of disease. Counseling  Your health care provider may ask you questions about your:  Alcohol use.  Tobacco use.  Drug use.  Emotional well-being.  Home and relationship well-being.  Sexual activity.  Eating habits.  History of falls.  Memory and ability to understand (cognition).  Work and work Statistician.  Reproductive health. Screening  You may have the following tests or measurements:  Height, weight, and BMI.  Blood pressure.  Lipid and cholesterol levels. These may be checked every 5 years, or more frequently if you are over 49 years old.  Skin check.  Lung cancer screening. You may have this screening every year starting at age 104 if you have a 30-pack-year history of smoking and currently smoke or have quit within the past 15 years.  Fecal occult blood test (FOBT) of the stool. You may have this test every year starting at age 11.  Flexible sigmoidoscopy or colonoscopy. You may have a sigmoidoscopy every 5 years or a colonoscopy every 10 years  starting at age 22.  Hepatitis C blood test.  Hepatitis B blood test.  Sexually transmitted disease (STD) testing.  Diabetes screening. This is done by  checking your blood sugar (glucose) after you have not eaten for a while (fasting). You may have this done every 1-3 years.  Bone density scan. This is done to screen for osteoporosis. You may have this done starting at age 19.  Mammogram. This may be done every 1-2 years. Talk to your health care provider about how often you should have regular mammograms. Talk with your health care provider about your test results, treatment options, and if necessary, the need for more tests. Vaccines  Your health care provider may recommend certain vaccines, such as:  Influenza vaccine. This is recommended every year.  Tetanus, diphtheria, and acellular pertussis (Tdap, Td) vaccine. You may need a Td booster every 10 years.  Zoster vaccine. You may need this after age 16.  Pneumococcal 13-valent conjugate (PCV13) vaccine. One dose is recommended after age 57.  Pneumococcal polysaccharide (PPSV23) vaccine. One dose is recommended after age 52. Talk to your health care provider about which screenings and vaccines you need and how often you need them. This information is not intended to replace advice given to you by your health care provider. Make sure you discuss any questions you have with your health care provider. Document Released: 07/06/2015 Document Revised: 02/27/2016 Document Reviewed: 04/10/2015 Elsevier Interactive Patient Education  2017 [place] Prevention in the Home Falls can cause injuries. They can happen to people of all ages. There are many things you can do to make your home safe and to help prevent falls. What can I do on the outside of my home?  Regularly fix the edges of walkways and driveways and fix any cracks.  Remove anything that might make you trip as you walk through a door, such as a raised step or threshold.  Trim any bushes or trees on the path to your home.  Use bright outdoor lighting.  Clear any walking paths of anything that might make someone trip,  such as rocks or tools.  Regularly check to see if handrails are loose or broken. Make sure that both sides of any steps have handrails.  Any raised decks and porches should have guardrails on the edges.  Have any leaves, snow, or ice cleared regularly.  Use sand or salt on walking paths during winter.  Clean up any spills in your garage right away. This includes oil or grease spills. What can I do in the bathroom?  Use night lights.  Install grab bars by the toilet and in the tub and shower. Do not use towel bars as grab bars.  Use non-skid mats or decals in the tub or shower.  If you need to sit down in the shower, use a plastic, non-slip stool.  Keep the floor dry. Clean up any water that spills on the floor as soon as it happens.  Remove soap buildup in the tub or shower regularly.  Attach bath mats securely with double-sided non-slip rug tape.  Do not have throw rugs and other things on the floor that can make you trip. What can I do in the bedroom?  Use night lights.  Make sure that you have a light by your bed that is easy to reach.  Do not use any sheets or blankets that are too big for your bed. They should not hang down onto the floor.  Have a  firm chair that has side arms. You can use this for support while you get dressed.  Do not have throw rugs and other things on the floor that can make you trip. What can I do in the kitchen?  Clean up any spills right away.  Avoid walking on wet floors.  Keep items that you use a lot in easy-to-reach places.  If you need to reach something above you, use a strong step stool that has a grab bar.  Keep electrical cords out of the way.  Do not use floor polish or wax that makes floors slippery. If you must use wax, use non-skid floor wax.  Do not have throw rugs and other things on the floor that can make you trip. What can I do with my stairs?  Do not leave any items on the stairs.  Make sure that there are  handrails on both sides of the stairs and use them. Fix handrails that are broken or loose. Make sure that handrails are as long as the stairways.  Check any carpeting to make sure that it is firmly attached to the stairs. Fix any carpet that is loose or worn.  Avoid having throw rugs at the top or bottom of the stairs. If you do have throw rugs, attach them to the floor with carpet tape.  Make sure that you have a light switch at the top of the stairs and the bottom of the stairs. If you do not have them, ask someone to add them for you. What else can I do to help prevent falls?  Wear shoes that:  Do not have high heels.  Have rubber bottoms.  Are comfortable and fit you well.  Are closed at the toe. Do not wear sandals.  If you use a stepladder:  Make sure that it is fully opened. Do not climb a closed stepladder.  Make sure that both sides of the stepladder are locked into place.  Ask someone to hold it for you, if possible.  Clearly mark and make sure that you can see:  Any grab bars or handrails.  First and last steps.  Where the edge of each step is.  Use tools that help you move around (mobility aids) if they are needed. These include:  Canes.  Walkers.  Scooters.  Crutches.  Turn on the lights when you go into a dark area. Replace any light bulbs as soon as they burn out.  Set up your furniture so you have a clear path. Avoid moving your furniture around.  If any of your floors are uneven, fix them.  If there are any pets around you, be aware of where they are.  Review your medicines with your doctor. Some medicines can make you feel dizzy. This can increase your chance of falling. Ask your doctor what other things that you can do to help prevent falls. This information is not intended to replace advice given to you by your health care provider. Make sure you discuss any questions you have with your health care provider. Document Released: 04/05/2009  Document Revised: 11/15/2015 Document Reviewed: 07/14/2014 Elsevier Interactive Patient Education  2017 [employer].

## 2020-01-09 NOTE — Progress Notes (Signed)
Subjective:  Patient ID: Brandi Ramsey, female    DOB: [birth year]  Age: 69 y.o. MRN: [id number]  CC: Depression  This visit occurred during the SARS-CoV-2 public health emergency.  Safety protocols were in place, including screening questions prior to the visit, additional usage of staff PPE, and extensive cleaning of exam room while observing appropriate contact time as indicated for disinfecting solutions.    HPI Brandi Ramsey presents for f/up - She complains of worsening symptoms of depression.  She has had multiple stressors related to her disabled Ramsey.  She complains of crying spells, feeling hopeless, feeling helpless, and anxiety.  She denies SI or HI.  She is sleeping well and tells me that her weight and appetite are stable.  Outpatient Medications Prior to Visit  Medication Sig Dispense Refill   acetaminophen (TYLENOL) 325 MG tablet Take 650 mg by mouth every 6 (six) hours as needed for mild pain or fever.     atorvastatin (LIPITOR) 80 MG tablet TAKE 1 TABLET BY MOUTH EVERY DAY AT 6PM 90 tablet 1   clonazePAM (KLONOPIN) 1 MG tablet TAKE 1 TABLET (1 MG TOTAL) BY MOUTH 2 (TWO) TIMES DAILY AS NEEDED FOR ANXIETY. 60 tablet 5   DULoxetine (CYMBALTA) 60 MG capsule TAKE 1 CAPSULE BY MOUTH EVERY DAY 90 capsule 1   ibuprofen (ADVIL) 200 MG tablet Take 200 mg by mouth every 6 (six) hours as needed for fever or mild pain.      meclizine (ANTIVERT) 12.5 MG tablet TAKE 1 TABLET BY MOUTH 3 (THREE) TIMES DAILY AS NEEDED FOR DIZZINESS. 45 tablet 1   Multiple Vitamin (MULTIVITAMIN) tablet Take 1 tablet by mouth daily.     pantoprazole (PROTONIX) 40 MG tablet TAKE 1 TABLET BY MOUTH TWICE A DAY 180 tablet 1   PHENobarbital (LUMINAL) 97.2 MG tablet TAKE 1 TABLET BY MOUTH EVERYDAY AT BEDTIME 30 tablet 5   buPROPion (WELLBUTRIN XL) 150 MG 24 hr tablet TAKE 1 TABLET BY MOUTH EVERY DAY 90 tablet 0   No facility-administered medications prior to visit.    ROS Review of Systems    Constitutional: Negative for appetite change, diaphoresis, fatigue and unexpected weight change.  HENT: Negative.   Eyes: Negative for visual disturbance.  Respiratory: Negative for cough, chest tightness, shortness of breath and wheezing.   Cardiovascular: Negative for chest pain, palpitations and leg swelling.  Gastrointestinal: Negative for abdominal pain, diarrhea, nausea and vomiting.  Endocrine: Negative.   Genitourinary: Negative.  Negative for difficulty urinating.  Musculoskeletal: Negative.   Skin: Negative.   Neurological: Negative.  Negative for dizziness, seizures, weakness and headaches.  Hematological: Negative for adenopathy. Does not bruise/bleed easily.  Psychiatric/Behavioral: Positive for decreased concentration and dysphoric mood. Negative for agitation, behavioral problems, confusion, hallucinations, self-injury, sleep disturbance and suicidal ideas. The patient is nervous/anxious. The patient is not hyperactive.     Objective:  BP 130/80 (BP Location: Left Arm, Patient Position: Sitting, Cuff Size: Normal)    Pulse 77    Temp 98.1 F (36.7 C) (Oral)    Resp 16    Ht 5\' 4"  (1.626 m)    Wt 153 lb (69.4 kg)    SpO2 96%    BMI 26.26 kg/m   BP Readings from Last 3 Encounters:  01/09/20 130/80  01/09/20 130/80  10/18/19 132/88    Wt Readings from Last 3 Encounters:  01/09/20 153 lb (69.4 kg)  01/09/20 153 lb 9.6 oz (69.7 kg)  10/18/19 159 lb (72.1 kg)  Physical Exam Vitals reviewed.  Constitutional:      Appearance: Normal appearance.  HENT:     Nose: Nose normal.     Mouth/Throat:     Mouth: Mucous membranes are moist.  Eyes:     General: No scleral icterus.    Conjunctiva/sclera: Conjunctivae normal.  Cardiovascular:     Rate and Rhythm: Normal rate and regular rhythm.     Heart sounds: No murmur heard.   Pulmonary:     Effort: Pulmonary effort is normal.     Breath sounds: No stridor. No wheezing, rhonchi or rales.  Abdominal:     General:  Abdomen is flat.     Palpations: There is no mass.     Tenderness: There is no abdominal tenderness.  Musculoskeletal:        General: Normal range of motion.     Cervical back: Neck supple.     Right lower leg: No edema.     Left lower leg: No edema.  Lymphadenopathy:     Cervical: No cervical adenopathy.  Skin:    General: Skin is warm and dry.     Coloration: Skin is not pale.  Neurological:     General: No focal deficit present.     Mental Status: She is alert and oriented to person, place, and time. Mental status is at baseline.  Psychiatric:        Attention and Perception: Attention and perception normal.        Mood and Affect: Mood is anxious. Affect is angry and tearful. Affect is not flat or inappropriate.        Speech: Speech normal. Speech is not rapid and pressured, delayed, slurred or tangential.        Behavior: Behavior normal. Behavior is cooperative.        Thought Content: Thought content normal. Thought content is not paranoid or delusional. Thought content does not include homicidal or suicidal ideation.        Cognition and Memory: Cognition normal.        Judgment: Judgment normal.     Lab Results  Component Value Date   WBC 7.7 10/18/2019   HGB 14.0 10/18/2019   HCT 41.8 10/18/2019   PLT 220.0 10/18/2019   GLUCOSE 103 (H) 10/18/2019   CHOL 169 10/18/2019   TRIG 92.0 10/18/2019   HDL 56.40 10/18/2019   LDLCALC 95 10/18/2019   ALT 21 10/18/2019   AST 27 10/18/2019   NA 135 10/18/2019   K 4.5 10/18/2019   CL 101 10/18/2019   CREATININE 0.79 10/18/2019   BUN 22 10/18/2019   CO2 28 10/18/2019   TSH 1.00 10/18/2019   HGBA1C 5.4 10/18/2019   MICROALBUR <0.7 01/17/2019    DG Chest Portable 1 View  Result Date: 02/11/2019 CLINICAL DATA:  Shortness of breath, suspected COVID-19 EXAM: PORTABLE CHEST 1 VIEW COMPARISON:  Radiograph 07/24/2017, CT 12/05/2015 FINDINGS: Hypoventilatory changes in the bases. No consolidation, features of edema,  pneumothorax, or effusion. Pulmonary vascularity is normally distributed. The cardiomediastinal contours are unremarkable. No acute osseous or soft tissue abnormality. Degenerative changes are present in the and imaged spine and shoulders. IMPRESSION: Low volumes with no other acute cardiopulmonary abnormality Electronically Signed   By: Lovena Le M.D.   On: 02/11/2019 16:35    Assessment & Plan:   Reagann was seen today for depression.  Diagnoses and all orders for this visit:  Severe episode of recurrent major depressive disorder, without psychotic features ([place])- I recommended  that she add an atypical antipsychotic to the duloxetine and that she increase the dose of bupropion.  She will continue seeing her psychotherapist. -     brexpiprazole (REXULTI) TABS tablet; Take 1 tablet (0.25 mg total) by mouth daily for 7 days. -     brexpiprazole (REXULTI) 1 MG TABS tablet; Take 0.5 tablets (0.5 mg total) by mouth daily for 7 days. -     brexpiprazole (REXULTI) 2 MG TABS tablet; Take 1 tablet (2 mg total) by mouth daily. -     buPROPion (WELLBUTRIN XL) 300 MG 24 hr tablet; Take 1 tablet (300 mg total) by mouth daily.  Need for shingles vaccine -     Zoster Vaccine Adjuvanted [facility]) injection; Inject 0.5 mLs into the muscle once for 1 dose.  Moderate episode of recurrent major depressive disorder (HCC) -     buPROPion (WELLBUTRIN XL) 300 MG 24 hr tablet; Take 1 tablet (300 mg total) by mouth daily.   I have discontinued [name]. Purdy's buPROPion. I am also having her start on brexpiprazole, brexpiprazole, brexpiprazole, Shingrix, and buPROPion. Additionally, I am having her maintain her multivitamin, ibuprofen, acetaminophen, meclizine, DULoxetine, PHENobarbital, clonazePAM, atorvastatin, and pantoprazole.  Meds ordered this encounter  Medications   brexpiprazole (REXULTI) TABS tablet    Sig: Take 1 tablet (0.25 mg total) by mouth daily for 7 days.    Dispense:  7 tablet    Refill:  0     brexpiprazole (REXULTI) 1 MG TABS tablet    Sig: Take 0.5 tablets (0.5 mg total) by mouth daily for 7 days.    Dispense:  7 tablet    Refill:  0   brexpiprazole (REXULTI) 2 MG TABS tablet    Sig: Take 1 tablet (2 mg total) by mouth daily.    Dispense:  90 tablet    Refill:  1   Zoster Vaccine Adjuvanted [facility]) injection    Sig: Inject 0.5 mLs into the muscle once for 1 dose.    Dispense:  0.5 mL    Refill:  1   buPROPion (WELLBUTRIN XL) 300 MG 24 hr tablet    Sig: Take 1 tablet (300 mg total) by mouth daily.    Dispense:  90 tablet    Refill:  0     Follow-up: Return in about 6 months (around 07/11/2020).  Scarlette Calico, MD

## 2020-01-09 NOTE — Progress Notes (Signed)
Subjective:   Brandi Ramsey is a 69 y.o. female who presents for Medicare Annual (Subsequent) preventive examination.  Review of Systems    NO ROS. Medicare Wellness Visit Cardiac Risk Factors include: advanced age (>63mn, >>40women);dyslipidemia;family history of premature cardiovascular disease;hypertension     Objective:    Today's Vitals   01/09/20 1211  BP: 130/80  Pulse: 77  Resp: 16  Temp: 98.1 F (36.7 C)  SpO2: 96%  Weight: 153 lb 9.6 oz (69.7 kg)  Height: 5' 4" (1.626 m)  PainSc: 0-No pain   Body mass index is 26.37 kg/m.  Advanced Directives 01/09/2020 04/18/2019 02/11/2019 01/05/2019 10/15/2018 11/25/2017 11/18/2016  Does Patient Have a Medical Advance Directive? _0  No No  Does patient want to make changes to medical advance directive? - - - Yes (ED - Information included in AVS) - - Yes (ED - Information included in AVS)  Would patient like information on creating a medical advance directive? Yes (MAU/Ambulatory/Procedural Areas - Information given) No - Patient declined No - Patient declined - Yes (MAU/Ambulatory/Procedural Areas - Information given) Yes (ED - Information included in AVS) -  Pre-existing out of facility DNR order (yellow form or pink MOST form) - - - - - - -    Current Medications (verified) Outpatient Encounter Medications as of 01/09/2020  Medication Sig  . clonazePAM (KLONOPIN) 1 MG tablet TAKE 1 TABLET (1 MG TOTAL) BY MOUTH 2 (TWO) TIMES DAILY AS NEEDED FOR ANXIETY.  .Marland Kitchenacetaminophen (TYLENOL) 325 MG tablet Take 650 mg by mouth every 6 (six) hours as needed for mild pain or fever.  .Marland Kitchenatorvastatin (LIPITOR) 80 MG tablet TAKE 1 TABLET BY MOUTH EVERY DAY AT 6PM  . buPROPion (WELLBUTRIN XL) 150 MG 24 hr tablet TAKE 1 TABLET BY MOUTH EVERY DAY  . DULoxetine (CYMBALTA) 60 MG capsule TAKE 1 CAPSULE BY MOUTH EVERY DAY  . ibuprofen (ADVIL) 200 MG tablet Take 200 mg by mouth every 6 (six) hours as needed for fever or mild pain.   . meclizine  (ANTIVERT) 12.5 MG tablet TAKE 1 TABLET BY MOUTH 3 (THREE) TIMES DAILY AS NEEDED FOR DIZZINESS.  . Multiple Vitamin (MULTIVITAMIN) tablet Take 1 tablet by mouth daily.  . pantoprazole (PROTONIX) 40 MG tablet TAKE 1 TABLET BY MOUTH TWICE A DAY  . PHENobarbital (LUMINAL) 97.2 MG tablet TAKE 1 TABLET BY MOUTH EVERYDAY AT BEDTIME  . [DISCONTINUED] atorvastatin (LIPITOR) 80 MG tablet TAKE 1 TABLET BY MOUTH EVERY DAY AT 6PM   No facility-administered encounter medications on file as of 01/09/2020.    Allergies (verified) Buprenorphine hcl, Hydromorphone, Meperidine, Metformin and related, Morphine and related, Pentazocine, Pentazocine lactate, Phenytoin, Demerol, Dilaudid [hydromorphone hcl], Doxycycline hyclate, Ibuprofen, Phenytoin sodium extended, Codeine, and Doxycycline hyclate   History: Past Medical History:  Diagnosis Date  . Anxiety   . Arthritis   . 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 Colon polyps   . Depression   . Diabetes mellitus without complication ([place]   . Fibrocystic breast   . Fibromyalgia   . 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 Gastric ulcer   . Hemorrhoids   . History of Doppler ultrasound    a. Carotid [place]: mild soft plaque origin ICA. 1-39% ICA stenosis.  .Marland KitchenHistory of echocardiogram    a. Echo 3/15: Moderate LVH, focal basal hypertrophy, EF 60-65%, normal wall motion  . History of nuclear stress test    a. Myoview 5/16:  Normal perfusion, EF 78%, low risk  . Hyperlipidemia   . Hypertension   . Obesity   . Orthostatic hypotension 01/20/2019  . Pneumonia   . Seizures ([place])   . Stroke ([place])   . Transient cerebral ischemia   . Vertigo, benign paroxysmal    Past Surgical History:  Procedure Laterality Date  . ABDOMINAL HYSTERECTOMY    . BREAST SURGERY     Bilateral mastectomy-TRAM  . FRACTURE SURGERY     tib/fib (R) leg  . LAPAROSCOPIC GASTRIC SLEEVE RESECTION  05/27/2016  . MASTECTOMY     Family History  Problem Relation Age of Onset  .  Raynaud syndrome Mother   . Colon polyps Mother   . Heart attack Mother   . Alzheimer's disease Father   . Diabetes Father   . Colon polyps Father   . Fragile X syndrome Son        Patient is carrier  . Diabetes Sister   . Diabetes Brother   . Cervical cancer Maternal Grandmother   . Breast cancer Maternal Grandmother   . Ovarian cancer Maternal Grandmother   . Heart failure Maternal Grandfather   . Diabetes Paternal Grandmother   . Colon polyps Sister   . Colon polyps Brother   . Colon cancer Maternal Aunt    Social History   Socioeconomic History  . Marital status: Married    Spouse name: Vira Agar  . Number of children: 2  . Years of education: Not on file  . Highest education level: Some college, no degree  Occupational History  . Occupation: retired  Tobacco Use  . Smoking status: Never Smoker  . Smokeless tobacco: Never Used  Vaping Use  . Vaping Use: Never used  Substance and Sexual Activity  . Alcohol use: No    Alcohol/week: 0.0 standard drinks  . Drug use: No  . Sexual activity: Yes    Partners: Male    Comment: 1st intercourse 69 yo-Fewer than 5 partners  Other Topics Concern  . 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 Difficulty of Paying Living Expenses: Very hard  Food Insecurity: Food Insecurity Present  . Worried About Charity fundraiser in the Last Year: Often true  . Ran Out of Food in the Last Year: Often true  Transportation Needs: No Transportation Needs  . Lack of Transportation (Medical): No  . Lack of Transportation (Non-Medical): No  Physical Activity: Sufficiently Active  . Days of Exercise per Week: 7 days  . Minutes of Exercise per Session: 60 min  Stress: Stress Concern Present  . Feeling of Stress : Very much  Social  Connections:   . Frequency of Communication with Friends and Family:   . Frequency of Social Gatherings with Friends and Family:   . Attends Religious Services:   . Active Member of Clubs or Organizations:   . Attends Archivist Meetings:   [name] Marital Status:     Tobacco Counseling Counseling given: No   Clinical Intake:  Pre-visit preparation completed: Yes  Pain : No/denies pain Pain Score: 0-No pain     BMI - recorded: 26.37 Nutritional Status: BMI > 30  Obese Nutritional Risks: None Diabetes: No  How often do you need to  have someone help you when you read instructions, pamphlets, or other written materials from your doctor or pharmacy?: 1 - Never What is the last grade level you completed in school?: College  Diabetic? no  Interpreter Needed?: No  Information entered by ::  N. , LPN   Activities of Daily Living In your present state of health, do you have any difficulty performing the following activities: 01/09/2020  Hearing? N  Vision? N  Difficulty concentrating or making decisions? N  Walking or climbing stairs? N  Dressing or bathing? N  Doing errands, shopping? N  Preparing Food and eating ? N  Using the Toilet? N  In the past six months, have you accidently leaked urine? N  Do you have problems with loss of bowel control? N  Managing your Medications? N  Managing your Finances? N  Housekeeping or managing your Housekeeping? N  Some recent data might be hidden    Patient Care Team: Janith Lima, MD as PCP - General (Internal Medicine) Pieter Partridge, DO as Consulting Physician (Neurology) Exie Parody, OD as Physician Assistant (Optometry) Fontaine, Belinda Block, MD (Inactive) as Consulting Physician (Gynecology) Sueanne Margarita, MD as Consulting Physician (Cardiology)  Indicate any recent Medical Services you may have received from other than [facility] providers in the past year (date may be approximate).       Assessment:   This is a routine wellness examination for [place].  Hearing/Vision screen No exam data present  Dietary issues and exercise activities discussed: Current Exercise Habits: The patient does not participate in regular exercise at present, Exercise limited by: None identified  Goals    . Enjoy life, be healthy, and stay on top of my health.     Be there for my family and travel    . Patient Stated     [name] and love my family and grandchildren.       Depression Screen PHQ 2/9 Scores 01/09/2020 10/18/2019 01/05/2019 11/25/2017 06/18/2017 11/18/2016  PHQ - 2 Score _0 0 2  PHQ- 9 Score _1 - 4    Fall Risk Fall Risk  01/09/2020 10/18/2019 04/18/2019 01/05/2019 10/15/2018  Falls in the past year? 1 0 1 0 1  Number falls in past yr: 1 0 1 0 1  Injury with Fall? 0 0 0 - 1  Risk Factor Category  - - - - -  Risk for fall due to : No Fall Risks No Fall Risks - - -  Follow up Falls evaluation completed Falls evaluation completed - - Falls evaluation completed    Any stairs in or around the home? Yes  If so, are there any without handrails? No  Home free of loose throw rugs in walkways, pet beds, electrical cords, etc? Yes  Adequate lighting in your home to reduce risk of falls? Yes   ASSISTIVE DEVICES UTILIZED TO PREVENT FALLS:  Life alert? No  Use of a cane, walker or w/c? No  Grab bars in the bathroom? Yes  Shower chair or bench in shower? Yes  Elevated toilet seat or a handicapped toilet? No   TIMED UP AND GO:  Was the test performed? No .  Length of time to ambulate 10 feet: 0 sec.   Gait steady and fast without use of assistive device  Cognitive Function: MMSE - Mini Mental State Exam 10/10/2014  Orientation to time 5  Orientation to Place 5  Registration 3  Attention/ Calculation 5  Recall 3  Language- name 2 objects 2  Language- repeat 1  Language- follow 3 step command 3  Language- read & follow direction 1  Write a sentence 1  Copy design 1   Total score 30     6CIT Screen 01/09/2020  What Year? 0 points  What month? 0 points  What time? 0 points  Count back from 20 0 points  Months in reverse 0 points  Repeat phrase 0 points  Total Score 0    Immunizations Immunization History  Administered Date(s) Administered  . Fluad Quad(high Dose 65+) 03/09/2019  . Influenza, High Dose Seasonal PF 02/24/2017, 03/31/2018  . Influenza,inj,Quad PF,6+ Mos 02/21/2016  . Influenza-Unspecified 03/20/2014, 04/03/2015  . MMR 11/27/2017  . PFIZER SARS-COV-2 Vaccination 07/09/2019, 07/30/2019  . Pneumococcal Conjugate-13 11/25/2017  . Pneumococcal Polysaccharide-23 01/17/2019  . Pneumococcal-Unspecified 03/28/2013  . Tdap 09/05/2015  . Zoster 06/23/2012    TDAP status: Up to date Flu Vaccine status: Up to date Pneumococcal vaccine status: Up to date Covid-19 vaccine status: Completed vaccines  Qualifies for Shingles Vaccine? Yes   Zostavax completed Yes   Shingrix Completed?: No.    Education has been provided regarding the importance of this vaccine. Patient has been advised to call insurance company to determine out of pocket expense if they have not yet received this vaccine. Advised may also receive vaccine at local pharmacy or Health Dept. Verbalized acceptance and understanding.  Screening Tests Health Maintenance  Topic Date Due  . COLONOSCOPY  09/10/2018  . FOOT EXAM  11/26/2018  . MAMMOGRAM  03/20/2019  . URINE MICROALBUMIN  01/17/2020  . INFLUENZA VACCINE  01/22/2020  . HEMOGLOBIN A1C  04/18/2020  . OPHTHALMOLOGY EXAM  05/10/2020  . TETANUS/TDAP  09/04/2025  . DEXA SCAN  Completed  . COVID-19 Vaccine  Completed  . Hepatitis C Screening  Completed  . PNA vac Low Risk Adult  Completed    Health Maintenance  Health Maintenance Due  Topic Date Due  . COLONOSCOPY  09/10/2018  . FOOT EXAM  11/26/2018  . MAMMOGRAM  03/20/2019  . URINE MICROALBUMIN  01/17/2020    Colorectal cancer screening: Completed  09/10/2015. Repeat every 3 years Mammogram status: Completed 03/19/2017. Repeat every year Bone Density status: Completed 10/15/2016. Results reflect: Bone density results: OSTEOPOROSIS. Repeat every 2 years.  Lung Cancer Screening: (Low Dose CT Chest recommended if Age 66-80 years, 30 pack-year currently smoking OR have quit w/in 15years.) does not qualify.   Lung Cancer Screening Referral: no  Additional Screening:  Hepatitis C Screening: does qualify; Completed yes  Vision Screening: Recommended annual ophthalmology exams for early detection of glaucoma and other disorders of the eye. Is the patient up to date with their annual eye exam?  Yes  Who is the provider or what is the name of the office in which the patient attends annual eye exams? [facility] in [place] If pt is not established with a provider, would they like to be referred to a provider to establish care? No .   Dental Screening: Recommended annual dental exams for proper oral hygiene  Community Resource Referral / Chronic Care Management: CRR required this visit?  Yes   CCM required this visit?  Yes      Plan:     I have personally reviewed and noted the following in the patient's chart:   . Medical and social history . Use of alcohol, tobacco or illicit drugs  . Current medications and supplements . Functional ability and status .  Nutritional status . Physical activity . Advanced directives . List of other physicians . Hospitalizations, surgeries, and ER visits in previous 12 months . Vitals . Screenings to include cognitive, depression, and falls . Referrals and appointments  In addition, I have reviewed and discussed with patient certain preventive protocols, quality metrics, and best practice recommendations. A written personalized care plan for preventive services as well as general preventive health recommendations were provided to patient.     Sheral Flow, LPN   10/24/5463   Nurse Notes:    Patient is needing referral to [facility] for issues with finances, food, etc. Patient is under a great deal of stress and depression and is seeking treatment.

## 2020-01-09 NOTE — Patient Instructions (Signed)
Major Depressive Disorder, Adult Major depressive disorder (MDD) is a mental health condition. It may also be called clinical depression or unipolar depression. MDD usually causes feelings of sadness, hopelessness, or helplessness. MDD can also cause physical symptoms. It can interfere with work, school, relationships, and other everyday activities. MDD may be mild, moderate, or severe. It may occur once (single episode major depressive disorder) or it may occur multiple times (recurrent major depressive disorder). What are the causes? The exact cause of this condition is not known. MDD is most likely caused by a combination of things, which may include:  Genetic factors. These are traits that are passed along from parent to child.  Individual factors. Your personality, your behavior, and the way you handle your thoughts and feelings may contribute to MDD. This includes personality traits and behaviors learned from others.  Physical factors, such as: ? Differences in the part of your brain that controls emotion. This part of your brain may be different than it is in people who do not have MDD. ? Long-term (chronic) medical or psychiatric illnesses.  Social factors. Traumatic experiences or major life changes may play a role in the development of MDD. What increases the risk? This condition is more likely to develop in women. The following factors may also make you more likely to develop MDD:  A family history of depression.  Troubled family relationships.  Abnormally low levels of certain brain chemicals.  Traumatic events in childhood, especially abuse or the loss of a parent.  Being under a lot of stress, or long-term stress, especially from upsetting life experiences or losses.  A history of: ? Chronic physical illness. ? Other mental health disorders. ? Substance abuse.  Poor living conditions.  Experiencing social exclusion or discrimination on a regular basis. What are the  signs or symptoms? The main symptoms of MDD typically include:  Constant depressed or irritable mood.  Loss of interest in things and activities. MDD symptoms may also include:  Sleeping or eating too much or too little.  Unexplained weight change.  Fatigue or low energy.  Feelings of worthlessness or guilt.  Difficulty thinking clearly or making decisions.  Thoughts of suicide or of harming others.  Physical agitation or weakness.  Isolation. Severe cases of MDD may also occur with other symptoms, such as:  Delusions or hallucinations, in which you imagine things that are not real (psychotic depression).  Low-level depression that lasts at least a year (chronic depression or persistent depressive disorder).  Extreme sadness and hopelessness (melancholic depression).  Trouble speaking and moving (catatonic depression). How is this diagnosed? This condition may be diagnosed based on:  Your symptoms.  Your medical history, including your mental health history. This may involve tests to evaluate your mental health. You may be asked questions about your lifestyle, including any drug and alcohol use, and how long you have had symptoms of MDD.  A physical exam.  Blood tests to rule out other conditions. You must have a depressed mood and at least four other MDD symptoms most of the day, nearly every day in the same 2-week timeframe before your health care provider can confirm a diagnosis of MDD. How is this treated? This condition is usually treated by mental health professionals, such as psychologists, psychiatrists, and clinical social workers. You may need more than one type of treatment. Treatment may include:  Psychotherapy. This is also called talk therapy or counseling. Types of psychotherapy include: ? Cognitive behavioral therapy (CBT). This type of therapy   teaches you to recognize unhealthy feelings, thoughts, and behaviors, and replace them with positive thoughts  and actions. ? Interpersonal therapy (IPT). This helps you to improve the way you relate to and communicate with others. ? Family therapy. This treatment includes members of your family.  Medicine to treat anxiety and depression, or to help you control certain emotions and behaviors.  Lifestyle changes, such as: ? Limiting alcohol and drug use. ? Exercising regularly. ? Getting plenty of sleep. ? Making healthy eating choices. ? Spending more time outdoors.  Treatments involving stimulation of the brain can be used in situations with extremely severe symptoms, or when medicine or other therapies do not work over time. These treatments include electroconvulsive therapy, transcranial magnetic stimulation, and vagal nerve stimulation. Follow these instructions at home: Activity  Return to your normal activities as told by your health care provider.  Exercise regularly and spend time outdoors as told by your health care provider. General instructions  Take over-the-counter and prescription medicines only as told by your health care provider.  Do not drink alcohol. If you drink alcohol, limit your alcohol intake to no more than 1 drink a day for nonpregnant women and 2 drinks a day for men. One drink equals 12 oz of beer, 5 oz of wine, or 1 oz of hard liquor. Alcohol can affect any antidepressant medicines you are taking. Talk to your health care provider about your alcohol use.  Eat a healthy diet and get plenty of sleep.  Find activities that you enjoy doing, and make time to do them.  Consider joining a support group. Your health care provider may be able to recommend a support group.  Keep all follow-up visits as told by your health care provider. This is important. Where to find more information National Alliance on Mental Illness  www.nami.org [place]. National Institute of Mental Health  www.nimh.nih.gov National Suicide Prevention Lifeline  1-800-273-TALK (8255). This is  free, 24-hour help. Contact a health care provider if:  Your symptoms get worse.  You develop new symptoms. Get help right away if:  You self-harm.  You have serious thoughts about hurting yourself or others.  You see, hear, taste, smell, or feel things that are not present (hallucinate). This information is not intended to replace advice given to you by your health care provider. Make sure you discuss any questions you have with your health care provider. Document Revised: 05/22/2017 Document Reviewed: 12/19/2015 Elsevier Patient Education  2020 Elsevier Inc.  

## 2020-01-10 ENCOUNTER — Telehealth: Payer: Self-pay

## 2020-01-10 MED ORDER — BUPROPION HCL ER (XL) 300 MG PO TB24: 300.0000 mg | ORAL_TABLET | Freq: Every day | ORAL | 0 refills | Status: DC

## 2020-01-10 NOTE — Telephone Encounter (Signed)
01/10/20 Spoke with patient about meals on wheels, signed patient up for this service and gave information for the [employer] which offers food, transportation and other resources for seniors. Will follow-up with patient later this week. Ambrose Mantle [phone]

## 2020-01-11 ENCOUNTER — Ambulatory Visit (INDEPENDENT_AMBULATORY_CARE_PROVIDER_SITE_OTHER): Payer: PPO | Admitting: Psychology

## 2020-01-11 DIAGNOSIS — F331 Major depressive disorder, recurrent, moderate: Secondary | ICD-10-CM

## 2020-01-11 DIAGNOSIS — F4310 Post-traumatic stress disorder, unspecified: Secondary | ICD-10-CM | POA: Diagnosis not present

## 2020-01-11 DIAGNOSIS — F411 Generalized anxiety disorder: Secondary | ICD-10-CM

## 2020-01-13 NOTE — Telephone Encounter (Signed)
01/13/20 Spoke with patient she received a call from [employer] she will need to sign application and she has contacted the [employer].  No other resources are needed at this time.  Closing referral. Ambrose Mantle [phone]

## 2020-01-17 ENCOUNTER — Ambulatory Visit (INDEPENDENT_AMBULATORY_CARE_PROVIDER_SITE_OTHER): Payer: PPO | Admitting: Psychology

## 2020-01-17 ENCOUNTER — Ambulatory Visit: Payer: PPO | Admitting: Neurology

## 2020-01-17 DIAGNOSIS — F331 Major depressive disorder, recurrent, moderate: Secondary | ICD-10-CM | POA: Diagnosis not present

## 2020-01-17 DIAGNOSIS — F4323 Adjustment disorder with mixed anxiety and depressed mood: Secondary | ICD-10-CM

## 2020-01-18 ENCOUNTER — Telehealth: Payer: Self-pay | Admitting: Internal Medicine

## 2020-01-18 NOTE — Progress Notes (Signed)
  Chronic Care Management   Outreach Note  01/18/2020 Name: DIANELY KREHBIEL MRN: [id number] DOB: 1951-01-07  Referred by: Janith Lima, MD Reason for referral : No chief complaint on file.   An unsuccessful telephone outreach was attempted today. The patient was referred to the pharmacist for assistance with care management and care coordination.   Follow Up Plan:   Earney [place] Upstream Scheduler

## 2020-01-20 ENCOUNTER — Telehealth: Payer: Self-pay | Admitting: Internal Medicine

## 2020-01-20 NOTE — Progress Notes (Signed)
  Chronic Care Management   Outreach Note  01/20/2020 Name: Brandi Ramsey MRN: [id number] DOB: 1950/09/17  Referred by: Janith Lima, MD Reason for referral : No chief complaint on file.   An unsuccessful telephone outreach was attempted today. The patient was referred to the pharmacist for assistance with care management and care coordination.   Follow Up Plan:   Earney Hamburg Upstream Scheduler

## 2020-01-27 ENCOUNTER — Telehealth: Payer: Self-pay | Admitting: Internal Medicine

## 2020-01-27 NOTE — Progress Notes (Signed)
  Chronic Care Management   Outreach Note  01/27/2020 Name: Brandi Ramsey MRN: [id number] DOB: September 21, 1950  Referred by: Janith Lima, MD Reason for referral : No chief complaint on file.   An unsuccessful telephone outreach was attempted today. The patient was referred to the pharmacist for assistance with care management and care coordination.   Follow Up Plan:   Earney Hamburg Upstream Scheduler

## 2020-01-31 ENCOUNTER — Encounter: Payer: Self-pay | Admitting: Internal Medicine

## 2020-02-06 ENCOUNTER — Ambulatory Visit (INDEPENDENT_AMBULATORY_CARE_PROVIDER_SITE_OTHER): Payer: PPO | Admitting: Psychology

## 2020-02-06 DIAGNOSIS — F4310 Post-traumatic stress disorder, unspecified: Secondary | ICD-10-CM

## 2020-02-06 DIAGNOSIS — F331 Major depressive disorder, recurrent, moderate: Secondary | ICD-10-CM

## 2020-02-06 DIAGNOSIS — F4323 Adjustment disorder with mixed anxiety and depressed mood: Secondary | ICD-10-CM | POA: Diagnosis not present

## 2020-02-16 DIAGNOSIS — M7531 Calcific tendinitis of right shoulder: Secondary | ICD-10-CM | POA: Diagnosis not present

## 2020-02-16 DIAGNOSIS — M25511 Pain in right shoulder: Secondary | ICD-10-CM | POA: Diagnosis not present

## 2020-03-05 ENCOUNTER — Ambulatory Visit (INDEPENDENT_AMBULATORY_CARE_PROVIDER_SITE_OTHER): Payer: PPO | Admitting: Psychology

## 2020-03-05 DIAGNOSIS — F331 Major depressive disorder, recurrent, moderate: Secondary | ICD-10-CM

## 2020-03-05 DIAGNOSIS — F4323 Adjustment disorder with mixed anxiety and depressed mood: Secondary | ICD-10-CM

## 2020-03-05 DIAGNOSIS — F4310 Post-traumatic stress disorder, unspecified: Secondary | ICD-10-CM | POA: Diagnosis not present

## 2020-03-09 DIAGNOSIS — M7581 Other shoulder lesions, right shoulder: Secondary | ICD-10-CM | POA: Diagnosis not present

## 2020-03-13 NOTE — Progress Notes (Deleted)
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  UPDATE: Current medications:  Phenobarbital 97.5mg  at bedtime (seizure prophylaxis); Calcium 500mg  twice daily and vitamin D 600 IU twice daily; Cymbalta 60mg  daily (headache prophylaxis, increased by Dr. Ronnald Ramp to address depression)  ***  HISTORY: Her son has fragile X syndrome with severe autism.About 22 years ago, she began experiencing dizzy spells.She apparently had imaging of the head which was suspicious for possible multiple sclerosis.She had lumbar puncture, which was negative.About 10 years ago, she developed mild tremor and unsteadiness.She was worked up by Dr. Hazle Nordmann, a fragile X specialist at [facility], who diagnosed her with fragile X-associated tremor ataxia syndrome.Work up included genetic testing for FMR1 gene premutation.   She has had a progressive course over the years.She will have dizzy spells, described as lightheadedness, which will cause falls.It lasts a few seconds.  She has never actually lost consciousness.  She has fractured her wrists and leg.She also has tremor of the hands, causing difficulty with writing.She also developed dysphonia and was found to have tremor involving her "voice box".She reports prior episodes of both bowel and bladder incontinence, which are rare.She also reports poor depth perception.For example, she may sometimes drive too close to the curb or she may accidentally bump into somebody when walking through the grocery store aisle.She reports increased fatigue and weight gain.She has depression and mood swings.She also reports short-term memory problems.She also has  problems with swallowing and has choked on some occasions, requiring the Heimlich maneuver.  She has a remote history of epilepsy.She had convulsions as a baby and two generalized tonic-clonic seizures in her early twenties.She has been on phenobarbital ever since.No recurrent seizures.A DEXA scan was performed in 2016 and was negative.She continues on phenobarbital 97.5mg  at bedtime.  She also has history of migraine, described as severe pressure and throbbing, over her left eye.In March 2015, she had such a headache associated with right sided facial numbness and lower facial weakness.She was admitted to [facility] for TIA.CT of the head was unremarkable.MRI of the brain showed periventricular and subcortical hyperintensities.MRA of the head showed no major intracranial arterial stenosis, however there is question of mild narrowing of the left cavernous ICA.Carotid duplex showed 1-39% bilateral ICA stenosis.2D echo showed LVEF of 60-65% with moderate LVH.  She suffered postconcussion syndrome since slipping and hitting the back of her head in August 2016.She says she passed afterwards for a few seconds.Afterwards, she noted headache, spinning sensation, nausea and she vomited.She went to the ED where CT of the head and cervical spine showed no acute findings.She was discharged home with meclizine and hydrocodone.Since she has been home, she still notes constant dull posterior headache.She takes ibuprofen for it daily but not the hydrocodone.The dizziness is improving.She has been more emotional and was crying easily.She is seeing Dr. Hulan Saas of Sports Medicine for management.  Due to concerns of possible prior history of MS, she underwent MRI of the brain and cervical spine with and without contrast on 03/15/15. There was slight progression of nonspecific cerebral white matter changes, most probably chronic small vessel disease, compared to 2012.  Cervical spine showed no cord lesions.   Past medications: venlafaxine  PAST MEDICAL HISTORY: Past Medical History:  Diagnosis  Date  . Anxiety   . Arthritis   . Cerebellar ataxia ([place])   . Colon polyps   . Depression   . Diabetes mellitus without complication ([place])   . Fibrocystic breast   . Fibromyalgia   . 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 Gastric ulcer   . Hemorrhoids   . History of Doppler ultrasound    a. Carotid US 3/15: mild soft plaque origin ICA. 1-39% ICA stenosis.  [name] History of echocardiogram    a. Echo 3/15: Moderate LVH, focal basal hypertrophy, EF 60-65%, normal wall motion  . History of nuclear stress test    a. Myoview 5/16: Normal perfusion, EF 78%, low risk  . Hyperlipidemia   . Hypertension   . Obesity   . Orthostatic hypotension 01/20/2019  . Pneumonia   . Seizures ([place])   . Stroke ([place])   . Transient cerebral ischemia   . Vertigo, benign paroxysmal     MEDICATIONS: Current Outpatient Medications on File Prior to Visit  Medication Sig Dispense Refill  . acetaminophen (TYLENOL) 325 MG tablet Take 650 mg by mouth every 6 (six) hours as needed for mild pain or fever.    [name] atorvastatin (LIPITOR) 80 MG tablet TAKE 1 TABLET BY MOUTH EVERY DAY AT 6PM 90 tablet 1  . brexpiprazole (REXULTI) 2 MG TABS tablet Take 1 tablet (2 mg total) by mouth daily. 90 tablet 1  . buPROPion (WELLBUTRIN XL) 300 MG 24 hr tablet Take 1 tablet (300 mg total) by mouth daily. 90 tablet 0  . clonazePAM (KLONOPIN) 1 MG tablet TAKE 1 TABLET (1 MG TOTAL) BY MOUTH 2 (TWO) TIMES DAILY AS NEEDED FOR ANXIETY. 60 tablet 5  . DULoxetine (CYMBALTA) 60 MG capsule TAKE 1 CAPSULE BY MOUTH EVERY DAY 90 capsule 1  . ibuprofen (ADVIL) 200 MG tablet Take 200 mg by mouth every 6 (six) hours as needed for fever or mild pain.     . meclizine (ANTIVERT) 12.5 MG tablet TAKE 1 TABLET BY MOUTH 3 (THREE) TIMES DAILY AS NEEDED FOR DIZZINESS. 45 tablet 1  . Multiple Vitamin  (MULTIVITAMIN) tablet Take 1 tablet by mouth daily.    . pantoprazole (PROTONIX) 40 MG tablet TAKE 1 TABLET BY MOUTH TWICE A DAY 180 tablet 1  . PHENobarbital (LUMINAL) 97.2 MG tablet TAKE 1 TABLET BY MOUTH EVERYDAY AT BEDTIME 30 tablet 5  . [DISCONTINUED] atorvastatin (LIPITOR) 80 MG tablet TAKE 1 TABLET BY MOUTH EVERY DAY AT 6PM 90 tablet 1   No current facility-administered medications on file prior to visit.    ALLERGIES: Allergies  Allergen Reactions  . Buprenorphine Hcl Hives and Itching  . Hydromorphone     Could not focus, "Seeing scary things."  Noises louder. Altered mental status  . Meperidine Hives and Itching    Other reaction(s): HIVES,ITCHING  . Metformin And Related Diarrhea  . Morphine And Related Hives and Itching    States only when mixed with Demerol  . Pentazocine Anaphylaxis  . Pentazocine Lactate Anaphylaxis  . Phenytoin Hives    Other reaction(s): RASH  . Demerol Hives and Itching  . Dilaudid [Hydromorphone Hcl]     Hallucinations and felt crazy   . Doxycycline Hyclate     Other reaction(s): GI UPSET,NAUSEA,VOMITING  . Ibuprofen     Contraindicated for risk of GI bleeding.  [name] Phenytoin Sodium Extended Hives  . Codeine Rash and Nausea Only    When mixed with Demerol Patient reports no reaction with  Codeine/APAP (Tylenol #3)  . Doxycycline Hyclate Nausea And Vomiting    Other reaction(s): GI UPSET,NAUSEA,VOMITING Other reaction(s): GI UPSET,NAUSEA,VOMITING    FAMILY HISTORY: Family History  Problem Relation Age of Onset  . Raynaud syndrome Mother   . Colon polyps Mother   . Heart attack Mother   . Alzheimer's disease Father   . Diabetes Father   . Colon polyps Father   . Fragile X syndrome Son        Patient is carrier  . Diabetes Sister   . Diabetes Brother   . Cervical cancer Maternal Grandmother   . Breast cancer Maternal Grandmother   . Ovarian cancer Maternal Grandmother   . Heart failure Maternal Grandfather   . Diabetes Paternal  Grandmother   . Colon polyps Sister   . Colon polyps Brother   . Colon cancer Maternal Aunt     SOCIAL HISTORY: Social History   Socioeconomic History  . Marital status: Married    Spouse name: Vira Agar  . Number of children: 2  . Years of education: Not on file  . Highest education level: Some college, no degree  Occupational History  . Occupation: retired  Tobacco Use  . Smoking status: Never Smoker  . Smokeless tobacco: Never Used  Vaping Use  . Vaping Use: Never used  Substance and Sexual Activity  . Alcohol use: No    Alcohol/week: 0.0 standard drinks  . Drug use: No  . Sexual activity: Yes    Partners: Male    Comment: 1st intercourse 69 yo-Fewer than 5 partners  Other Topics Concern  . 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 Difficulty of Paying Living Expenses: Very hard  Food Insecurity: Food Insecurity Present  . Worried About Charity fundraiser in the Last Year: Often true  . Ran Out of Food in the Last Year: Often true  Transportation Needs: No Transportation Needs  . Lack of Transportation (Medical): No  . Lack of Transportation (Non-Medical): No  Physical Activity: Sufficiently Active  . Days of Exercise per Week: 7 days  . Minutes of Exercise per Session: 60 min  Stress: Stress Concern Present  . Feeling of Stress : Very much  Social Connections:   . Frequency of Communication with Friends and Family: Not on file  . Frequency of Social Gatherings with Friends and Family: Not on file  . Attends Religious Services: Not on file  . Active Member of Clubs or Organizations: Not on file  . Attends Archivist Meetings: Not on file  . Marital Status: Not on file  Intimate Partner Violence:   . Fear of Current or Ex-Partner: Not on file   . Emotionally Abused: Not on file  . Physically Abused: Not on file  . Sexually Abused: Not on file   PHYSICAL EXAM: *** General: No acute distress.  Patient appears well-groomed.   Head:  Normocephalic/atraumatic Eyes:  Fundi examined but not visualized Neck: supple, no paraspinal tenderness, full range of motion Heart:  Regular rate and rhythm Lungs:  Clear to auscultation bilaterally Back: No paraspinal tenderness Neurological Exam: alert and oriented to person, place, and time. Attention span and concentration intact, recent and remote memory intact, fund of knowledge intact.  Speech  fluent and not dysarthric, language intact.  CN II-XII intact. Bulk and tone normal, muscle strength 5/5 throughout.  Sensation to light touch, temperature and vibration intact.  Deep tendon reflexes 2+ throughout, toes downgoing.  Finger to nose and heel to shin testing intact.  Gait normal, Romberg negative.  IMPRESSION: 1.  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  Depression and anxiety 3.  Tension-type headache, not intractable 4.  Migraine with aura, without status migrainosus, not intractable 5.  History of generalized epilepsy  PLAN: 1.  Phenobarbital 97.5mg  at bedtime of seizure prophylaxis 2.  Cymbalta 60mg  daily for headache prophylaxis 3.  Ca 500mg  twice daily and vit D 600 IU twice daily 4.  Limit use of pain relievers to no more than 2 days out of week to prevent risk of rebound or medication-overuse headache. 5.  Follow up ***  Metta Clines, DO  CC: Scarlette Calico, MD

## 2020-03-14 DIAGNOSIS — M25511 Pain in right shoulder: Secondary | ICD-10-CM | POA: Diagnosis not present

## 2020-03-14 DIAGNOSIS — M75101 Unspecified rotator cuff tear or rupture of right shoulder, not specified as traumatic: Secondary | ICD-10-CM | POA: Diagnosis not present

## 2020-03-15 ENCOUNTER — Ambulatory Visit: Payer: PPO | Admitting: Neurology

## 2020-03-15 ENCOUNTER — Encounter: Payer: Self-pay | Admitting: Internal Medicine

## 2020-03-27 ENCOUNTER — Ambulatory Visit (INDEPENDENT_AMBULATORY_CARE_PROVIDER_SITE_OTHER): Payer: PPO | Admitting: Psychology

## 2020-03-27 DIAGNOSIS — F4323 Adjustment disorder with mixed anxiety and depressed mood: Secondary | ICD-10-CM | POA: Diagnosis not present

## 2020-03-27 DIAGNOSIS — M75101 Unspecified rotator cuff tear or rupture of right shoulder, not specified as traumatic: Secondary | ICD-10-CM | POA: Diagnosis not present

## 2020-03-27 DIAGNOSIS — F331 Major depressive disorder, recurrent, moderate: Secondary | ICD-10-CM

## 2020-03-27 DIAGNOSIS — F4310 Post-traumatic stress disorder, unspecified: Secondary | ICD-10-CM

## 2020-03-30 ENCOUNTER — Other Ambulatory Visit: Payer: Self-pay | Admitting: Internal Medicine

## 2020-03-30 DIAGNOSIS — F332 Major depressive disorder, recurrent severe without psychotic features: Secondary | ICD-10-CM

## 2020-03-30 DIAGNOSIS — F331 Major depressive disorder, recurrent, moderate: Secondary | ICD-10-CM

## 2020-04-11 ENCOUNTER — Ambulatory Visit: Payer: PPO | Admitting: Psychology

## 2020-04-13 DIAGNOSIS — Z01812 Encounter for preprocedural laboratory examination: Secondary | ICD-10-CM | POA: Diagnosis not present

## 2020-04-13 DIAGNOSIS — Z01810 Encounter for preprocedural cardiovascular examination: Secondary | ICD-10-CM | POA: Diagnosis not present

## 2020-04-13 DIAGNOSIS — M75101 Unspecified rotator cuff tear or rupture of right shoulder, not specified as traumatic: Secondary | ICD-10-CM | POA: Diagnosis not present

## 2020-04-16 DIAGNOSIS — G8918 Other acute postprocedural pain: Secondary | ICD-10-CM | POA: Diagnosis not present

## 2020-04-16 DIAGNOSIS — M75111 Incomplete rotator cuff tear or rupture of right shoulder, not specified as traumatic: Secondary | ICD-10-CM | POA: Diagnosis not present

## 2020-04-16 DIAGNOSIS — S46211A Strain of muscle, fascia and tendon of other parts of biceps, right arm, initial encounter: Secondary | ICD-10-CM | POA: Diagnosis not present

## 2020-04-16 DIAGNOSIS — S46219A Strain of muscle, fascia and tendon of other parts of biceps, unspecified arm, initial encounter: Secondary | ICD-10-CM | POA: Diagnosis not present

## 2020-04-16 DIAGNOSIS — M75101 Unspecified rotator cuff tear or rupture of right shoulder, not specified as traumatic: Secondary | ICD-10-CM | POA: Diagnosis not present

## 2020-04-20 DIAGNOSIS — Z20822 Contact with and (suspected) exposure to covid-19: Secondary | ICD-10-CM | POA: Diagnosis not present

## 2020-04-24 ENCOUNTER — Ambulatory Visit (INDEPENDENT_AMBULATORY_CARE_PROVIDER_SITE_OTHER): Payer: PPO | Admitting: Psychology

## 2020-04-24 ENCOUNTER — Other Ambulatory Visit: Payer: Self-pay | Admitting: Internal Medicine

## 2020-04-24 DIAGNOSIS — F4310 Post-traumatic stress disorder, unspecified: Secondary | ICD-10-CM | POA: Diagnosis not present

## 2020-04-24 DIAGNOSIS — F331 Major depressive disorder, recurrent, moderate: Secondary | ICD-10-CM | POA: Diagnosis not present

## 2020-04-24 DIAGNOSIS — F4323 Adjustment disorder with mixed anxiety and depressed mood: Secondary | ICD-10-CM

## 2020-04-26 ENCOUNTER — Other Ambulatory Visit: Payer: Self-pay | Admitting: Internal Medicine

## 2020-04-26 DIAGNOSIS — G40309 Generalized idiopathic epilepsy and epileptic syndromes, not intractable, without status epilepticus: Secondary | ICD-10-CM

## 2020-04-30 DIAGNOSIS — Z4789 Encounter for other orthopedic aftercare: Secondary | ICD-10-CM | POA: Diagnosis not present

## 2020-04-30 DIAGNOSIS — S46219D Strain of muscle, fascia and tendon of other parts of biceps, unspecified arm, subsequent encounter: Secondary | ICD-10-CM | POA: Diagnosis not present

## 2020-05-08 ENCOUNTER — Ambulatory Visit (INDEPENDENT_AMBULATORY_CARE_PROVIDER_SITE_OTHER): Payer: PPO | Admitting: Psychology

## 2020-05-08 DIAGNOSIS — F331 Major depressive disorder, recurrent, moderate: Secondary | ICD-10-CM | POA: Diagnosis not present

## 2020-05-08 DIAGNOSIS — F4310 Post-traumatic stress disorder, unspecified: Secondary | ICD-10-CM | POA: Diagnosis not present

## 2020-05-08 DIAGNOSIS — S46219D Strain of muscle, fascia and tendon of other parts of biceps, unspecified arm, subsequent encounter: Secondary | ICD-10-CM | POA: Diagnosis not present

## 2020-05-08 DIAGNOSIS — F4323 Adjustment disorder with mixed anxiety and depressed mood: Secondary | ICD-10-CM | POA: Diagnosis not present

## 2020-05-08 DIAGNOSIS — Z4789 Encounter for other orthopedic aftercare: Secondary | ICD-10-CM | POA: Diagnosis not present

## 2020-05-22 ENCOUNTER — Ambulatory Visit: Payer: PPO | Admitting: Psychology

## 2020-06-05 ENCOUNTER — Ambulatory Visit (INDEPENDENT_AMBULATORY_CARE_PROVIDER_SITE_OTHER): Payer: PPO | Admitting: Psychology

## 2020-06-05 DIAGNOSIS — F331 Major depressive disorder, recurrent, moderate: Secondary | ICD-10-CM

## 2020-06-05 DIAGNOSIS — F4310 Post-traumatic stress disorder, unspecified: Secondary | ICD-10-CM | POA: Diagnosis not present

## 2020-06-05 DIAGNOSIS — F4323 Adjustment disorder with mixed anxiety and depressed mood: Secondary | ICD-10-CM

## 2020-06-13 ENCOUNTER — Ambulatory Visit: Payer: PPO | Admitting: Psychology

## 2020-07-06 ENCOUNTER — Telehealth: Payer: Self-pay | Admitting: Internal Medicine

## 2020-07-06 ENCOUNTER — Encounter: Payer: Self-pay | Admitting: Internal Medicine

## 2020-07-06 NOTE — Telephone Encounter (Signed)
Patient called and said she has a spot on her neck. She said it has been there for a few weeks and it itches sometimes. She was wondering if she could send pictures through her Mychart to see if something could be prescribed.

## 2020-07-07 NOTE — Telephone Encounter (Signed)
I need to see this in person

## 2020-07-13 ENCOUNTER — Ambulatory Visit: Payer: PPO | Admitting: Internal Medicine

## 2020-07-25 ENCOUNTER — Other Ambulatory Visit: Payer: Self-pay | Admitting: Internal Medicine

## 2020-07-25 DIAGNOSIS — F411 Generalized anxiety disorder: Secondary | ICD-10-CM

## 2020-07-25 DIAGNOSIS — G40309 Generalized idiopathic epilepsy and epileptic syndromes, not intractable, without status epilepticus: Secondary | ICD-10-CM

## 2020-07-26 ENCOUNTER — Telehealth: Payer: Self-pay | Admitting: Internal Medicine

## 2020-07-26 ENCOUNTER — Other Ambulatory Visit: Payer: Self-pay | Admitting: Internal Medicine

## 2020-07-26 DIAGNOSIS — G40309 Generalized idiopathic epilepsy and epileptic syndromes, not intractable, without status epilepticus: Secondary | ICD-10-CM

## 2020-07-26 DIAGNOSIS — F332 Major depressive disorder, recurrent severe without psychotic features: Secondary | ICD-10-CM

## 2020-07-26 DIAGNOSIS — F331 Major depressive disorder, recurrent, moderate: Secondary | ICD-10-CM

## 2020-07-26 DIAGNOSIS — F411 Generalized anxiety disorder: Secondary | ICD-10-CM

## 2020-07-26 MED ORDER — PHENOBARBITAL 97.2 MG PO TABS
97.2000 mg | ORAL_TABLET | Freq: Every day | ORAL | 0 refills | Status: DC
Start: 2020-07-26 — End: 2020-08-28

## 2020-07-26 NOTE — Telephone Encounter (Signed)
PHENobarbital (LUMINAL) 97.2 MG tablet Patient wondering if she can get a short supply of this medication until she can be seen on 02.23.22

## 2020-07-29 ENCOUNTER — Other Ambulatory Visit: Payer: Self-pay | Admitting: Internal Medicine

## 2020-07-29 DIAGNOSIS — F411 Generalized anxiety disorder: Secondary | ICD-10-CM

## 2020-07-31 ENCOUNTER — Encounter: Payer: Self-pay | Admitting: Internal Medicine

## 2020-07-31 ENCOUNTER — Other Ambulatory Visit: Payer: Self-pay | Admitting: Internal Medicine

## 2020-07-31 DIAGNOSIS — E785 Hyperlipidemia, unspecified: Secondary | ICD-10-CM

## 2020-07-31 MED ORDER — ATORVASTATIN CALCIUM 80 MG PO TABS: 80.0000 mg | ORAL_TABLET | Freq: Every day | ORAL | 1 refills | Status: DC

## 2020-08-15 ENCOUNTER — Ambulatory Visit: Payer: PPO | Admitting: Internal Medicine

## 2020-08-20 ENCOUNTER — Encounter: Payer: Self-pay | Admitting: Internal Medicine

## 2020-08-20 ENCOUNTER — Other Ambulatory Visit: Payer: Self-pay

## 2020-08-20 ENCOUNTER — Ambulatory Visit (INDEPENDENT_AMBULATORY_CARE_PROVIDER_SITE_OTHER): Payer: PPO | Admitting: Internal Medicine

## 2020-08-20 VITALS — BP 116/70 | HR 88 | Temp 98.5°F | Resp 16 | Ht 64.0 in | Wt 156.0 lb

## 2020-08-20 DIAGNOSIS — E785 Hyperlipidemia, unspecified: Secondary | ICD-10-CM | POA: Diagnosis not present

## 2020-08-20 DIAGNOSIS — F331 Major depressive disorder, recurrent, moderate: Secondary | ICD-10-CM | POA: Diagnosis not present

## 2020-08-20 DIAGNOSIS — I459 Conduction disorder, unspecified: Secondary | ICD-10-CM | POA: Diagnosis not present

## 2020-08-20 DIAGNOSIS — F411 Generalized anxiety disorder: Secondary | ICD-10-CM | POA: Diagnosis not present

## 2020-08-20 DIAGNOSIS — Z1231 Encounter for screening mammogram for malignant neoplasm of breast: Secondary | ICD-10-CM

## 2020-08-20 DIAGNOSIS — F332 Major depressive disorder, recurrent severe without psychotic features: Secondary | ICD-10-CM

## 2020-08-20 DIAGNOSIS — K2100 Gastro-esophageal reflux disease with esophagitis, without bleeding: Secondary | ICD-10-CM

## 2020-08-20 DIAGNOSIS — R42 Dizziness and giddiness: Secondary | ICD-10-CM

## 2020-08-20 DIAGNOSIS — R739 Hyperglycemia, unspecified: Secondary | ICD-10-CM | POA: Diagnosis not present

## 2020-08-20 DIAGNOSIS — M5432 Sciatica, left side: Secondary | ICD-10-CM

## 2020-08-20 DIAGNOSIS — I10 Essential (primary) hypertension: Secondary | ICD-10-CM

## 2020-08-20 DIAGNOSIS — K635 Polyp of colon: Secondary | ICD-10-CM | POA: Insufficient documentation

## 2020-08-20 LAB — HEPATIC FUNCTION PANEL
ALT: 19 U/L (ref 0–35)
AST: 22 U/L (ref 0–37)
Albumin: 4.1 g/dL (ref 3.5–5.2)
Alkaline Phosphatase: 73 U/L (ref 39–117)
Bilirubin, Direct: 0.1 mg/dL (ref 0.0–0.3)
Total Bilirubin: 0.3 mg/dL (ref 0.2–1.2)
Total Protein: 7.1 g/dL (ref 6.0–8.3)

## 2020-08-20 LAB — TSH: TSH: 0.76 u[IU]/mL (ref 0.35–4.50)

## 2020-08-20 LAB — BASIC METABOLIC PANEL
BUN: 20 mg/dL (ref 6–23)
CO2: 28 mEq/L (ref 19–32)
Calcium: 9.6 mg/dL (ref 8.4–10.5)
Chloride: 101 mEq/L (ref 96–112)
Creatinine, Ser: 0.86 mg/dL (ref 0.40–1.20)
GFR: 69.33 mL/min (ref >60.00)
Glucose, Bld: 94 mg/dL (ref 70–99)
Potassium: 4.6 mEq/L (ref 3.5–5.1)
Sodium: 136 mEq/L (ref 135–145)

## 2020-08-20 LAB — LIPID PANEL
Cholesterol: 178 mg/dL (ref 0–200)
HDL: 66.1 mg/dL (ref >39.00)
LDL Cholesterol: 93 mg/dL (ref 0–99)
NonHDL: 111.55
Total CHOL/HDL Ratio: 3
Triglycerides: 93 mg/dL (ref 0.0–149.0)
VLDL: 18.6 mg/dL (ref 0.0–40.0)

## 2020-08-20 LAB — CBC WITH DIFFERENTIAL/PLATELET
Basophils Absolute: 0 10*3/uL (ref 0.0–0.1)
Basophils Relative: 0.6 % (ref 0.0–3.0)
Eosinophils Absolute: 0.1 10*3/uL (ref 0.0–0.7)
Eosinophils Relative: 1 % (ref 0.0–5.0)
HCT: 40.6 % (ref 36.0–46.0)
Hemoglobin: 13.8 g/dL (ref 12.0–15.0)
Lymphocytes Relative: 20.2 % (ref 12.0–46.0)
Lymphs Abs: 1.5 10*3/uL (ref 0.7–4.0)
MCHC: 33.9 g/dL (ref 30.0–36.0)
MCV: 85 fl (ref 78.0–100.0)
Monocytes Absolute: 0.6 10*3/uL (ref 0.1–1.0)
Monocytes Relative: 7.9 % (ref 3.0–12.0)
Neutro Abs: 5.3 10*3/uL (ref 1.4–7.7)
Neutrophils Relative %: 70.3 % (ref 43.0–77.0)
Platelets: 245 10*3/uL (ref 150.0–400.0)
RBC: 4.78 Mil/uL (ref 3.87–5.11)
RDW: 13.5 % (ref 11.5–15.5)
WBC: 7.5 10*3/uL (ref 4.0–10.5)

## 2020-08-20 LAB — HEMOGLOBIN A1C: Hgb A1c MFr Bld: 5.9 % (ref 4.6–6.5)

## 2020-08-20 MED ORDER — BREXPIPRAZOLE 2 MG PO TABS: 2.0000 mg | ORAL_TABLET | Freq: Every day | ORAL | 1 refills | Status: DC

## 2020-08-20 MED ORDER — ATORVASTATIN CALCIUM 80 MG PO TABS: 80.0000 mg | ORAL_TABLET | Freq: Every day | ORAL | 1 refills | Status: DC

## 2020-08-20 MED ORDER — BUPROPION HCL ER (XL) 300 MG PO TB24: 300.0000 mg | ORAL_TABLET | Freq: Every day | ORAL | 1 refills | Status: DC

## 2020-08-20 MED ORDER — CLONAZEPAM 1 MG PO TABS: 1.0000 mg | ORAL_TABLET | Freq: Two times a day (BID) | ORAL | 0 refills | Status: DC | PRN

## 2020-08-20 MED ORDER — DULOXETINE HCL 60 MG PO CPEP: 60.0000 mg | ORAL_CAPSULE | Freq: Every day | ORAL | 1 refills | Status: DC

## 2020-08-20 MED ORDER — PANTOPRAZOLE SODIUM 40 MG PO TBEC: 40.0000 mg | DELAYED_RELEASE_TABLET | Freq: Two times a day (BID) | ORAL | 1 refills | Status: DC

## 2020-08-20 NOTE — Progress Notes (Unsigned)
Subjective:  Patient ID: Brandi Ramsey, female    DOB: 1950-09-28  Age: 70 y.o. MRN: [id number]  CC: Hyperlipidemia and Gastroesophageal Reflux  This visit occurred during the SARS-CoV-2 public health emergency.  Safety protocols were in place, including screening questions prior to the visit, additional usage of staff PPE, and extensive cleaning of exam room while observing appropriate contact time as indicated for disinfecting solutions.    HPI Brandi Ramsey presents for f/up -   She complains of a 4-year history of intermittent dizziness and lightheadedness and near syncope.  She relates this to when she underwent bariatric surgery.  She walks several miles a day and does not experience CP, DOE, palpitations, edema, or fatigue.  She complains of weight gain.  Outpatient Medications Prior to Visit  Medication Sig Dispense Refill  . Multiple Vitamin (MULTIVITAMIN) tablet Take 1 tablet by mouth daily.    [name] PHENobarbital (LUMINAL) 97.2 MG tablet Take 1 tablet (97.2 mg total) by mouth at bedtime. 30 tablet 0  . acetaminophen (TYLENOL) 325 MG tablet Take 650 mg by mouth every 6 (six) hours as needed for mild pain or fever.    [name] atorvastatin (LIPITOR) 80 MG tablet Take 1 tablet (80 mg total) by mouth daily. 90 tablet 1  . brexpiprazole (REXULTI) 2 MG TABS tablet Take 1 tablet (2 mg total) by mouth daily. 90 tablet 1  . buPROPion (WELLBUTRIN XL) 300 MG 24 hr tablet TAKE 1 TABLET BY MOUTH EVERY DAY 90 tablet 1  . clonazePAM (KLONOPIN) 1 MG tablet TAKE 1 TABLET (1 MG TOTAL) BY MOUTH 2 (TWO) TIMES DAILY AS NEEDED FOR ANXIETY. 60 tablet 0  . DULoxetine (CYMBALTA) 60 MG capsule TAKE 1 CAPSULE BY MOUTH EVERY DAY 90 capsule 1  . ibuprofen (ADVIL) 200 MG tablet Take 200 mg by mouth every 6 (six) hours as needed for fever or mild pain.     . meclizine (ANTIVERT) 12.5 MG tablet TAKE 1 TABLET BY MOUTH 3 (THREE) TIMES DAILY AS NEEDED FOR DIZZINESS. 45 tablet 1  . pantoprazole (PROTONIX) 40 MG tablet TAKE 1  TABLET BY MOUTH TWICE A DAY 180 tablet 1   No facility-administered medications prior to visit.    ROS Review of Systems  Constitutional: Positive for unexpected weight change. Negative for chills, diaphoresis and fatigue.  HENT: Negative.   Eyes: Negative for visual disturbance.  Respiratory: Negative for cough, chest tightness, shortness of breath and wheezing.   Cardiovascular: Negative for chest pain, palpitations and leg swelling.  Gastrointestinal: Negative for abdominal pain, anal bleeding, constipation, diarrhea, nausea and vomiting.  Endocrine: Negative.   Genitourinary: Negative.  Negative for difficulty urinating and frequency.  Musculoskeletal: Positive for arthralgias and back pain. Negative for myalgias.  Skin: Negative.  Negative for color change and pallor.  Neurological: Positive for dizziness and light-headedness. Negative for tremors, seizures, syncope, weakness and numbness.  Hematological: Negative for adenopathy. Does not bruise/bleed easily.  Psychiatric/Behavioral: Positive for dysphoric mood. Negative for agitation, decreased concentration, self-injury and sleep disturbance. The patient is nervous/anxious. The patient is not hyperactive.     Objective:  BP 116/70   Pulse 88   Temp 98.5 F (36.9 C) (Oral)   Resp 16   Ht 5\' 4"  (1.626 m)   Wt 156 lb (70.8 kg)   SpO2 98%   BMI 26.78 kg/m   BP Readings from Last 3 Encounters:  08/22/20 115/71  08/20/20 116/70  01/09/20 130/80    Wt Readings from Last 3 Encounters:  08/20/20 156 lb (70.8 kg)  01/09/20 153 lb (69.4 kg)  01/09/20 153 lb 9.6 oz (69.7 kg)    Physical Exam Vitals reviewed.  Constitutional:      Appearance: Normal appearance.  HENT:     Nose: Nose normal.     Mouth/Throat:     Mouth: Mucous membranes are moist.  Eyes:     General: No scleral icterus.    Conjunctiva/sclera: Conjunctivae normal.  Cardiovascular:     Rate and Rhythm: Normal rate and regular rhythm.     Heart  sounds: No murmur heard.     Comments: EKG-  NSR, 81 bpm Normal EKG Pulmonary:     Effort: Pulmonary effort is normal.     Breath sounds: No stridor. No wheezing, rhonchi or rales.  Abdominal:     General: Abdomen is protuberant. Bowel sounds are normal.     Palpations: Abdomen is soft.     Tenderness: There is no abdominal tenderness.  Musculoskeletal:        General: Normal range of motion.     Cervical back: Neck supple.     Right lower leg: No edema.     Left lower leg: No edema.  Lymphadenopathy:     Cervical: No cervical adenopathy.  Skin:    General: Skin is warm and dry.     Findings: No rash.  Neurological:     General: No focal deficit present.     Mental Status: She is alert and oriented to person, place, and time. Mental status is at baseline.     Cranial Nerves: No cranial nerve deficit.     Motor: No weakness.     Coordination: Coordination normal.     Gait: Gait normal.     Deep Tendon Reflexes: Reflexes normal.  Psychiatric:        Mood and Affect: Mood normal.     Lab Results  Component Value Date   WBC 7.5 08/20/2020   HGB 13.8 08/20/2020   HCT 40.6 08/20/2020   PLT 245.0 08/20/2020   GLUCOSE 94 08/20/2020   CHOL 178 08/20/2020   TRIG 93.0 08/20/2020   HDL 66.10 08/20/2020   LDLCALC 93 08/20/2020   ALT 19 08/20/2020   AST 22 08/20/2020   NA 136 08/20/2020   K 4.6 08/20/2020   CL 101 08/20/2020   CREATININE 0.86 08/20/2020   BUN 20 08/20/2020   CO2 28 08/20/2020   TSH 0.76 08/20/2020   HGBA1C 5.9 08/20/2020   MICROALBUR <0.7 01/17/2019    DG Chest Portable 1 View  Result Date: 02/11/2019 CLINICAL DATA:  Shortness of breath, suspected COVID-19 EXAM: PORTABLE CHEST 1 VIEW COMPARISON:  Radiograph 07/24/2017, CT 12/05/2015 FINDINGS: Hypoventilatory changes in the bases. No consolidation, features of edema, pneumothorax, or effusion. Pulmonary vascularity is normally distributed. The cardiomediastinal contours are unremarkable. No acute osseous  or soft tissue abnormality. Degenerative changes are present in the and imaged spine and shoulders. IMPRESSION: Low volumes with no other acute cardiopulmonary abnormality Electronically Signed   By: Lovena Le M.D.   On: 02/11/2019 16:35    Assessment & Plan:   Maudy was seen today for hyperlipidemia and gastroesophageal reflux.  Diagnoses and all orders for this visit:  Gastroesophageal reflux disease with esophagitis without hemorrhage- Her symptoms are well controlled. -     pantoprazole (PROTONIX) 40 MG tablet; Take 1 tablet (40 mg total) by mouth 2 (two) times daily. -     CBC with Differential/Platelet; Future -  CBC with Differential/Platelet  Severe episode of recurrent major depressive disorder, without psychotic features ([place]) -     Discontinue: brexpiprazole (REXULTI) 2 MG TABS tablet; Take 1 tablet (2 mg total) by mouth daily. -     buPROPion (WELLBUTRIN XL) 300 MG 24 hr tablet; Take 1 tablet (300 mg total) by mouth daily. -     brexpiprazole (REXULTI) 2 MG TABS tablet; Take 1 tablet (2 mg total) by mouth daily. -     CBC with Differential/Platelet; Future -     Basic metabolic panel; Future -     Hepatic function panel; Future -     TSH; Future -     TSH -     Hepatic function panel -     Basic metabolic panel -     CBC with Differential/Platelet  Hyperlipidemia with target LDL less than 100- She has achieved her LDL goal and is doing well on the statin. -     atorvastatin (LIPITOR) 80 MG tablet; Take 1 tablet (80 mg total) by mouth daily. -     Lipid panel; Future -     Hepatic function panel; Future -     Hepatic function panel -     Lipid panel  Moderate episode of recurrent major depressive disorder (HCC) -     buPROPion (WELLBUTRIN XL) 300 MG 24 hr tablet; Take 1 tablet (300 mg total) by mouth daily. -     DULoxetine (CYMBALTA) 60 MG capsule; Take 1 capsule (60 mg total) by mouth daily.  GAD (generalized anxiety disorder) -     clonazePAM (KLONOPIN) 1 MG  tablet; Take 1 tablet (1 mg total) by mouth 2 (two) times daily as needed for anxiety. -     TSH; Future -     TSH  Vertigo  Benign essential HTN- Her blood pressure is adequately well controlled. -     EKG 12-Lead  Cardiac conduction disorder- Her EKG is normal now.  I do not think her symptoms are related to cardiovascular disease. -     EKG 12-Lead  Visit for screening mammogram  Polyp of colon, unspecified part of colon, unspecified type -     Ambulatory referral to Gastroenterology  Left sided sciatica -     Ambulatory referral to Sports Medicine  Hyperglycemia- Her blood sugar is normal now. -     Hemoglobin A1c; Future -     Hemoglobin A1c   I have discontinued [name]. Bracher's ibuprofen, acetaminophen, and meclizine. I have also changed her buPROPion, DULoxetine, and pantoprazole. Additionally, I am having her maintain her multivitamin, PHENobarbital, atorvastatin, clonazePAM, and brexpiprazole.  Meds ordered this encounter  Medications  . DISCONTD: brexpiprazole (REXULTI) 2 MG TABS tablet    Sig: Take 1 tablet (2 mg total) by mouth daily.    Dispense:  90 tablet    Refill:  1  . atorvastatin (LIPITOR) 80 MG tablet    Sig: Take 1 tablet (80 mg total) by mouth daily.    Dispense:  90 tablet    Refill:  1  . buPROPion (WELLBUTRIN XL) 300 MG 24 hr tablet    Sig: Take 1 tablet (300 mg total) by mouth daily.    Dispense:  90 tablet    Refill:  1  . clonazePAM (KLONOPIN) 1 MG tablet    Sig: Take 1 tablet (1 mg total) by mouth 2 (two) times daily as needed for anxiety.    Dispense:  60 tablet  Refill:  0    This request is for a new prescription for a controlled substance as required by Federal/State law. DX Code Needed  .  [name] DULoxetine (CYMBALTA) 60 MG capsule    Sig: Take 1 capsule (60 mg total) by mouth daily.    Dispense:  90 capsule    Refill:  1  . pantoprazole (PROTONIX) 40 MG tablet    Sig: Take 1 tablet (40 mg total) by mouth 2 (two) times daily.     Dispense:  180 tablet    Refill:  1  . brexpiprazole (REXULTI) 2 MG TABS tablet    Sig: Take 1 tablet (2 mg total) by mouth daily.    Dispense:  90 tablet    Refill:  1     Follow-up: Return in about 3 months (around 11/17/2020).  Scarlette Calico, MD

## 2020-08-20 NOTE — Patient Instructions (Signed)

## 2020-08-21 NOTE — Progress Notes (Signed)
Subjective:   I, Brandi Ramsey, LAT, ATC acting as a scribe for Brandi Leader, MD.  I'm seeing this patient as a consultation for Dr. Scarlette Calico. Note will be routed back to referring provider/PCP.  CC: Left sided sciatica  HPI: Pt is a 70 y/o female c/o L-sided buttock ongoing for a few months. Pt reports she will bend over and pain will catch her off guard. Pt locates pain to L buttock that radiates distally to foot. Pt walks 60 mins a day.  Radiates: yes LE Numbness/tingling: no LE Weakness: no Aggravates: bending over Treatments tried: heat,  Past medical history, Surgical history, Family history, Social history, Allergies, and medications have been entered into the medical record, reviewed.   Review of Systems: No new headache, visual changes, nausea, vomiting, diarrhea, constipation, dizziness, abdominal pain, skin rash, fevers, chills, night sweats, weight loss, swollen lymph nodes, body aches, joint swelling, muscle aches, chest pain, shortness of breath, mood changes, visual or auditory hallucinations.   Objective:    Vitals:   08/22/20 1409  BP: 115/71  Pulse: 92   General: Well Developed, well nourished, and in no acute distress.  Neuro/Psych: Alert and oriented x3, extra-ocular muscles intact, able to move all 4 extremities, sensation grossly intact. Skin: Warm and dry, no rashes noted.  Respiratory: Not using accessory muscles, speaking in full sentences, trachea midline.  Cardiovascular: Pulses palpable, no extremity edema. Abdomen: Does not appear distended. MSK: L-spine normal-appearing Nontender midline. Tender palpation left inferior SI joint. Normal lumbar motion. Positive left-sided slump test and straight leg raise test. Lower extremity strength is intact distally. Reflexes are intact distal bilateral extremities.  Left hip normal-appearing Mild tender palpation greater trochanter and SI joint. Hip abduction and external rotation strength are  intact.   Lab and Radiology Results  X-ray images L-spine and left hip obtained today personally and independently interpreted  L-spine: Significant DDD L4-L5 and L1-L2.  No fractures or malalignment.  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  Mild left hip OA  Await formal radiology review  Procedure: Real-time Ultrasound Guided Injection of left SI joint Device: Philips Affiniti 50G Images permanently stored and available for review in PACS Verbal informed consent obtained.  Discussed risks and benefits of procedure. Warned about infection bleeding damage to structures skin hypopigmentation and fat atrophy among others. Patient expresses understanding and agreement Time-out conducted.   Noted no overlying erythema, induration, or other signs of local infection.   Skin prepped in a sterile fashion.   Local anesthesia: Topical Ethyl chloride.   With sterile technique and under real time ultrasound guidance:  40 mg of Kenalog and 2 ml of Marcaine injected into the SI joint. Fluid seen entering the joint capsule.   Completed without difficulty   Pain immediately resolved suggesting accurate placement of the medication.   Advised to call if fevers/chills, erythema, induration, drainage, or persistent bleeding.   Images permanently stored and available for review in the ultrasound unit.  Impression: Technically successful ultrasound guided injection.       Impression and Recommendations:    Assessment and Plan: 70 y.o. female with left SI joint pain with pain radiating down left leg.  Multifactorial pain.  Patient likely does have left sciatica or left piriformis syndrome.  Sciatica is favored.  Patient has positive slump test and straight leg raise test.  Plan to treat radicular pain with physical therapy Lyrica and potential course of prednisone in the future.  Check back in 6 weeks  if not improved consider MRI.  Additionally patient has an pain in left SI  joint.  She had great response immediately to injection in clinic.  Physical therapy should also be helpful as well.[name]  PDMP not reviewed this encounter. Orders Placed This Encounter  Procedures  . DG Lumbar Spine 2-3 Views    Standing Status:   Future    Number of Occurrences:   1    Standing Expiration Date:   08/22/2021    Order Specific Question:   Reason for Exam (SYMPTOM  OR DIAGNOSIS REQUIRED)    Answer:   eval left sciat    Order Specific Question:   Preferred imaging location?    Answer:   [facility]  . DG Hip Unilat W OR W/O Pelvis 2-3 Views Left    Standing Status:   Future    Number of Occurrences:   1    Standing Expiration Date:   08/22/2021    Order Specific Question:   Reason for Exam (SYMPTOM  OR DIAGNOSIS REQUIRED)    Answer:   eval left hip pain    Order Specific Question:   Preferred imaging location?    Answer:   [facility]  . [place] LIMITED JOINT SPACE STRUCTURES LOW LEFT(NO LINKED CHARGES)    Standing Status:   Future    Number of Occurrences:   1    Standing Expiration Date:   02/22/2021    Order Specific Question:   Reason for Exam (SYMPTOM  OR DIAGNOSIS REQUIRED)    Answer:   left hip pain    Order Specific Question:   Preferred imaging location?    Answer:   [facility]  . Ambulatory referral to Physical Therapy    Referral Priority:   Routine    Referral Type:   Physical Medicine    Referral Reason:   Specialty Services Required    Requested Specialty:   Physical Therapy   Meds ordered this encounter  Medications  . pregabalin (LYRICA) 50 MG capsule    Sig: Take 1 capsule (50 mg total) by mouth 2 (two) times daily as needed (nerve pain).    Dispense:  60 capsule    Refill:  1  . predniSONE (DELTASONE) 10 MG tablet    Sig: Take 3 tablets (30 mg total) by mouth daily with breakfast.    Dispense:  15 tablet    Refill:  0    Discussed warning signs or symptoms. Please see discharge instructions. Patient expresses  understanding.   The above documentation has been reviewed and is accurate and complete Brandi Ramsey, M.D.

## 2020-08-22 ENCOUNTER — Ambulatory Visit: Payer: Self-pay

## 2020-08-22 ENCOUNTER — Ambulatory Visit: Payer: PPO | Admitting: Family Medicine

## 2020-08-22 ENCOUNTER — Other Ambulatory Visit: Payer: Self-pay

## 2020-08-22 ENCOUNTER — Ambulatory Visit (INDEPENDENT_AMBULATORY_CARE_PROVIDER_SITE_OTHER): Payer: PPO

## 2020-08-22 VITALS — BP 115/71 | HR 92 | Ht 64.0 in

## 2020-08-22 DIAGNOSIS — M5432 Sciatica, left side: Secondary | ICD-10-CM | POA: Diagnosis not present

## 2020-08-22 DIAGNOSIS — M7918 Myalgia, other site: Secondary | ICD-10-CM | POA: Diagnosis not present

## 2020-08-22 DIAGNOSIS — M1612 Unilateral primary osteoarthritis, left hip: Secondary | ICD-10-CM | POA: Diagnosis not present

## 2020-08-22 DIAGNOSIS — M5136 Other intervertebral disc degeneration, lumbar region: Secondary | ICD-10-CM | POA: Diagnosis not present

## 2020-08-22 MED ORDER — PREDNISONE 10 MG PO TABS: 30.0000 mg | ORAL_TABLET | Freq: Every day | ORAL | 0 refills | Status: DC

## 2020-08-22 MED ORDER — PREGABALIN 50 MG PO CAPS: 50.0000 mg | ORAL_CAPSULE | Freq: Two times a day (BID) | ORAL | 1 refills | Status: DC | PRN

## 2020-08-22 NOTE — Patient Instructions (Addendum)
Thank you for coming in today.  I've referred you to Physical Therapy.  Let us know if you don't hear from them in one week.  Please get an Xray today before you leave  Call or go to the ER if you develop a large red swollen joint with extreme pain or oozing puss.   Use the medicine as needed.   Recheck in 6 weeks.

## 2020-08-23 NOTE — Progress Notes (Signed)
X-ray left hip shows mild arthritis

## 2020-08-23 NOTE — Progress Notes (Signed)
X-ray lumbar spine shows arthritis changes

## 2020-08-24 ENCOUNTER — Telehealth: Payer: Self-pay | Admitting: Family Medicine

## 2020-08-24 ENCOUNTER — Other Ambulatory Visit: Payer: Self-pay | Admitting: Internal Medicine

## 2020-08-24 MED ORDER — GABAPENTIN 300 MG PO CAPS: 300.0000 mg | ORAL_CAPSULE | Freq: Three times a day (TID) | ORAL | 3 refills | Status: DC | PRN

## 2020-08-24 NOTE — Telephone Encounter (Signed)
Okay to take the gabapentin instead.  Medicine sent to pharmacy.

## 2020-08-24 NOTE — Telephone Encounter (Signed)
Pt does not want to start the Lyrica as it can cause weight gain. She has had weight loss surgery in the past and is very sensitive about regaining weight, she is also hesitant about meds in general.  She would consider Gabapentin, if Dr. Georgina Snell agrees. She has been using heat and taking Advil, but needs something more. She has NOT started the prednisone.  Also, confirm she has viewed xray results in [facility].

## 2020-08-24 NOTE — Telephone Encounter (Signed)
Pt sent message via MyChart 

## 2020-08-27 NOTE — Telephone Encounter (Signed)
Left VM for pt to check their MyChart for response.

## 2020-08-28 ENCOUNTER — Telehealth: Payer: Self-pay | Admitting: Internal Medicine

## 2020-08-28 ENCOUNTER — Other Ambulatory Visit: Payer: Self-pay | Admitting: Internal Medicine

## 2020-08-28 DIAGNOSIS — G40309 Generalized idiopathic epilepsy and epileptic syndromes, not intractable, without status epilepticus: Secondary | ICD-10-CM

## 2020-08-28 MED ORDER — PHENOBARBITAL 97.2 MG PO TABS: 97.2000 mg | ORAL_TABLET | Freq: Every day | ORAL | 1 refills | Status: DC

## 2020-08-28 NOTE — Telephone Encounter (Signed)
Patient has no PHENobarbital (LUMINAL) 97.2 MG tablet remaining. Please refill to [id number] - [place], [place] - [id number]

## 2020-08-29 ENCOUNTER — Encounter: Payer: Self-pay | Admitting: Gastroenterology

## 2020-09-17 ENCOUNTER — Encounter: Payer: Self-pay | Admitting: Internal Medicine

## 2020-09-17 ENCOUNTER — Ambulatory Visit (INDEPENDENT_AMBULATORY_CARE_PROVIDER_SITE_OTHER)
Admission: RE | Admit: 2020-09-17 | Discharge: 2020-09-17 | Disposition: A | Discharge status: Home / Self Care | Payer: PPO | Source: Ambulatory Visit | Attending: Internal Medicine | Admitting: Internal Medicine

## 2020-09-17 ENCOUNTER — Ambulatory Visit (INDEPENDENT_AMBULATORY_CARE_PROVIDER_SITE_OTHER): Payer: PPO | Admitting: Internal Medicine

## 2020-09-17 ENCOUNTER — Other Ambulatory Visit: Payer: Self-pay

## 2020-09-17 VITALS — BP 122/70 | HR 85 | Temp 98.1°F | Resp 18 | Ht 64.0 in | Wt 154.8 lb

## 2020-09-17 DIAGNOSIS — R319 Hematuria, unspecified: Secondary | ICD-10-CM | POA: Insufficient documentation

## 2020-09-17 DIAGNOSIS — R310 Gross hematuria: Secondary | ICD-10-CM | POA: Diagnosis not present

## 2020-09-17 DIAGNOSIS — R10A0 Flank pain, unspecified side: Secondary | ICD-10-CM

## 2020-09-17 DIAGNOSIS — R109 Unspecified abdominal pain: Secondary | ICD-10-CM

## 2020-09-17 DIAGNOSIS — R350 Frequency of micturition: Secondary | ICD-10-CM

## 2020-09-17 DIAGNOSIS — M4186 Other forms of scoliosis, lumbar region: Secondary | ICD-10-CM | POA: Diagnosis not present

## 2020-09-17 DIAGNOSIS — R1030 Lower abdominal pain, unspecified: Secondary | ICD-10-CM

## 2020-09-17 DIAGNOSIS — N281 Cyst of kidney, acquired: Secondary | ICD-10-CM | POA: Diagnosis not present

## 2020-09-17 DIAGNOSIS — M5136 Other intervertebral disc degeneration, lumbar region: Secondary | ICD-10-CM | POA: Diagnosis not present

## 2020-09-17 DIAGNOSIS — K449 Diaphragmatic hernia without obstruction or gangrene: Secondary | ICD-10-CM | POA: Diagnosis not present

## 2020-09-17 LAB — POCT URINALYSIS DIPSTICK
Glucose, UA: NEGATIVE
Nitrite, UA: POSITIVE
Protein, UA: POSITIVE — AB
Spec Grav, UA: 1.025 (ref 1.010–1.025)
Urobilinogen, UA: 2 E.U./dL — AB
pH, UA: 6.5 (ref 5.0–8.0)

## 2020-09-17 MED ORDER — CYCLOBENZAPRINE HCL 10 MG PO TABS: 10.0000 mg | ORAL_TABLET | Freq: Three times a day (TID) | ORAL | 0 refills | Status: DC | PRN

## 2020-09-17 MED ORDER — CIPROFLOXACIN HCL 500 MG PO TABS: 500.0000 mg | ORAL_TABLET | Freq: Two times a day (BID) | ORAL | 0 refills | Status: AC

## 2020-09-17 NOTE — Patient Instructions (Signed)
We have sent in flexeril to try for the muscle relaxer to take up to 3 times a day as needed for pain.  We have sent in ciprofloxacin to take for antibiotic 1 pill twice a day for 1 week to clear the infection.  We are getting a CT scan to rule out kidney stone.

## 2020-09-17 NOTE — Assessment & Plan Note (Signed)
Due to allergies and intolerances options are limited for outpatient treatment of pain. Due to severity we did discuss her going to ER for pain management but she does not want to do this. Ordered CT stone study without contrast to evaluate for kidney stones. U/A done in office consistent with infection and hematuria. We cannot give her toradol injection due to her seizure disorder. Rx flexeril to see if this helps with pain. If pain worsens she is strongly encouraged to go to ER for pain management.

## 2020-09-17 NOTE — Progress Notes (Signed)
   Subjective:   Patient ID: Brandi Ramsey, female    DOB: 01/06/1951, 70 y.o.   MRN: [id number]  HPI The patient is a 70 YO female coming in for concerns about bleeding and abdominal pain. Woke up last night with pain and pressure in her abdomen. Was bleeding like a period so had to put on pull up. She is s/p hysterectomy in the past. She can tell blood is coming from urethra region. Is not coming from vaginal region or buttock. She is having flank pain and lower abdomen pain currently. It is 10/10 pain. Overall not improving since onset. Ramsey with kidney stones she has no history of this. Denies fevers or chills. Does have limited appetite due to pain. No nausea or vomiting.  Review of Systems  Constitutional: Positive for activity change and appetite change.  HENT: Negative.   Eyes: Negative.   Respiratory: Negative for cough, chest tightness and shortness of breath.   Cardiovascular: Negative for chest pain, palpitations and leg swelling.  Gastrointestinal: Positive for abdominal pain. Negative for abdominal distention, constipation, diarrhea, nausea and vomiting.  Genitourinary: Positive for dysuria, flank pain, frequency, hematuria and urgency.  Skin: Negative.   Neurological: Negative.   Psychiatric/Behavioral: Negative.     Objective:  Physical Exam Constitutional:      General: She is in acute distress.     Appearance: She is well-developed.     Comments: Appears in pain  HENT:     Head: Normocephalic and atraumatic.  Cardiovascular:     Rate and Rhythm: Normal rate and regular rhythm.  Pulmonary:     Effort: Pulmonary effort is normal. No respiratory distress.     Breath sounds: Normal breath sounds. No wheezing or rales.  Abdominal:     General: Bowel sounds are normal. There is no distension.     Palpations: Abdomen is soft.     Tenderness: There is abdominal tenderness. There is right CVA tenderness and left CVA tenderness. There is no rebound.  Musculoskeletal:         General: Tenderness present.     Cervical back: Normal range of motion.  Skin:    General: Skin is warm and dry.  Neurological:     Mental Status: She is alert and oriented to person, place, and time.     Coordination: Coordination normal.     Vitals:   09/17/20 0923  BP: 122/70  Pulse: 85  Resp: 18  Temp: 98.1 F (36.7 C)  TempSrc: Oral  SpO2: 99%  Weight: 154 lb 12.8 oz (70.2 kg)  Height: 5\' 4"  (1.626 m)   This visit occurred during the SARS-CoV-2 public health emergency.  Safety protocols were in place, including screening questions prior to the visit, additional usage of staff PPE, and extensive cleaning of exam room while observing appropriate contact time as indicated for disinfecting solutions.   Assessment & Plan:  Visit time 20 minutes in face to face communication with patient and coordination of care, additional 10 minutes spent in record review, coordination or care, ordering tests, communicating/referring to other healthcare professionals, documenting in medical records all on the same day of the visit for total time 30 minutes spent on the visit.

## 2020-09-17 NOTE — Assessment & Plan Note (Signed)
Could be infection versus kidney stone. POC U/A done with signs of infection. Will treat with ciprofloxacin 1 week. Getting CT renal to rule out kidney stones.

## 2020-09-24 ENCOUNTER — Other Ambulatory Visit: Payer: Self-pay | Admitting: Internal Medicine

## 2020-09-24 DIAGNOSIS — F411 Generalized anxiety disorder: Secondary | ICD-10-CM

## 2020-10-03 ENCOUNTER — Telehealth: Payer: Self-pay | Admitting: Gastroenterology

## 2020-10-03 NOTE — Telephone Encounter (Signed)
Left message on machine to call back  

## 2020-10-03 NOTE — Telephone Encounter (Signed)
Patient called has questions about her hemorrhoids and possibly having banding done.Would like to know if she can schedule prior to her upcoming procedure .

## 2020-10-08 NOTE — Telephone Encounter (Signed)
Patient states her questions have been answered and thanked me for the call

## 2020-10-10 ENCOUNTER — Ambulatory Visit: Payer: PPO | Admitting: Family Medicine

## 2020-11-01 ENCOUNTER — Other Ambulatory Visit: Payer: Self-pay

## 2020-11-01 ENCOUNTER — Ambulatory Visit (AMBULATORY_SURGERY_CENTER): Payer: Self-pay | Admitting: *Deleted

## 2020-11-01 ENCOUNTER — Telehealth: Payer: Self-pay | Admitting: Gastroenterology

## 2020-11-01 VITALS — Ht 64.0 in | Wt 158.0 lb

## 2020-11-01 DIAGNOSIS — Z86010 Personal history of colonic polyps: Secondary | ICD-10-CM

## 2020-11-01 MED ORDER — SUTAB 1479-225-188 MG PO TABS: 1.0000 | ORAL_TABLET | ORAL | 0 refills | Status: DC

## 2020-11-01 NOTE — Telephone Encounter (Signed)
Patient requesting to have hem banding prior to the colonoscopy.  We discussed she needs to have colonoscopy first and Dr. Fuller Plan will assess at that time if banding is appropriate for her.  She is advised can try recticare during her prep time to decrease pain with hemorrhoids.

## 2020-11-01 NOTE — Progress Notes (Signed)
Patient is here in-person for PV. Patient denies any allergies to eggs or soy. Patient denies any problems with anesthesia/sedation. Patient denies any oxygen use at home. Patient denies taking any diet/weight loss medications or blood thinners. Patient is not being treated for MRSA or C-diff. Patient is aware of our care-partner policy and BRKVT-55 safety protocol. EMMI education assigned to the patient for the procedure, sent to [facility].   Patient is COVID-19 vaccinated, per patient.   Pt requested the Sutab/pills prep. Prep Prescription coupon given to the patient.

## 2020-11-08 ENCOUNTER — Encounter: Payer: Self-pay | Admitting: Gastroenterology

## 2020-11-12 ENCOUNTER — Telehealth: Payer: Self-pay | Admitting: Gastroenterology

## 2020-11-12 NOTE — Telephone Encounter (Signed)
Call patient, informed her that she can start the prep 1 hour before start time. Pt verbalizes understanding.

## 2020-11-12 NOTE — Telephone Encounter (Signed)
Called patient to remind her of her procedure appointment. Patient has had gastric sleeve in 2017 and would like to  know if she could start prep earlier?

## 2020-11-14 NOTE — Telephone Encounter (Signed)
Pt informed to start prep at 2 am Thursday   She asked about starting prep today at 230 pm- instructed her she can so 1st pills today at 5 pm but not 230 pm today   Pt verbalized understanding   Brandi Ramsey PV

## 2020-11-14 NOTE — Telephone Encounter (Signed)
Inbound call from patient. Procedure is tomorrow 11/15/20 with Dr. Fuller Plan. Wanted a reminder about exact times to start medications during the night since procedure time have changed. I advised her of an hour before but she wanted to hear for sure for clinical. Best contact number [phone]

## 2020-11-15 ENCOUNTER — Encounter: Payer: Self-pay | Admitting: Gastroenterology

## 2020-11-15 ENCOUNTER — Ambulatory Visit (AMBULATORY_SURGERY_CENTER): Payer: PPO | Admitting: Gastroenterology

## 2020-11-15 ENCOUNTER — Other Ambulatory Visit: Payer: Self-pay

## 2020-11-15 VITALS — BP 112/61 | HR 73 | Temp 97.1°F | Resp 12 | Ht 64.0 in | Wt 158.0 lb

## 2020-11-15 DIAGNOSIS — K219 Gastro-esophageal reflux disease without esophagitis: Secondary | ICD-10-CM | POA: Diagnosis not present

## 2020-11-15 DIAGNOSIS — Z86010 Personal history of colonic polyps: Secondary | ICD-10-CM

## 2020-11-15 DIAGNOSIS — E669 Obesity, unspecified: Secondary | ICD-10-CM | POA: Diagnosis not present

## 2020-11-15 DIAGNOSIS — R270 Ataxia, unspecified: Secondary | ICD-10-CM | POA: Diagnosis not present

## 2020-11-15 DIAGNOSIS — Z860101 Personal history of adenomatous and serrated colon polyps: Secondary | ICD-10-CM

## 2020-11-15 DIAGNOSIS — Z1211 Encounter for screening for malignant neoplasm of colon: Secondary | ICD-10-CM | POA: Diagnosis not present

## 2020-11-15 MED ORDER — SODIUM CHLORIDE 0.9 % IV SOLN: 500.0000 mL | Freq: Once | INTRAVENOUS | Status: DC

## 2020-11-15 NOTE — Progress Notes (Signed)
Report to PACU, RN, vss, BBS= Clear.  

## 2020-11-15 NOTE — Progress Notes (Signed)
Medical history reviewed with no changes noted since PV. VS assessed by S.B

## 2020-11-15 NOTE — Patient Instructions (Signed)
Discharge instructions given. °Handouts on Diverticulosis and Hemorrhoids. °Resume previous medications. °YOU HAD AN ENDOSCOPIC PROCEDURE TODAY AT THE [facility]:   Refer to the procedure report that was given to you for any specific questions about what was found during the examination.  If the procedure report does not answer your questions, please call your gastroenterologist to clarify.  If you requested that your care partner not be given the details of your procedure findings, then the procedure report has been included in a sealed envelope for you to review at your convenience later. ° °YOU SHOULD EXPECT: Some feelings of bloating in the abdomen. Passage of more gas than usual.  Walking can help get rid of the air that was put into your GI tract during the procedure and reduce the bloating. If you had a lower endoscopy (such as a colonoscopy or flexible sigmoidoscopy) you may notice spotting of blood in your stool or on the toilet paper. If you underwent a bowel prep for your procedure, you may not have a normal bowel movement for a few days. ° °Please Note:  You might notice some irritation and congestion in your nose or some drainage.  This is from the oxygen used during your procedure.  There is no need for concern and it should clear up in a day or so. ° °SYMPTOMS TO REPORT IMMEDIATELY: ° °Following lower endoscopy (colonoscopy or flexible sigmoidoscopy): ° Excessive amounts of blood in the stool ° Significant tenderness or worsening of abdominal pains ° Swelling of the abdomen that is new, acute ° Fever of 100°F or higher ° ° °For urgent or emergent issues, a gastroenterologist can be reached at any hour by calling [phone]. °Do not use MyChart messaging for urgent concerns.  ° ° °DIET:  We do recommend a small meal at first, but then you may proceed to your regular diet.  Drink plenty of fluids but you should avoid alcoholic beverages for 24 hours. ° °ACTIVITY:  You should plan to  take it easy for the rest of today and you should NOT DRIVE or use heavy machinery until tomorrow (because of the sedation medicines used during the test).   ° °FOLLOW UP: °Our staff will call the number listed on your records 48-72 hours following your procedure to check on you and address any questions or concerns that you may have regarding the information given to you following your procedure. If we do not reach you, we will leave a message.  We will attempt to reach you two times.  During this call, we will ask if you have developed any symptoms of COVID 19. If you develop any symptoms (ie: fever, flu-like symptoms, shortness of breath, cough etc.) before then, please call [phone].  If you test positive for Covid 19 in the 2 weeks post procedure, please call and report this information to us.   ° °If any biopsies were taken you will be contacted by phone or by letter within the next 1-3 weeks.  Please call us at [phone] if you have not heard about the biopsies in 3 weeks.  ° ° °SIGNATURES/CONFIDENTIALITY: °You and/or your care partner have signed paperwork which will be entered into your electronic medical record.  These signatures attest to the fact that that the information above on your After Visit Summary has been reviewed and is understood.  Full responsibility of the confidentiality of this discharge information lies with you and/or your care-partner.  °

## 2020-11-15 NOTE — Op Note (Signed)
[facility] Patient Name: Brandi Ramsey Procedure Date: 11/15/2020 7:59 AM MRN: [id number] Endoscopist: Ladene Artist , MD Age: 70 Referring MD:  Date of Birth: 1950-10-11 Gender: Female Account #: [id number] Procedure:                Colonoscopy Indications:              Surveillance: Personal history of adenomatous                            polyps on last colonoscopy 5 years ago Medicines:                Monitored Anesthesia Care Procedure:                Pre-Anesthesia Assessment:                           - Prior to the procedure, a History and Physical                            was performed, and patient medications and                            allergies were reviewed. The patient's tolerance of                            previous anesthesia was also reviewed. The risks                            and benefits of the procedure and the sedation                            options and risks were discussed with the patient.                            All questions were answered, and informed consent                            was obtained. Prior Anticoagulants: The patient has                            taken no previous anticoagulant or antiplatelet                            agents. ASA Grade Assessment: II - A patient with                            mild systemic disease. After reviewing the risks                            and benefits, the patient was deemed in                            satisfactory condition to undergo the procedure.  After obtaining informed consent, the colonoscope                            was passed under direct vision. Throughout the                            procedure, the patient's blood pressure, pulse, and                            oxygen saturations were monitored continuously. The                            Olympus [id number]-[id number] (#[id number]) Colonoscope was                            introduced through the anus and  advanced to the the                            cecum, identified by appendiceal orifice and                            ileocecal valve. The ileocecal valve, appendiceal                            orifice, and rectum were photographed. The quality                            of the bowel preparation was good after extensive                            lavage, suction. The colonoscopy was performed                            without difficulty. The patient tolerated the                            procedure well. Scope In: 8:05:02 AM Scope Out: 8:23:19 AM Scope Withdrawal Time: 0 hours 12 minutes 35 seconds  Total Procedure Duration: 0 hours 18 minutes 17 seconds  Findings:                 The perianal and digital rectal examinations were                            normal.                           A few small-mouthed diverticula were found in the                            left colon. There was no evidence of diverticular                            bleeding.  External hemorrhoids were found during                            retroflexion. The hemorrhoids were moderate.                           The exam was otherwise without abnormality on                            direct and retroflexion views. Complications:            No immediate complications. Estimated blood loss:                            None. Estimated Blood Loss:     Estimated blood loss: none. Impression:               - Mild diverticulosis in the left colon.                           - External hemorrhoids.                           - The examination was otherwise normal on direct                            and retroflexion views.                           - No specimens collected. Recommendation:           - Repeat colonoscopy in 7 years for surveillance.                           - Patient has a contact number available for                            emergencies. The signs and symptoms of potential                             delayed complications were discussed with the                            patient. Return to normal activities tomorrow.                            Written discharge instructions were provided to the                            patient.                           - Resume previous diet.                           - Continue present medications.                           -  Miralax 1-2 times daily for constipation. Ladene Artist, MD 11/15/2020 8:28:59 AM This report has been signed electronically.

## 2020-11-20 ENCOUNTER — Telehealth: Payer: Self-pay

## 2020-11-20 DIAGNOSIS — H04123 Dry eye syndrome of bilateral lacrimal glands: Secondary | ICD-10-CM | POA: Diagnosis not present

## 2020-11-20 DIAGNOSIS — Z961 Presence of intraocular lens: Secondary | ICD-10-CM | POA: Diagnosis not present

## 2020-11-20 NOTE — Telephone Encounter (Signed)
  Follow up Call-  Call back number 11/15/2020  Post procedure Call Back phone  # [phone]  Permission to leave phone message Yes  Some recent data might be hidden     Patient questions:  Do you have a fever, pain , or abdominal swelling? No. Pain Score  0 *  Have you tolerated food without any problems? Yes.    Have you been able to return to your normal activities? Yes.    Do you have any questions about your discharge instructions: Diet   No. Medications  No. Follow up visit  No.  Do you have questions or concerns about your Care? No.  Actions: * If pain score is 4 or above: No action needed, pain <4.  1. Have you developed a fever since your procedure? no  2.   Have you had an respiratory symptoms (SOB or cough) since your procedure? no  3.   Have you tested positive for COVID 19 since your procedure no  4.   Have you had any family members/close contacts diagnosed with the COVID 19 since your procedure?  no   If yes to any of these questions please route to Joylene John, RN and Joella Prince, RN

## 2020-11-22 ENCOUNTER — Telehealth: Payer: Self-pay | Admitting: Internal Medicine

## 2020-11-22 NOTE — Telephone Encounter (Signed)
Patient called and is wanting Dr. Ronnald Ramp' opinion about going on a cruise in July. She said that she has received all her Covid 19 vaccines. She said that she is nervous and is requesting a call back at [phone] or send a mychart message.  Please advise.

## 2020-11-27 ENCOUNTER — Encounter: Payer: Self-pay | Admitting: Internal Medicine

## 2020-11-28 ENCOUNTER — Other Ambulatory Visit: Payer: Self-pay | Admitting: Internal Medicine

## 2020-11-28 DIAGNOSIS — T753XXA Motion sickness, initial encounter: Secondary | ICD-10-CM | POA: Insufficient documentation

## 2020-11-28 MED ORDER — SCOPOLAMINE 1 MG/3DAYS TD PT72
1.0000 | MEDICATED_PATCH | TRANSDERMAL | 0 refills | Status: DC
Start: 2020-11-28 — End: 2021-06-25

## 2020-12-12 ENCOUNTER — Other Ambulatory Visit: Payer: Self-pay | Admitting: Internal Medicine

## 2020-12-12 DIAGNOSIS — K2100 Gastro-esophageal reflux disease with esophagitis, without bleeding: Secondary | ICD-10-CM

## 2020-12-17 ENCOUNTER — Telehealth: Payer: Self-pay | Admitting: Internal Medicine

## 2020-12-17 NOTE — Telephone Encounter (Signed)
Melissa from [facility] called  Requesting verbals to give the PHENobarbital (LUMINAL) 97.2 MG tablet to patient early. Patient is going on vacation on Thursday and will not be back in time to pick up the refill when it is due.   Please advise.    Callback # [phone]

## 2020-12-18 ENCOUNTER — Telehealth: Payer: Self-pay | Admitting: Internal Medicine

## 2020-12-18 NOTE — Telephone Encounter (Signed)
Verbals given  

## 2020-12-18 NOTE — Telephone Encounter (Signed)
Patient has called stating she needs a refill for  PHENobarbital (LUMINAL) 97.2 MG tablet  There is 1 order left but the pharmacy is not able to refill it until 7.3.22; pt is leaving for vacation and would need it before then. The pharmacy just needs approval from the provider to refill this prescription early   [id number], [place] - [address] Phone:  [phone]

## 2020-12-18 NOTE — Telephone Encounter (Signed)
Verbal has been given to pharmacist to fill.

## 2021-01-07 ENCOUNTER — Telehealth: Payer: Self-pay | Admitting: Internal Medicine

## 2021-01-07 NOTE — Chronic Care Management (AMB) (Signed)
  Care Management  Note   01/07/2021 Name: Brandi Ramsey MRN: [id number] DOB: [birth year]  [name] is a 70 y.o. year old female who is a primary care patient of Janith Lima, MD. The care management team was consulted for assistance with chronic disease management and care coordination needs.   [name] was given information about Care Management services today including:  CCM service includes personalized support from designated clinical staff supervised by the physician, including individualized plan of care and coordination with other care providers 24/7 contact phone numbers for assistance for urgent and routine care needs. Service will only be billed when office clinical staff spend 20 minutes or more in a month to coordinate care. Only one practitioner may furnish and bill the service in a calendar month. The patient may stop CCM services at amy time (effective at the end of the month) by phone call to the office staff. The patient will be responsible for cost sharing (co-pay) or up to 20% of the service fee (after annual deductible is met)  Patient agreed to services and verbal consent obtained.  Follow up plan:   Face to Face appointment with care management team member scheduled for: 02/08/21 $RemoveBefo'@2pm'[id number]$   Noelle Penner [employer] Scheduler

## 2021-01-09 DIAGNOSIS — J029 Acute pharyngitis, unspecified: Secondary | ICD-10-CM | POA: Diagnosis not present

## 2021-01-09 DIAGNOSIS — J0140 Acute pansinusitis, unspecified: Secondary | ICD-10-CM | POA: Diagnosis not present

## 2021-01-17 DIAGNOSIS — Z20822 Contact with and (suspected) exposure to covid-19: Secondary | ICD-10-CM | POA: Diagnosis not present

## 2021-01-17 DIAGNOSIS — J309 Allergic rhinitis, unspecified: Secondary | ICD-10-CM | POA: Diagnosis not present

## 2021-01-23 ENCOUNTER — Telehealth: Payer: Self-pay

## 2021-01-23 NOTE — Chronic Care Management (AMB) (Signed)
     Chronic Care Management Pharmacy Assistant   Name: TARINA SCALISE             MRN: [id number]        DOB: 08/30/1950   Brandi Ramsey is an 70 y.o. year old female who presents for his initial CCM visit with the clinical pharmacist.   Recent office visits:  09/17/20-[name], MD. Seen for UTI. Start on flexeril to try for the muscle relaxer to take up to 3 times a day as needed for pain and start ciprofloxacin to take for antibiotic 1 pill twice a day for 1 week to clear the infection. CT scan ordered to rule out kidney stone. 08/20/20-[name], MD (PCP) Seen for Hyperlipidemia and Gastroesophageal reflux. Discontinue: brexpiprazole (REXULTI) 2 MG TABS tablet; Take 1 tablet (2 mg total) by mouth daily. Labs ordered. EKG completed. Ambulatory referral to Gastroenterology. Ambulatory referral to Sports Medicine. Follow up in 3 months. Recent consult visits:  11/20/20-[name] [facility]) Notes are not available. 11/15/20-[name], MD (Gastroenterology) Colonoscopy Procedure visit. 08/22/20-[name], MD (Sports Medicine) Initial visit. Ambulatory referral to physical Therapy. Ultrasound ordered. Start on Lyrica 50 mg cap. And Prednisone 10 mg tab. Follow up in 6 weeks.  Medications: Outpatient Encounter Medications as of 01/23/2021  Medication Sig   Ascorbic Acid (VITAMIN C PO) Take by mouth.   atorvastatin (LIPITOR) 80 MG tablet Take 1 tablet (80 mg total) by mouth daily.   buPROPion (WELLBUTRIN XL) 300 MG 24 hr tablet Take 1 tablet (300 mg total) by mouth daily.   clonazePAM (KLONOPIN) 1 MG tablet TAKE 1 TABLET BY MOUTH 2 TIMES DAILY AS NEEDED FOR ANXIETY.   DULoxetine (CYMBALTA) 60 MG capsule Take 1 capsule (60 mg total) by mouth daily.   pantoprazole (PROTONIX) 40 MG tablet TAKE 1 TABLET BY MOUTH TWICE A DAY   PHENobarbital (LUMINAL) 97.2 MG tablet Take 1 tablet (97.2 mg total) by mouth at bedtime.   scopolamine (TRANSDERM-SCOP, 1.5 MG,) 1 MG/3DAYS  Place 1 patch (1.5 mg total) onto the skin every 3 (three) days.   VITAMIN D PO Take by mouth.   zinc gluconate 50 MG tablet Take 50 mg by mouth daily.   No facility-administered encounter medications on file as of 01/23/2021.   Ascorbic Acid (VITAMIN C PO) Last filled:None noted Atorvastatin (LIPITOR) 80 MG tablet Last filled:01/22/21 90 DS BuPROPion (WELLBUTRIN XL) 300 MG 24 hr tablet Last filled:12/12/20 90 DS ClonazePAM (KLONOPIN) 1 MG tablet Last filled:12/12/20 30 DS DULoxetine (CYMBALTA) 60 MG capsule Last filled:11/17/20 90 DS Pantoprazole (PROTONIX) 40 MG tablet Last filled:None noted PHENobarbital (LUMINAL) 97.2 MG tablet Last filled:12/18/20 30 DS Scopolamine (TRANSDERM-SCOP, 1.5 MG,) 1 MG/3DAYS Last filled:11/28/20 12 DS VITAMIN D PO Last filled: None noted Zinc gluconate 50 MG tablet Last filled: None noted   Star Rating Drugs: Atorvastatin (LIPITOR) 80 MG tablet Last filled:01/22/21 90 DS   Myriam Elta Guadel[place], [place]

## 2021-01-23 NOTE — Progress Notes (Deleted)
    Chronic Care Management Pharmacy Assistant   Name: Brandi Ramsey  MRN: [id number] DOB: Jan 04, 1951  Brandi Ramsey is an 70 y.o. year old female who presents for his initial CCM visit with the clinical pharmacist.  Recent office visits:  09/17/20-[name], MD. Seen for UTI. Start on flexeril to try for the muscle relaxer to take up to 3 times a day as needed for pain and start ciprofloxacin to take for antibiotic 1 pill twice a day for 1 week to clear the infection. CT scan ordered to rule out kidney stone. 08/20/20-[name], MD (PCP) Seen for Hyperlipidemia and Gastroesophageal reflux. Discontinue: brexpiprazole (REXULTI) 2 MG TABS tablet; Take 1 tablet (2 mg total) by mouth daily. Labs ordered. EKG completed. Ambulatory referral to Gastroenterology. Ambulatory referral to Sports Medicine. Follow up in 3 months. Recent consult visits:  11/20/20-[name] [facility]) Notes are not available. 11/15/20-[name], MD (Gastroenterology) Colonoscopy Procedure visit. 08/22/20-[name], MD (Sports Medicine) Initial visit. Ambulatory referral to physical Therapy. Ultrasound ordered. Start on Lyrica 50 mg cap. And Prednisone 10 mg tab. Follow up in 6 weeks.  Hospital visits:  None in previous 6 months  Medications: Outpatient Encounter Medications as of 01/23/2021  Medication Sig   Ascorbic Acid (VITAMIN C PO) Take by mouth.   atorvastatin (LIPITOR) 80 MG tablet Take 1 tablet (80 mg total) by mouth daily.   buPROPion (WELLBUTRIN XL) 300 MG 24 hr tablet Take 1 tablet (300 mg total) by mouth daily.   clonazePAM (KLONOPIN) 1 MG tablet TAKE 1 TABLET BY MOUTH 2 TIMES DAILY AS NEEDED FOR ANXIETY.   DULoxetine (CYMBALTA) 60 MG capsule Take 1 capsule (60 mg total) by mouth daily.   pantoprazole (PROTONIX) 40 MG tablet TAKE 1 TABLET BY MOUTH TWICE A DAY   PHENobarbital (LUMINAL) 97.2 MG tablet Take 1 tablet (97.2 mg total) by mouth at bedtime.   scopolamine  (TRANSDERM-SCOP, 1.5 MG,) 1 MG/3DAYS Place 1 patch (1.5 mg total) onto the skin every 3 (three) days.   VITAMIN D PO Take by mouth.   zinc gluconate 50 MG tablet Take 50 mg by mouth daily.   No facility-administered encounter medications on file as of 01/23/2021.   Ascorbic Acid (VITAMIN C PO) Last filled:None noted Atorvastatin (LIPITOR) 80 MG tablet Last filled:01/22/21 90 DS BuPROPion (WELLBUTRIN XL) 300 MG 24 hr tablet Last filled:12/12/20 90 DS ClonazePAM (KLONOPIN) 1 MG tablet Last filled:12/12/20 30 DS DULoxetine (CYMBALTA) 60 MG capsule Last filled:11/17/20 90 DS Pantoprazole (PROTONIX) 40 MG tablet Last filled:None noted PHENobarbital (LUMINAL) 97.2 MG tablet Last filled:12/18/20 30 DS Scopolamine (TRANSDERM-SCOP, 1.5 MG,) 1 MG/3DAYS Last filled:11/28/20 12 DS VITAMIN D PO Last filled: None noted Zinc gluconate 50 MG tablet Last filled: None noted  Star Rating Drugs: Atorvastatin (LIPITOR) 80 MG tablet Last filled:01/22/21 90 DS  Myriam Elta [place], [place]

## 2021-02-08 ENCOUNTER — Telehealth: Payer: PPO

## 2021-02-21 ENCOUNTER — Other Ambulatory Visit: Payer: Self-pay | Admitting: Internal Medicine

## 2021-02-21 DIAGNOSIS — G40309 Generalized idiopathic epilepsy and epileptic syndromes, not intractable, without status epilepticus: Secondary | ICD-10-CM

## 2021-02-23 ENCOUNTER — Other Ambulatory Visit: Payer: Self-pay | Admitting: Internal Medicine

## 2021-02-23 DIAGNOSIS — F332 Major depressive disorder, recurrent severe without psychotic features: Secondary | ICD-10-CM

## 2021-02-23 DIAGNOSIS — F331 Major depressive disorder, recurrent, moderate: Secondary | ICD-10-CM

## 2021-03-04 ENCOUNTER — Encounter: Payer: Self-pay | Admitting: Internal Medicine

## 2021-03-08 ENCOUNTER — Telehealth: Payer: PPO

## 2021-03-23 ENCOUNTER — Other Ambulatory Visit: Payer: Self-pay | Admitting: Internal Medicine

## 2021-03-23 DIAGNOSIS — F411 Generalized anxiety disorder: Secondary | ICD-10-CM

## 2021-04-01 ENCOUNTER — Telehealth: Payer: Self-pay | Admitting: Internal Medicine

## 2021-04-01 NOTE — Telephone Encounter (Signed)
Left message for patient to call me back at [phone] to schedule Medicare Annual Wellness Visit   Last AWV  01/09/20  Please schedule at anytime with [facility] if patient calls the office back.    40 Minutes appointment   Any questions, please call me at [phone]

## 2021-04-20 ENCOUNTER — Other Ambulatory Visit: Payer: Self-pay | Admitting: Internal Medicine

## 2021-04-20 DIAGNOSIS — E785 Hyperlipidemia, unspecified: Secondary | ICD-10-CM

## 2021-05-09 ENCOUNTER — Telehealth: Payer: Self-pay

## 2021-05-09 NOTE — Telephone Encounter (Signed)
LVM for patient to call and schedule a wellness exam before 06/21/21.

## 2021-05-23 ENCOUNTER — Other Ambulatory Visit: Payer: Self-pay | Admitting: Internal Medicine

## 2021-05-23 DIAGNOSIS — F331 Major depressive disorder, recurrent, moderate: Secondary | ICD-10-CM

## 2021-05-25 ENCOUNTER — Other Ambulatory Visit: Payer: Self-pay | Admitting: Internal Medicine

## 2021-05-25 DIAGNOSIS — F411 Generalized anxiety disorder: Secondary | ICD-10-CM

## 2021-06-21 ENCOUNTER — Telehealth: Payer: Self-pay

## 2021-06-21 NOTE — Telephone Encounter (Signed)
Pt calling requesting refill on Phenobarbital 97.2 mg will run out on Tuesday 06/25/21 but will be leaving to see son and don't know how long she will be out of town. She understand that it needs to be 30 days before the refill but needs  it due to [employer] health.

## 2021-06-22 ENCOUNTER — Other Ambulatory Visit: Payer: Self-pay | Admitting: Internal Medicine

## 2021-06-22 DIAGNOSIS — F411 Generalized anxiety disorder: Secondary | ICD-10-CM

## 2021-06-25 ENCOUNTER — Other Ambulatory Visit: Payer: Self-pay | Admitting: Internal Medicine

## 2021-06-25 ENCOUNTER — Telehealth: Payer: Self-pay

## 2021-06-25 DIAGNOSIS — G40309 Generalized idiopathic epilepsy and epileptic syndromes, not intractable, without status epilepticus: Secondary | ICD-10-CM

## 2021-06-25 DIAGNOSIS — F411 Generalized anxiety disorder: Secondary | ICD-10-CM

## 2021-06-25 MED ORDER — PHENOBARBITAL 97.2 MG PO TABS: 97.2000 mg | ORAL_TABLET | Freq: Every day | ORAL | 0 refills | Status: DC

## 2021-06-25 MED ORDER — CLONAZEPAM 1 MG PO TABS: ORAL_TABLET | ORAL | 0 refills | Status: DC

## 2021-06-25 NOTE — Telephone Encounter (Signed)
Pt has called very tearful requesting a refill on klonopin. She stated earlier today her special needs son found out he has a tumor and this afternoon her husband has had an MI. She stated that she can not handle the stress of everything going on and needs some help. She would like the refill sent today if PCP is able. Please advise.

## 2021-06-26 ENCOUNTER — Encounter: Payer: Self-pay | Admitting: Internal Medicine

## 2021-06-26 ENCOUNTER — Other Ambulatory Visit: Payer: Self-pay | Admitting: Internal Medicine

## 2021-06-26 DIAGNOSIS — G40309 Generalized idiopathic epilepsy and epileptic syndromes, not intractable, without status epilepticus: Secondary | ICD-10-CM

## 2021-06-26 DIAGNOSIS — F411 Generalized anxiety disorder: Secondary | ICD-10-CM

## 2021-06-26 MED ORDER — CLONAZEPAM 1 MG PO TABS: ORAL_TABLET | ORAL | 0 refills | Status: DC

## 2021-06-26 MED ORDER — PHENOBARBITAL 97.2 MG PO TABS: 97.2000 mg | ORAL_TABLET | Freq: Every day | ORAL | 0 refills | Status: DC

## 2021-06-26 NOTE — Telephone Encounter (Signed)
Patient checking status of pharmacy request  Patient states she is sitting in pharmacy parking lot waiting on rx   *see below*

## 2021-06-26 NOTE — Telephone Encounter (Signed)
Patient requesting klonopin refill sent to:   [id number] [place] [place]  Patient requesting a call back when refill has been sent  Encouraged patient to set up refill notifications with pharmacy

## 2021-06-26 NOTE — Telephone Encounter (Signed)
Pt is calling requesting that refill request be resubmitted NHR:VACQPEAKLT (KLONOPIN) 1 MG tablet and PHENobarbital (LUMINAL) 97.2 MG tablet.  [id number] [place], [place]  Pt is out of town and not sure when she will return.   Pt contact [phone].

## 2021-06-26 NOTE — Telephone Encounter (Signed)
Separate phone call in regard has been routed to PCP. Will "done" this duplicate request.

## 2021-06-27 NOTE — Telephone Encounter (Signed)
Pt has been informed Rx sent to pharmacy below

## 2021-07-22 ENCOUNTER — Other Ambulatory Visit: Payer: Self-pay | Admitting: Internal Medicine

## 2021-07-22 ENCOUNTER — Telehealth: Payer: Self-pay | Admitting: Internal Medicine

## 2021-07-22 DIAGNOSIS — F411 Generalized anxiety disorder: Secondary | ICD-10-CM

## 2021-07-22 DIAGNOSIS — G40309 Generalized idiopathic epilepsy and epileptic syndromes, not intractable, without status epilepticus: Secondary | ICD-10-CM

## 2021-07-22 MED ORDER — PHENOBARBITAL 97.2 MG PO TABS: 97.2000 mg | ORAL_TABLET | Freq: Every day | ORAL | 0 refills | Status: DC

## 2021-07-22 MED ORDER — CLONAZEPAM 1 MG PO TABS: ORAL_TABLET | ORAL | 0 refills | Status: DC

## 2021-07-22 NOTE — Telephone Encounter (Signed)
Patient forgot to add she also needs refill on clonazePAM (KLONOPIN) 1 MG tablet

## 2021-07-22 NOTE — Telephone Encounter (Signed)
1.Medication Requested: PHENobarbital (LUMINAL) 97.2 MG tablet  2. Pharmacy (Name, Street, [place]): [id number] [place] [place]  Phone:  [phone] Fax:  [phone]   3. On Med List: yes  4. Last Visit with PCP: 02.28.22  5. Next visit date with PCP: 03.13.23   Agent: Please be advised that RX refills may take up to 3 business days. We ask that you follow-up with your pharmacy.

## 2021-08-01 ENCOUNTER — Other Ambulatory Visit: Payer: Self-pay | Admitting: Internal Medicine

## 2021-08-01 DIAGNOSIS — F331 Major depressive disorder, recurrent, moderate: Secondary | ICD-10-CM

## 2021-08-01 DIAGNOSIS — F332 Major depressive disorder, recurrent severe without psychotic features: Secondary | ICD-10-CM

## 2021-08-02 ENCOUNTER — Encounter: Payer: Self-pay | Admitting: Family

## 2021-08-02 ENCOUNTER — Telehealth (INDEPENDENT_AMBULATORY_CARE_PROVIDER_SITE_OTHER): Payer: PPO | Admitting: Family

## 2021-08-02 VITALS — HR 80 | Temp 97.5°F

## 2021-08-02 DIAGNOSIS — U071 COVID-19: Secondary | ICD-10-CM | POA: Diagnosis not present

## 2021-08-02 MED ORDER — BENZONATATE 100 MG PO CAPS: 100.0000 mg | ORAL_CAPSULE | Freq: Three times a day (TID) | ORAL | 0 refills | Status: DC | PRN

## 2021-08-02 MED ORDER — MOLNUPIRAVIR EUA 200MG CAPSULE
4.0000 | ORAL_CAPSULE | Freq: Two times a day (BID) | ORAL | 0 refills | Status: DC
Start: 2021-08-02 — End: 2021-08-12

## 2021-08-02 NOTE — Progress Notes (Signed)
Brandi Ramsey is a 71 y.o. female with the following history as recorded in EpicCare:  Patient Active Problem List   Diagnosis Date Noted   Motion sickness 11/28/2020   Flank pain 09/17/2020   Hematuria 09/17/2020   Polyp of colon 08/20/2020   Left sided sciatica 08/20/2020   Hyperglycemia 08/20/2020   Severe episode of recurrent major depressive disorder, without psychotic features (Brandi Ramsey) 01/09/2020   Routine general medical examination at a health care facility 10/19/2019   Bilateral hearing loss 05/31/2018   Status post laparoscopic sleeve gastrectomy 02/24/2017   Visit for screening mammogram 11/18/2016   GAD (generalized anxiety disorder) 06/24/2016   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/2014   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     atorvastatin (LIPITOR) 80 MG tablet TAKE 1 TABLET BY MOUTH EVERY DAY 90 tablet 1   benzonatate (TESSALON) 100 MG capsule Take 1 capsule (100 mg total) by mouth 3 (three) times daily as needed. 20 capsule 0   buPROPion (WELLBUTRIN XL) 300 MG 24 hr tablet TAKE 1 TABLET BY MOUTH EVERY DAY 90 tablet 1   clonazePAM (KLONOPIN) 1 MG tablet TAKE 1 TABLET BY MOUTH TWICE A DAY AS NEEDED FOR ANXIETY 120 tablet 0   DULoxetine (CYMBALTA) 60 MG capsule TAKE 1 CAPSULE BY MOUTH EVERY DAY 90 capsule 1   molnupiravir EUA (LAGEVRIO) 200 mg CAPS capsule Take 4 capsules (800  mg total) by mouth 2 (two) times daily for 5 days. 40 capsule 0   pantoprazole (PROTONIX) 40 MG tablet TAKE 1 TABLET BY MOUTH TWICE A DAY 180 tablet 1   PHENobarbital (LUMINAL) 97.2 MG tablet Take 1 tablet (97.2 mg total) by mouth at bedtime. 90 tablet 0   VITAMIN D PO Take by mouth.     zinc gluconate 50 MG tablet Take 50 mg by mouth daily.     No current facility-administered medications for this visit.    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 Carotid US 3/15: mild soft plaque origin ICA. 1-39% ICA stenosis.   History of echocardiogram    a. Echo 3/15: Moderate LVH, focal basal hypertrophy, EF 60-65%, normal wall motion   History of nuclear stress test    a. Myoview 5/16: Normal perfusion, EF 78%, low risk   Hyperlipidemia    Hypertension    Obesity  Orthostatic hypotension 01/20/2019   Pneumonia    Post-operative nausea and vomiting    Seizures (HCC)    last=at age 28   Stroke ([place])    Transient cerebral ischemia    Vertigo, benign paroxysmal     Past Surgical History:  Procedure Laterality Date   ABDOMINAL HYSTERECTOMY     BREAST SURGERY     Bilateral mastectomy-TRAM   COLONOSCOPY  08/24/2015   [facility]   FRACTURE SURGERY     tib/fib (R) leg   LAPAROSCOPIC GASTRIC SLEEVE RESECTION  05/27/2016   MASTECTOMY      Family History  Problem Relation Age of Onset   Raynaud syndrome Mother    Colon polyps  Mother    Heart attack Mother    Alzheimer's disease Father    Diabetes Father    Colon polyps Father    Fragile X syndrome Ramsey        Patient is carrier   Diabetes Sister    Diabetes Brother    Cervical cancer Maternal Grandmother    Breast cancer Maternal Grandmother    Ovarian cancer Maternal Grandmother    Heart failure Maternal Grandfather    Diabetes Paternal Grandmother    Colon polyps Sister    Colon polyps Brother    Colon cancer Maternal Aunt    Esophageal cancer Neg Hx    Stomach cancer Neg Hx    Rectal cancer Neg Hx     Social History   Tobacco Use   Smoking status: Never   Smokeless tobacco: Never  Substance Use Topics   Alcohol use: No    Alcohol/week: 0.0 standard drinks    Subjective:    I connected with Brandi Ramsey on 08/02/21 at  3:20 PM EST by a phone call and verified that I am speaking with the correct person using two identifiers.   I discussed the limitations of evaluation and management by telemedicine and the availability of in person appointments. The patient expressed understanding and agreed to proceed.   Provider in office/ patient is at home; provider and patient are only 2 people on phone call.   Patient's Ramsey lives in a group home and she/ husband were visiting earlier this week; Ramsey's roommate tested positive for COVID earlier this week; patient started with cough/ congestion/ body aches yesterday- tested positive for COVID yesterday; denies any fever or shortness of breath; would like to discuss treatment options;    Objective:  Vitals:   08/02/21 1448  Pulse: 80  Temp: (!) 97.5 F (36.4 C)    Lungs: Respirations unlabored;  Neurologic: Alert and oriented; speech intact;   Assessment:  1. COVID-19     Plan:  Rx for Molnupiravir- take as directed; Rx for [employer]; increase fluids, rest and follow up in person with her PCP if symptoms persist.   Time spent 15 minutes  No follow-ups on file.  No orders of the defined  types were placed in this encounter.   Requested Prescriptions   Signed Prescriptions Disp Refills   molnupiravir EUA (LAGEVRIO) 200 mg CAPS capsule 40 capsule 0    Sig: Take 4 capsules (800 mg total) by mouth 2 (two) times daily for 5 days.   benzonatate (TESSALON) 100 MG capsule 20 capsule 0    Sig: Take 1 capsule (100 mg total) by mouth 3 (three) times daily as needed.

## 2021-08-10 ENCOUNTER — Other Ambulatory Visit: Payer: Self-pay | Admitting: Internal Medicine

## 2021-08-10 ENCOUNTER — Other Ambulatory Visit: Payer: Self-pay | Admitting: Family

## 2021-08-10 DIAGNOSIS — K2100 Gastro-esophageal reflux disease with esophagitis, without bleeding: Secondary | ICD-10-CM

## 2021-09-02 ENCOUNTER — Ambulatory Visit: Payer: PPO | Admitting: Internal Medicine

## 2021-09-22 ENCOUNTER — Other Ambulatory Visit: Payer: Self-pay | Admitting: Internal Medicine

## 2021-09-22 DIAGNOSIS — F411 Generalized anxiety disorder: Secondary | ICD-10-CM

## 2021-10-08 ENCOUNTER — Other Ambulatory Visit: Payer: Self-pay | Admitting: Internal Medicine

## 2021-10-08 ENCOUNTER — Ambulatory Visit: Payer: PPO | Admitting: Internal Medicine

## 2021-10-08 ENCOUNTER — Ambulatory Visit: Payer: PPO

## 2021-10-08 DIAGNOSIS — F331 Major depressive disorder, recurrent, moderate: Secondary | ICD-10-CM

## 2021-10-16 ENCOUNTER — Other Ambulatory Visit: Payer: Self-pay | Admitting: Internal Medicine

## 2021-10-16 DIAGNOSIS — E785 Hyperlipidemia, unspecified: Secondary | ICD-10-CM

## 2021-10-20 ENCOUNTER — Other Ambulatory Visit: Payer: Self-pay | Admitting: Internal Medicine

## 2021-10-20 DIAGNOSIS — G40309 Generalized idiopathic epilepsy and epileptic syndromes, not intractable, without status epilepticus: Secondary | ICD-10-CM

## 2021-10-28 ENCOUNTER — Other Ambulatory Visit: Payer: Self-pay | Admitting: Internal Medicine

## 2021-10-28 DIAGNOSIS — F411 Generalized anxiety disorder: Secondary | ICD-10-CM

## 2021-10-28 DIAGNOSIS — G40309 Generalized idiopathic epilepsy and epileptic syndromes, not intractable, without status epilepticus: Secondary | ICD-10-CM

## 2021-10-28 MED ORDER — PHENOBARBITAL 97.2 MG PO TABS: 97.2000 mg | ORAL_TABLET | Freq: Every day | ORAL | 0 refills | Status: AC

## 2021-10-28 MED ORDER — CLONAZEPAM 1 MG PO TABS: 1.0000 mg | ORAL_TABLET | Freq: Two times a day (BID) | ORAL | 0 refills | Status: AC

## 2021-11-13 ENCOUNTER — Ambulatory Visit: Payer: PPO | Admitting: Internal Medicine

## 2021-12-16 ENCOUNTER — Ambulatory Visit: Payer: PPO | Admitting: Internal Medicine

## 2022-01-19 ENCOUNTER — Other Ambulatory Visit: Payer: Self-pay | Admitting: Internal Medicine

## 2022-01-19 DIAGNOSIS — K2100 Gastro-esophageal reflux disease with esophagitis, without bleeding: Secondary | ICD-10-CM

## 2022-01-19 DIAGNOSIS — F332 Major depressive disorder, recurrent severe without psychotic features: Secondary | ICD-10-CM

## 2022-01-19 DIAGNOSIS — F331 Major depressive disorder, recurrent, moderate: Secondary | ICD-10-CM

## 2022-03-03 ENCOUNTER — Encounter: Payer: Self-pay | Admitting: Neurology

## 2022-03-17 ENCOUNTER — Telehealth: Payer: Self-pay | Admitting: Internal Medicine

## 2022-03-17 NOTE — Telephone Encounter (Signed)
FYI: Patient moved to [employer].

## 2022-04-11 ENCOUNTER — Ambulatory Visit (INDEPENDENT_AMBULATORY_CARE_PROVIDER_SITE_OTHER): Payer: Medicare HMO | Admitting: Psychology

## 2022-04-11 DIAGNOSIS — F411 Generalized anxiety disorder: Secondary | ICD-10-CM | POA: Diagnosis not present

## 2022-04-11 DIAGNOSIS — F331 Major depressive disorder, recurrent, moderate: Secondary | ICD-10-CM | POA: Diagnosis not present

## 2022-04-11 NOTE — Progress Notes (Signed)
                Burnice Vassel G Khadija Thier, LCSW

## 2022-04-15 ENCOUNTER — Ambulatory Visit (INDEPENDENT_AMBULATORY_CARE_PROVIDER_SITE_OTHER): Payer: Medicare HMO | Admitting: Psychology

## 2022-04-15 DIAGNOSIS — F331 Major depressive disorder, recurrent, moderate: Secondary | ICD-10-CM

## 2022-04-15 DIAGNOSIS — F411 Generalized anxiety disorder: Secondary | ICD-10-CM | POA: Diagnosis not present

## 2022-04-15 NOTE — Progress Notes (Addendum)
[place] Counselor/Therapist Progress Note  Patient ID: Brandi Ramsey, MRN: [id number],    Date: 04/15/2022  Time Spent: 60 minutes  Treatment Type: Individual Therapy  Reported Symptoms: crying spells, depression, anxiety  Mental Status Exam: Appearance:  Casual     Behavior: Appropriate  Motor: Normal  Speech/Language:  Clear and Coherent  Affect: Blunt  Mood: depressed  Thought process: normal  Thought content:   WNL  Sensory/Perceptual disturbances:   WNL  Orientation: oriented to person, place, time/date, and situation  Attention: Good  Concentration: Good  Memory: WNL  Fund of knowledge:  Good  Insight:   Good  Judgment:  Good  Impulse Control: Good   Risk Assessment: Danger to Self:  No Self-injurious Behavior: No Danger to Others: No Duty to Warn:no Physical Aggression / Violence:No  Access to Firearms a concern: No  Gang Involvement:No   Subjective: The patient attended an individual therapy session via video visit.  The patient gave verbal consent for the session to be on caregility.  The patient was in her home alone and the therapist was in the office.  The patient had make-up on today and was pleasant and cooperative.  She still gets tearful at times during the sessions but she reports that she felt better after we talked the last time.  The patient is struggling because she is continuing to take care of her developmentally delayed son who is 70 years old and has been diagnosed with Alzheimer's.  She reports that she and her husband went to see him yesterday.  They are having some difficulties with finances and being able to afford to take him out and they go see him.  The patient reports that she is happy to be able to be there and visit with him.  She did state that the institution where he is at his estate run facility and they are considering moving him to [place] and she is really against this right now.  We talked about if this happens  that she would handle the situation and we talked about how she and her husband might be able to manage.  We talked briefly about the situation that happened with the house last December and it seems that there should have been some legal action that they could have taken with the circumstance.  We decided to discuss this more at our next session. Interventions: CBT, insight oriented, Problem solving, assertivness and communication  Diagnosis:Major depressive disorder, recurrent episode, moderate ([place])  Generalized anxiety disorder  Plan: Plan of Care: Client Abilities/Strengths  Intelligent, ability for insight, motivated  Client Treatment Preferences  Outpatient Individual therapy  Client Statement of Needs  "I am depressed, I cry every day"  Treatment Level  Outpatient Individual therapy  Symptoms  Depressed or irritable mood.:  (Status: maintained). Diminished interest in or  enjoyment of activities.:  (Status: maintained). Feelings of hopelessness,  worthlessness, or inappropriate guilt (Status: maintained). Lack of energy.:  (Status:maintained). Poor concentration and indecisiveness.:  (Status: maintained). Sleeplessness or hypersomnia.:  (Status:  maintained).  Problems Addressed  Unipolar Depression, Unipolar Depression  Goals 1. Alleviate depressive symptoms and return to previous level of effective  functioning. Objective Describe current and past experiences with depression including their impact on functioning and  attempts to resolve it. Target Date: 04/12/2023 Frequency: Biweekly Progress: 0 Modality: individual Related Interventions 1. Encourage the client to share his/her thoughts and feelings of depression; express empathy and  build rapport while identifying primary cognitive,  behavioral, interpersonal, or other  contributors to depression. Objective Identify and replace thoughts and beliefs that support depression. Target Date: 04/12/2023 Frequency:  Biweekly Progress: 0 Modality: individual Related Interventions 1. Facilitate and reinforce the client's shift from biased depressive self-talk and beliefs to realitybased cognitive messages that enhance self-confidence and increase adaptive actions (see  "Positive Self-Talk" in the Adult Psychotherapy Homework Planner by Bryn Gulling). Objective Learn and implement behavioral strategies to overcome depression. Target Date: 04/12/2023 Frequency: Biweekly Progress: 0 Modality: individual Related Interventions 1. Assist the client in developing skills that increase the likelihood of deriving pleasure from  behavioral activation (e.g., assertiveness skills, developing an exercise plan, less internal/more  external focus, increased social involvement); reinforce success. 2. Develop healthy thinking patterns and beliefs about self, others, and the world that lead to the alleviation and help prevent the relapse of  depression. Diagnosis 296.32 (Major depressive affective disorder, recurrent episode, moderate) - Open -  [Signifier: n/a]  F41.1  Generalized Anxiety Disorder Conditions For Discharge Achievement of treatment goals and objectives  Ami Thornsberry G Samiya Mervin, LCSW

## 2022-04-24 ENCOUNTER — Ambulatory Visit: Payer: Medicare HMO | Admitting: Psychology

## 2022-04-29 ENCOUNTER — Telehealth: Payer: Self-pay | Admitting: Internal Medicine

## 2022-04-29 NOTE — Telephone Encounter (Signed)
Called patient to schedule Medicare Annual Wellness Visit, patient states she as moved out of state and is no longer seeing Dr. Ronnald Ramp.

## 2022-06-06 NOTE — Progress Notes (Deleted)
NEUROLOGY CONSULTATION NOTE  Brandi Ramsey MRN: [id number] DOB: 22-May-1951  Referring provider: Mariann Barter, MD Primary care provider: Mariann Barter, MD  Reason for consult:  FXTAS, headache, seizure disorder  Assessment/Plan:   1.  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 2.  Depression and anxiety 3.  Tension type headache, not intractable 4.  Migraine with aura, without status migrainosus, not intractable 5.  History of generalized epilepsy  1.  Phenobarbital 97.'5mg'$  at bedtime.   2.  Calcium '500mg'$  twice daily and vitamin D 600 IU twice daily 3.  Cymbalta '60mg'$  daily for headache prophylaxis 4.  Limit use of pain relievers to no more than 2 days out of week to prevent risk of rebound or medication-overuse headache. 5.  ***  Subjective:  Brandi Ramsey is a 71 year old woman with hypercholesterolemia, type 2 diabetes mellitus, migraine, depression and remote history of seizure disorder, concussion and TIA who follows up for fragile X tremor-associated syndrome, tension-type headache.   Her son has fragile X syndrome with severe autism.  About 22 years ago, she began experiencing dizzy spells.  She apparently had imaging of the head which was suspicious for possible multiple sclerosis.  She had lumbar puncture, which was negative.  About 10 years ago, she developed mild tremor and unsteadiness.  She was worked up by Dr. Hazle Nordmann, a fragile X specialist at [facility], who diagnosed her with fragile X-associated tremor ataxia syndrome.  Work up included genetic testing for FMR1 gene premutation.    She has had a progressive course over the years.  She will have dizzy spells, described as lightheadedness, which will cause falls.  It lasts a few seconds.  She has never actually lost consciousness.  She has fractured her wrists and leg.  She also has tremor of the hands, causing difficulty with writing.  She also developed dysphonia and was found to have  tremor involving her "voice box".  She reports prior episodes of both bowel and bladder incontinence, which are rare.  She also reports poor depth perception.  For example, she may sometimes drive too close to the curb or she may accidentally bump into somebody when walking through the grocery store aisle.  She reports increased fatigue and weight gain.  She has depression and mood swings.  She also reports short-term memory problems.  She also has problems with swallowing and has choked on some occasions, requiring the Heimlich maneuver.   She has a remote history of epilepsy.  She had convulsions as a baby and two generalized tonic-clonic seizures in her early twenties.  She has been on phenobarbital ever since.  No recurrent seizures.   A DEXA scan was performed in 2016 and was negative.  She continues on phenobarbital 97.'5mg'$  at bedtime.   She also has history of migraine, described as severe pressure and throbbing, over her left eye.  In March 2015, she had such a headache associated with right sided facial numbness and lower facial weakness.  She was admitted to [facility] for TIA.  CT of the head was unremarkable.  MRI of the brain showed periventricular and subcortical hyperintensities.  MRA of the head showed no major intracranial arterial stenosis, however there is question of mild narrowing of the left cavernous ICA.  Carotid duplex showed 1-39% bilateral ICA stenosis.  2D echo showed LVEF of 60-65% with moderate LVH.     She suffered postconcussion syndrome since slipping and hitting the back of her head in August 2016.  She says she passed afterwards for a few seconds.  Afterwards, she noted headache, spinning sensation, nausea and she vomited.  She went to the ED where CT of the head and cervical spine showed no acute findings.  She was discharged home with meclizine and hydrocodone.  Since she has been home, she still notes constant dull posterior headache.  She takes ibuprofen for it daily but  not the hydrocodone.  The dizziness is improving.  She has been more emotional and was crying easily.  She is seeing Dr. Hulan Saas of Sports Medicine for management.   Due to concerns of possible prior history of MS, she underwent MRI of the brain and cervical spine with and without contrast on 03/15/15.  There was slight progression of nonspecific cerebral white matter changes, most probably chronic small vessel disease, compared to 2012.  Cervical spine showed no cord lesions.     Current medications:  Phenobarbital 97.'5mg'$  at bedtime (seizure prophylaxis); Calcium '500mg'$  twice daily and vitamin D 600 IU twice daily; Cymbalta '60mg'$  daily (headache prophylaxis, increased by Dr. 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 Carotid US 3/15: mild soft plaque origin ICA. 1-39% ICA stenosis.   History of echocardiogram    a. Echo 3/15: Moderate LVH, focal basal hypertrophy, EF 60-65%, normal wall motion   History of nuclear stress test    a. Myoview 5/16: Normal perfusion, EF 78%, low risk   Hyperlipidemia    Hypertension    Obesity    Orthostatic hypotension 01/20/2019   Pneumonia    Post-operative nausea and vomiting    Seizures (HCC)    last=at age 53   Stroke ([place])    Transient cerebral ischemia    Vertigo, benign paroxysmal     PAST SURGICAL HISTORY: Past Surgical History:  Procedure Laterality Date   ABDOMINAL HYSTERECTOMY     BREAST SURGERY     Bilateral mastectomy-TRAM   COLONOSCOPY  08/24/2015   [facility] [facility]   FRACTURE SURGERY     tib/fib (R) leg   LAPAROSCOPIC GASTRIC SLEEVE RESECTION  05/27/2016    MASTECTOMY      MEDICATIONS: Current Outpatient Medications on File Prior to Visit  Medication Sig Dispense Refill   Ascorbic Acid (VITAMIN C PO) Take by mouth.     atorvastatin (LIPITOR) 80 MG tablet TAKE 1 TABLET BY MOUTH EVERY DAY 90 tablet 1   buPROPion (WELLBUTRIN XL) 300 MG 24 hr tablet TAKE 1 TABLET BY MOUTH EVERY DAY 90 tablet 1   clonazePAM (KLONOPIN) 1 MG tablet Take 1 tablet (1 mg total) by mouth 2 (two) times daily. 120 tablet 0   DULoxetine (CYMBALTA) 60 MG capsule TAKE 1 CAPSULE BY MOUTH EVERY DAY 90 capsule 1   pantoprazole (PROTONIX) 40 MG tablet TAKE 1 TABLET BY MOUTH TWICE A DAY 180 tablet 1   PHENobarbital (LUMINAL) 97.2 MG tablet Take 1 tablet (97.2 mg total) by mouth at bedtime. 90 tablet 0   VITAMIN D PO Take by mouth.     zinc  gluconate 50 MG tablet Take 50 mg by mouth daily.     No current facility-administered medications on file prior to visit.    ALLERGIES: Allergies  Allergen Reactions   Buprenorphine Hcl Hives and Itching   Hydromorphone     Could not focus, "Seeing scary things."  Noises louder. Altered mental status   Meperidine Hives and Itching    Other reaction(s): HIVES,ITCHING   Metformin And Related Diarrhea   Morphine And Related Hives and Itching    States only when mixed with Demerol   Pentazocine Anaphylaxis   Pentazocine Lactate Anaphylaxis   Phenytoin Hives    Other reaction(s): RASH   Demerol Hives and Itching   Dilaudid [Hydromorphone Hcl]     Hallucinations and felt crazy    Doxycycline Hyclate     Other reaction(s): GI UPSET,NAUSEA,VOMITING   Ibuprofen     Contraindicated for risk of GI bleeding.   Phenytoin Sodium Extended Hives   Codeine Rash and Nausea Only    When mixed with Demerol Patient reports no reaction with Codeine/APAP (Tylenol #3)   Doxycycline Hyclate Nausea And Vomiting    Other reaction(s): GI UPSET,NAUSEA,VOMITING Other reaction(s): GI UPSET,NAUSEA,VOMITING    FAMILY HISTORY: Family History  Problem  Relation Age of Onset   Raynaud syndrome Mother    Colon polyps Mother    Heart attack Mother    Alzheimer's disease Father    Diabetes Father    Colon polyps Father    Fragile X syndrome Son        Patient is carrier   Diabetes Sister    Diabetes Brother    Cervical cancer Maternal Grandmother    Breast cancer Maternal Grandmother    Ovarian cancer Maternal Grandmother    Heart failure Maternal Grandfather    Diabetes Paternal Grandmother    Colon polyps Sister    Colon polyps Brother    Colon cancer Maternal Aunt    Esophageal cancer Neg Hx    Stomach cancer Neg Hx    Rectal cancer Neg Hx     Objective:  *** General: No acute distress.  Patient appears well-groomed.   Head:  Normocephalic/atraumatic Eyes:  fundi examined but not visualized Neck: supple, no paraspinal tenderness, full range of motion Back: No paraspinal tenderness Heart: regular rate and rhythm Lungs: Clear to auscultation bilaterally. Vascular: No carotid bruits. Neurological Exam: Mental status: alert and oriented to person, place, and time, speech fluent and not dysarthric, language intact. Cranial nerves: CN I: not tested CN II: pupils equal, round and reactive to light, visual fields intact CN III, IV, VI:  full range of motion, no nystagmus, no ptosis CN V: facial sensation intact. CN VII: upper and lower face symmetric CN VIII: hearing intact CN IX, X: gag intact, uvula midline CN XI: sternocleidomastoid and trapezius muscles intact CN XII: tongue midline Bulk & Tone: normal, no fasciculations. Motor:  muscle strength 5/5 throughout Sensation:  Pinprick, temperature and vibratory sensation intact. Deep Tendon Reflexes:  2+ throughout,  toes downgoing.   Finger to nose testing:  Without dysmetria.   Heel to shin:  Without dysmetria.   Gait:  Normal station and stride.  Romberg negative.    Thank you for allowing me to take part in the care of this patient.  Metta Clines, DO  CC: Scarlette Calico, MD

## 2022-06-09 ENCOUNTER — Ambulatory Visit: Payer: Medicare HMO | Admitting: Neurology

## 2023-03-18 ENCOUNTER — Ambulatory Visit: Payer: Medicare Other | Admitting: Psychology

## 2023-03-18 DIAGNOSIS — F411 Generalized anxiety disorder: Secondary | ICD-10-CM | POA: Diagnosis not present

## 2023-03-18 DIAGNOSIS — F331 Major depressive disorder, recurrent, moderate: Secondary | ICD-10-CM | POA: Diagnosis not present

## 2023-03-18 NOTE — Progress Notes (Unsigned)
                Verna Hamon G Madaline Lefeber, LCSW

## 2023-03-24 ENCOUNTER — Ambulatory Visit (INDEPENDENT_AMBULATORY_CARE_PROVIDER_SITE_OTHER): Payer: Medicare Other | Admitting: Psychology

## 2023-03-24 DIAGNOSIS — F331 Major depressive disorder, recurrent, moderate: Secondary | ICD-10-CM

## 2023-03-24 DIAGNOSIS — F411 Generalized anxiety disorder: Secondary | ICD-10-CM | POA: Diagnosis not present

## 2023-03-24 NOTE — Progress Notes (Addendum)
[facility] Counselor/Therapist Progress Note  Patient ID: MEGAHN KILLINGS, MRN: [id number],    Date: 03/24/2023  Time Spent: 63 minutes  Time in:  4:00 Time out:  5:03  Treatment Type: Individual Therapy  Reported Symptoms: crying spells, sadness, anxiety  Mental Status Exam: Appearance:  Casual     Behavior: Appropriate  Motor: Normal  Speech/Language:  Clear and Coherent  Affect: Depressed  Mood: depressed  Thought process: normal  Thought content:   WNL  Sensory/Perceptual disturbances:   WNL  Orientation: oriented to person, place, time/date, and situation  Attention: Good  Concentration: Good  Memory: WNL  Fund of knowledge:  Good  Insight:   Good  Judgment:  Good  Impulse Control: Good   Risk Assessment: Danger to Self:  No Self-injurious Behavior: No Danger to Others: No Duty to Warn:no Physical Aggression / Violence:No  Access to Firearms a concern: No  Gang Involvement:No   Subjective: The patient attended an individual therapy session via video visit.  The patient gave verbal consent for the session to be on caregility and she is aware of the limitations of telehealth.  The patient was in her home alone and the therapist was in the office.  The patient was crying and depressed for most of the session however, she was able to settle down by the time we finished the appointment.  The patient reports that she went to see her son who is at [facility] and apparently he had 3 outbursts in the last little bit and this is very concerning for her because it seems that the doctor is probably going to be diagnosing him with Alzheimer's at 72 years old.  The patient was very distraught about this and not getting the support that she was from her other son.  The patient is aging herself and feels that she is concerned about who will take care of of Mechele Collin when she and her husband passed away.  She talked about carrying the wheel around in her purse and we talked about  her needing to go ahead and get that updated and have a conversation with her ex daughter-in-law in order to make sure that she has some peace of mind about what will happen to her son Mechele Collin.  We made another appointment for next week and the patient is aware that she can contact me if she needs to.  We are going to talk about medications next week.   Interventions: Cognitive Behavioral Therapy, Assertiveness/Communication, Insight-Oriented, and Interpersonal  Diagnosis:Major depressive disorder, recurrent episode, moderate (HCC)  Generalized anxiety disorder  Plan: Plan of Care: Client Abilities/Strengths  Intelligent, ability for insight, motivated  Client Treatment Preferences  Outpatient Individual therapy  Client Statement of Needs  "I am depressed, I cry every day"  Treatment Level  Outpatient Individual therapy  Symptoms  Depressed or irritable mood.:  (Status: maintained). Diminished interest in or  enjoyment of activities.:  (Status: maintained). Feelings of hopelessness,  worthlessness, or inappropriate guilt (Status: maintained). Lack of energy.:  (Status:maintained). Poor concentration and indecisiveness.:  (Status: maintained). Sleeplessness or hypersomnia.:  (Status:  maintained).  Problems Addressed  Unipolar Depression, Unipolar Depression  Goals 1. Alleviate depressive symptoms and return to previous level of effective  functioning. Objective Describe current and past experiences with depression including their impact on functioning and  attempts to resolve it. Target Date: 04/11/2024 Frequency: Biweekly Progress: 0 Modality: individual Related Interventions 1. Encourage the client to share his/her thoughts and feelings of depression; express empathy  and  build rapport while identifying primary cognitive, behavioral, interpersonal, or other  contributors to depression. Objective Identify and replace thoughts and beliefs that support depression. Target Date:  04/11/2024 Frequency: Biweekly Progress: 0 Modality: individual Related Interventions 1. Facilitate and reinforce the client's shift from biased depressive self-talk and beliefs to realitybased cognitive messages that enhance self-confidence and increase adaptive actions (see  "Positive Self-Talk" in the Adult Psychotherapy Homework Planner by Stephannie Li). Objective Learn and implement behavioral strategies to overcome depression. Target Date: 04/11/2024 Frequency: Biweekly Progress: 0 Modality: individual Related Interventions 1. Assist the client in developing skills that increase the likelihood of deriving pleasure from  behavioral activation (e.g., assertiveness skills, developing an exercise plan, less internal/more  external focus, increased social involvement); reinforce success. 2. Develop healthy thinking patterns and beliefs about self, others, and the world that lead to the alleviation and help prevent the relapse of  depression. Diagnosis 296.32 (Major depressive affective disorder, recurrent episode, moderate) - Open -  [Signifier: n/a]  F41.1  Generalized Anxiety Disorder Conditions For Discharge Achievement of treatment goals and objectives  Leyland Kenna G Duy Lemming, LCSW

## 2023-04-02 ENCOUNTER — Ambulatory Visit (INDEPENDENT_AMBULATORY_CARE_PROVIDER_SITE_OTHER): Payer: Medicare Other | Admitting: Psychology

## 2023-04-02 DIAGNOSIS — F411 Generalized anxiety disorder: Secondary | ICD-10-CM

## 2023-04-02 DIAGNOSIS — F331 Major depressive disorder, recurrent, moderate: Secondary | ICD-10-CM

## 2023-04-02 NOTE — Progress Notes (Signed)
[facility] Counselor/Therapist Progress Note  Patient ID: Brandi Ramsey, MRN: [id number],    Date: 04/02/2023  Time Spent: 60 minutes  Time in:  12:00 Time out:  1:00  Treatment Type: Individual Therapy  Reported Symptoms: crying spells, sadness, anxiety  Mental Status Exam: Appearance:  Casual     Behavior: Appropriate  Motor: Normal  Speech/Language:  Clear and Coherent  Affect: Depressed  Mood: depressed  Thought process: normal  Thought content:   WNL  Sensory/Perceptual disturbances:   WNL  Orientation: oriented to person, place, time/date, and situation  Attention: Good  Concentration: Good  Memory: WNL  Fund of knowledge:  Good  Insight:   Good  Judgment:  Good  Impulse Control: Good   Risk Assessment: Danger to Self:  No Self-injurious Behavior: No Danger to Others: No Duty to Warn:no Physical Aggression / Violence:No  Access to Firearms a concern: No  Gang Involvement:No   Subjective: The patient attended an individual therapy session via video visit.  The patient gave verbal consent for the session to be on caregility and she is aware of the limitations of telehealth.  The patient was in her home alone and the therapist was in the office.  The patient continues to present as depressed and cries frequently during our session.  She reports that she has not been sleeping well and she got up and started journaling last night.  We talked again about the situation with her son and she has some concerns because her grandson is having a concert and he is afraid that his father will get upset because his grandmother is there at the concert.  The patient's grandson's mother invited them to come but did ask them not to do it with her.  We talked about how to handle that situation and I encouraged her to go if that is what she wanted to do and to just respect the boundaries that her grandson's mother set.  The patient does not understand why her son is reacting  this way and I explained to her again that I felt like he was reacting this way probably because of the money that he get by being involved in the relationship that he began.  We talked about how to reframe it so that she can just let that part go and not get so sad about it.  She tends to revert back to old ways of thinking and is trying to figure out why he is doing what he is doing.  I explained to her that he is doing what he is doing because it is a choice and that she is not going to be able to figure out what he thinks.  I also spoke with the patient about contacting a psychiatrist for a psychiatric nurse practitioner in the area and getting her medication looked that as she has been on 2 medications for quite some time and they do not seem to be working.  Interventions: Cognitive Behavioral Therapy, Assertiveness/Communication, Insight-Oriented, and Interpersonal  Diagnosis:Major depressive disorder, recurrent episode, moderate (HCC)  Generalized anxiety disorder  Plan: Plan of Care: Client Abilities/Strengths  Intelligent, ability for insight, motivated  Client Treatment Preferences  Outpatient Individual therapy  Client Statement of Needs  "I am depressed, I cry every day"  Treatment Level  Outpatient Individual therapy  Symptoms  Depressed or irritable mood.:  (Status: maintained). Diminished interest in or  enjoyment of activities.:  (Status: maintained). Feelings of hopelessness,  worthlessness, or inappropriate guilt (  Status: maintained). Lack of energy.:  (Status:maintained). Poor concentration and indecisiveness.:  (Status: maintained). Sleeplessness or hypersomnia.:  (Status:  maintained).  Problems Addressed  Unipolar Depression, Unipolar Depression  Goals 1. Alleviate depressive symptoms and return to previous level of effective  functioning. Objective Describe current and past experiences with depression including their impact on functioning and  attempts to resolve  it. Target Date: 04/11/2024 Frequency: Biweekly Progress: 10 Modality: individual Related Interventions 1. Encourage the client to share his/her thoughts and feelings of depression; express empathy and  build rapport while identifying primary cognitive, behavioral, interpersonal, or other  contributors to depression. Objective Identify and replace thoughts and beliefs that support depression. Target Date: 04/11/2024 Frequency: Biweekly Progress: 10 Modality: individual Related Interventions 1. Facilitate and reinforce the client's shift from biased depressive self-talk and beliefs to realitybased cognitive messages that enhance self-confidence and increase adaptive actions (see  "Positive Self-Talk" in the Adult Psychotherapy Homework Planner by Stephannie Li). Objective Learn and implement behavioral strategies to overcome depression. Target Date: 04/11/2024 Frequency: Biweekly Progress: On this, 0 Modality: individual Related Interventions 1. Assist the client in developing skills that increase the likelihood of deriving pleasure from  behavioral activation (e.g., assertiveness skills, developing an exercise plan, less internal/more  external focus, increased social involvement); reinforce success. 2. Develop healthy thinking patterns and beliefs about self, others, and the world that lead to the alleviation and help prevent the relapse of  depression. Diagnosis 296.32 (Major depressive affective disorder, recurrent episode, moderate) - Open -  [Signifier: n/a]  F41.1  Generalized Anxiety Disorder Conditions For Discharge Achievement of treatment goals and objectives  Spyridon Hornstein G Almas Rake, LCSW

## 2023-04-07 ENCOUNTER — Ambulatory Visit (INDEPENDENT_AMBULATORY_CARE_PROVIDER_SITE_OTHER): Payer: Medicare Other | Admitting: Psychology

## 2023-04-07 DIAGNOSIS — F411 Generalized anxiety disorder: Secondary | ICD-10-CM | POA: Diagnosis not present

## 2023-04-07 DIAGNOSIS — F331 Major depressive disorder, recurrent, moderate: Secondary | ICD-10-CM | POA: Diagnosis not present

## 2023-04-07 NOTE — Progress Notes (Signed)
[facility] Counselor/Therapist Progress Note  Patient ID: Brandi Ramsey, MRN: [id number],    Date: 04/07/2023  Time Spent: 60 minutes  Time in:  10:00 Time out:  11:00  Treatment Type: Individual Therapy  Reported Symptoms: crying spells, sadness, anxiety  Mental Status Exam: Appearance:  Casual     Behavior: Appropriate  Motor: Normal  Speech/Language:  Clear and Coherent  Affect: Depressed  Mood: depressed  Thought process: normal  Thought content:   WNL  Sensory/Perceptual disturbances:   WNL  Orientation: oriented to person, place, time/date, and situation  Attention: Good  Concentration: Good  Memory: WNL  Fund of knowledge:  Good  Insight:   Good  Judgment:  Good  Impulse Control: Good   Risk Assessment: Danger to Self:  No Self-injurious Behavior: No Danger to Others: No Duty to Warn:no Physical Aggression / Violence:No  Access to Firearms a concern: No  Gang Involvement:No   Subjective: The patient attended an individual therapy session via video visit.  The patient gave verbal consent for the session to be on caregility and she is aware of the limitations of telehealth.  The patient was in her home alone and the therapist was in the office.  The patient reports that she is going to speak with her son and psychiatrist to see if he can work with her medications.  That he would ask him if he could possibly manage them or if he could recommend someone who could.  We talked about her sadness about her son possibly having Alzheimer's.  We did some reframing around that and talked about the need to try to deal with it as it comes.  She states that her husband is not interested in knowing at this point in time whether he has Alzheimer's or not.  I explained that she probably is going to have to wait until he can get to the place where he can acknowledge that there are some issues.  The patient was very sad and tearful about her son Mechele Collin and I explained to  her that I thought it might be better for her them to try to find out so that they at least know what they are dealing with moving forward.  We talked about the need for her to stay in the present and to focus on neutral or positive things as much as possible.  I explained to her that the reason that I wanted her to do that was that it would help her deal with things better but she also is likely to need a medication adjustment from her psychiatrist.  Scheduled another appointment for next week and we will continue to work with the patient on CBT and increasing her coping strategies. Interventions: Cognitive Behavioral Therapy, Assertiveness/Communication, Insight-Oriented, and Interpersonal  Diagnosis:Major depressive disorder, recurrent episode, moderate (HCC)  Generalized anxiety disorder  Plan: Plan of Care: Client Abilities/Strengths  Intelligent, ability for insight, motivated  Client Treatment Preferences  Outpatient Individual therapy  Client Statement of Needs  "I am depressed, I cry every day"  Treatment Level  Outpatient Individual therapy  Symptoms  Depressed or irritable mood.:  (Status: maintained). Diminished interest in or  enjoyment of activities.:  (Status: maintained). Feelings of hopelessness,  worthlessness, or inappropriate guilt (Status: maintained). Lack of energy.:  (Status:maintained). Poor concentration and indecisiveness.:  (Status: maintained). Sleeplessness or hypersomnia.:  (Status:  maintained).  Problems Addressed  Unipolar Depression, Unipolar Depression  Goals 1. Alleviate depressive symptoms and return to previous level of effective  functioning. Objective Describe current and past experiences with depression including their impact on functioning and  attempts to resolve it. Target Date: 04/11/2024 Frequency: Biweekly Progress: 10 Modality: individual Related Interventions 1. Encourage the client to share his/her thoughts and feelings of depression;  express empathy and  build rapport while identifying primary cognitive, behavioral, interpersonal, or other  contributors to depression. Objective Identify and replace thoughts and beliefs that support depression. Target Date: 04/11/2024 Frequency: Biweekly Progress: 10 Modality: individual Related Interventions 1. Facilitate and reinforce the client's shift from biased depressive self-talk and beliefs to realitybased cognitive messages that enhance self-confidence and increase adaptive actions (see  "Positive Self-Talk" in the Adult Psychotherapy Homework Planner by Stephannie Li). Objective Learn and implement behavioral strategies to overcome depression. Target Date: 04/11/2024 Frequency: Biweekly Progress: On this, 10 Modality: individual Related Interventions 1. Assist the client in developing skills that increase the likelihood of deriving pleasure from  behavioral activation (e.g., assertiveness skills, developing an exercise plan, less internal/more  external focus, increased social involvement); reinforce success. 2. Develop healthy thinking patterns and beliefs about self, others, and the world that lead to the alleviation and help prevent the relapse of  depression. Diagnosis 296.32 (Major depressive affective disorder, recurrent episode, moderate) - Open -  [Signifier: n/a]  F41.1  Generalized Anxiety Disorder Conditions For Discharge Achievement of treatment goals and objectives  Arlys Scatena G Kalene Cutler, LCSW

## 2023-04-16 ENCOUNTER — Ambulatory Visit: Payer: Medicare Other | Admitting: Psychology

## 2023-04-16 DIAGNOSIS — F331 Major depressive disorder, recurrent, moderate: Secondary | ICD-10-CM | POA: Diagnosis not present

## 2023-04-16 DIAGNOSIS — F411 Generalized anxiety disorder: Secondary | ICD-10-CM

## 2023-04-16 NOTE — Progress Notes (Signed)
[facility] Counselor/Therapist Progress Note  Patient ID: Brandi Ramsey, MRN: [id number],    Date: 04/16/2023  Time Spent: 66 minutes  Time in:  5:00 Time out:  6:06  Treatment Type: Individual Therapy  Reported Symptoms: crying spells, sadness, anxiety  Mental Status Exam: Appearance:  Casual     Behavior: Appropriate  Motor: Normal  Speech/Language:  Clear and Coherent  Affect: Blunted  Mood: Pleasant  Thought process: normal  Thought content:   WNL  Sensory/Perceptual disturbances:   WNL  Orientation: oriented to person, place, time/date, and situation  Attention: Good  Concentration: Good  Memory: WNL  Fund of knowledge:  Good  Insight:   Good  Judgment:  Good  Impulse Control: Good   Risk Assessment: Danger to Self:  No Self-injurious Behavior: No Danger to Others: No Duty to Warn:no Physical Aggression / Violence:No  Access to Firearms a concern: No  Gang Involvement:No   Subjective: The patient attended an individual therapy session via video visit.  The patient gave verbal consent for the session to be on caregility and she is aware of the limitations of telehealth.  The patient was in her home alone and the therapist was in the office.  The patient presents with a blunted affect and her mood is pleasant.  She appears to be a little less depressed than she was the last time I saw her.  The patient states that she saw her son's psychiatrist and he has agreed to look at her medications as well.  I encouraged her to be honest with him about her depression so that she can get on the right medications for her.  The patient states that she has decided to go ahead and have [place] tested for Alzheimer's.  We talked about her feelings about that she seems to be in a better place with that  and in a more of a place of acceptance.  She states that this weekend is when her grandchildren are coming and she seems excited about that.  She reports that she has been  doing a better job of keeping her thoughts neutral or positive in relation to her son and she seems to be doing a better job of staying in a healthier frame of mind.    Interventions: Cognitive Behavioral Therapy, Assertiveness/Communication, Insight-Oriented, and Interpersonal  Diagnosis:Major depressive disorder, recurrent episode, moderate (HCC)  Generalized anxiety disorder  Plan: Plan of Care: Client Abilities/Strengths  Intelligent, ability for insight, motivated  Client Treatment Preferences  Outpatient Individual therapy  Client Statement of Needs  "I am depressed, I cry every day"  Treatment Level  Outpatient Individual therapy  Symptoms  Depressed or irritable mood.:  (Status: maintained). Diminished interest in or  enjoyment of activities.:  (Status: maintained). Feelings of hopelessness,  worthlessness, or inappropriate guilt (Status: maintained). Lack of energy.:  (Status:maintained). Poor concentration and indecisiveness.:  (Status: maintained). Sleeplessness or hypersomnia.:  (Status:  maintained).  Problems Addressed  Unipolar Depression, Unipolar Depression  Goals 1. Alleviate depressive symptoms and return to previous level of effective  functioning. Objective Describe current and past experiences with depression including their impact on functioning and  attempts to resolve it. Target Date: 04/11/2024 Frequency: Biweekly Progress: 10 Modality: individual Related Interventions 1. Encourage the client to share his/her thoughts and feelings of depression; express empathy and  build rapport while identifying primary cognitive, behavioral, interpersonal, or other  contributors to depression. Objective Identify and replace thoughts and beliefs that support depression. Target Date: 04/11/2024 Frequency: Biweekly  Progress: 10 Modality: individual Related Interventions 1. Facilitate and reinforce the client's shift from biased depressive self-talk and beliefs to  realitybased cognitive messages that enhance self-confidence and increase adaptive actions (see  "Positive Self-Talk" in the Adult Psychotherapy Homework Planner by Stephannie Li). Objective Learn and implement behavioral strategies to overcome depression. Target Date: 04/11/2024 Frequency: Biweekly Progress: On this, 10 Modality: individual Related Interventions 1. Assist the client in developing skills that increase the likelihood of deriving pleasure from  behavioral activation (e.g., assertiveness skills, developing an exercise plan, less internal/more  external focus, increased social involvement); reinforce success. 2. Develop healthy thinking patterns and beliefs about self, others, and the world that lead to the alleviation and help prevent the relapse of  depression. Diagnosis 296.32 (Major depressive affective disorder, recurrent episode, moderate) - Open -  [Signifier: n/a]  F41.1  Generalized Anxiety Disorder Conditions For Discharge Achievement of treatment goals and objectives  Joseff Luckman G Draya Felker, LCSW

## 2023-05-08 ENCOUNTER — Ambulatory Visit: Payer: Medicare Other | Admitting: Psychology

## 2023-05-08 NOTE — Progress Notes (Signed)
No show               Brandi Ramsey [name], LCSW
